# Patient Record
Sex: Male | Born: 1945
Health system: Southern US, Community
[De-identification: ages and names within clinical notes are randomized; demographics above are authoritative.]

## PROBLEM LIST (undated history)

## (undated) DIAGNOSIS — C801 Malignant (primary) neoplasm, unspecified: Secondary | ICD-10-CM

## (undated) DIAGNOSIS — E785 Hyperlipidemia, unspecified: Secondary | ICD-10-CM

## (undated) DIAGNOSIS — R030 Elevated blood-pressure reading, without diagnosis of hypertension: Secondary | ICD-10-CM

## (undated) DIAGNOSIS — E559 Vitamin D deficiency, unspecified: Secondary | ICD-10-CM

## (undated) HISTORY — DX: Hyperlipidemia, unspecified: E78.5

## (undated) HISTORY — DX: Malignant (primary) neoplasm, unspecified: C80.1

## (undated) HISTORY — DX: Vitamin D deficiency, unspecified: E55.9

## (undated) HISTORY — PX: OTHER SURGICAL HISTORY: SHX169

## (undated) HISTORY — DX: Elevated blood-pressure reading, without diagnosis of hypertension: R03.0

---

## 2001-05-09 ENCOUNTER — Emergency Department (HOSPITAL_COMMUNITY): Admission: EM | Admit: 2001-05-09 | Discharge: 2001-05-09 | Payer: Self-pay

## 2004-09-19 ENCOUNTER — Ambulatory Visit: Payer: Self-pay | Admitting: Internal Medicine

## 2004-11-23 ENCOUNTER — Ambulatory Visit: Payer: Self-pay | Admitting: Internal Medicine

## 2008-01-26 ENCOUNTER — Ambulatory Visit: Payer: Self-pay | Admitting: Internal Medicine

## 2008-01-26 DIAGNOSIS — H918X9 Other specified hearing loss, unspecified ear: Secondary | ICD-10-CM

## 2009-10-28 DIAGNOSIS — C61 Malignant neoplasm of prostate: Secondary | ICD-10-CM

## 2009-10-28 HISTORY — DX: Malignant neoplasm of prostate: C61

## 2009-10-28 HISTORY — PX: PROSTATECTOMY: SHX69

## 2010-01-29 ENCOUNTER — Ambulatory Visit: Payer: Self-pay | Admitting: Internal Medicine

## 2010-01-29 DIAGNOSIS — Z87448 Personal history of other diseases of urinary system: Secondary | ICD-10-CM

## 2010-01-29 DIAGNOSIS — H6980 Other specified disorders of Eustachian tube, unspecified ear: Secondary | ICD-10-CM

## 2010-01-29 DIAGNOSIS — R030 Elevated blood-pressure reading, without diagnosis of hypertension: Secondary | ICD-10-CM | POA: Insufficient documentation

## 2010-01-29 DIAGNOSIS — R319 Hematuria, unspecified: Secondary | ICD-10-CM

## 2010-01-29 DIAGNOSIS — H9190 Unspecified hearing loss, unspecified ear: Secondary | ICD-10-CM | POA: Insufficient documentation

## 2010-01-29 LAB — CONVERTED CEMR LAB
Glucose, Urine, Semiquant: NEGATIVE
Nitrite: NEGATIVE
Protein, U semiquant: NEGATIVE
Urobilinogen, UA: 0.2
WBC Urine, dipstick: NEGATIVE

## 2010-01-30 ENCOUNTER — Encounter: Payer: Self-pay | Admitting: Internal Medicine

## 2010-01-31 ENCOUNTER — Encounter: Payer: Self-pay | Admitting: Internal Medicine

## 2010-02-01 LAB — CONVERTED CEMR LAB: PSA: 4.4 ng/mL — ABNORMAL HIGH

## 2010-03-27 ENCOUNTER — Encounter: Payer: Self-pay | Admitting: Internal Medicine

## 2010-03-30 ENCOUNTER — Telehealth (INDEPENDENT_AMBULATORY_CARE_PROVIDER_SITE_OTHER): Payer: Self-pay | Admitting: *Deleted

## 2010-11-27 NOTE — Assessment & Plan Note (Signed)
Summary: TROUBLE URINATING/RH.....   Vital Signs:  Patient profile:   65 year old male Weight:      230 pounds Pulse rate:   72 / minute Resp:     15 per minute BP sitting:   160 / 88  (left arm) Cuff size:   large  Vitals Entered By: Shonna Chock (January 29, 2010 12:20 PM) CC: 1.) For a long time patient with trouble urinating at night off/on  2.) Seen at Urgent Care for cold, had antibiotic-now ear concerns (Left Mainly) Comments REVIEWED MED LIST, PATIENT AGREED DOSE AND INSTRUCTION CORRECT    CC:  1.) For a long time patient with trouble urinating at night off/on  2.) Seen at Urgent Care for cold and had antibiotic-now ear concerns (Left Mainly).  History of Present Illness:    Difficulty with voiding @ night as interrupted flow flow for > 1 year 3-4 X /week. No other prostate symptoms. Promegranate supplement of no benefit. No PMH of GU disease. He rides motorcycles for hours with decreased oral  fluids.                                                                                                                                 Also after URI several weeks ago he has had decreased hearing . Amox X 10 days Rxed @ UC  with resolution of  fever,head purulence but residual hoarseness with decreased hearing.He flys frequently ;this is associated with "bubbling & popping". PMH of ? Meniere's disease as per ENT.  Allergies (verified): No Known Drug Allergies  Review of Systems General:  Denies chills, fever, and sweats. ENT:  Complains of ringing in ears and sinus pressure; denies nasal congestion; No frontal headache , facial pain or purulence. Resp:  Denies cough and sputum productive. GU:  Denies discharge, dysuria, hematuria, incontinence, urinary frequency, and urinary hesitancy; Nocturia 3-3:30 am..  Physical Exam  General:  well-nourished,in no acute distress; alert,appropriate and cooperative throughout examination Ears:  External ear exam shows no significant lesions or  deformities.  Otoscopic examination reveals clear canals, tympanic membranes are intact bilaterally without bulging, retraction, inflammation or discharge. Hearing is grossly normal bilaterally. Nose:  External nasal examination shows no deformity or inflammation. Nasal mucosa are pink and moist without lesions or exudates. Septal dislocation Mouth:  Oral mucosa and oropharynx without lesions or exudates.  Teeth in good repair. Rectal:  external hemorrhoidal tags Prostate:  Upper limits of  gland enlargement and ? soft  nodule R lobe inferiorly Cervical Nodes:  No lymphadenopathy noted. ? 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 ? nodule  Orders: Urology Referral (Urology) Venipuncture [phone]) TLB-PSA (Prostate Specific Antigen) (84153-PSA)  Problem # 5:  HEMATURIA (ICD-599.70)  Microscopically  Orders: Urology Referral (Urology) Venipuncture [phone]) TLB-PSA (Prostate Specific Antigen) (84153-PSA)  Complete Medication List: 1)  Asa 81mg   .... 1 by mouth once daily 2)  Fluticasone Propionate 50 Mcg/act Susp (Fluticasone propionate) .Marland Kitchen.. 1 spray two times a day to l nostril  Other Orders: UA Dipstick w/o Micro (manual) (09811) T-Culture, Urine ([id number])  Patient Instructions: 1)  Check your Blood Pressure regularly. If it is above: 135/85 ON AVERAGE you should make an appointment.Go to Web MD for Eustachian Tube Dysfunction. Prescriptions: FLUTICASONE PROPIONATE 50 MCG/ACT SUSP (FLUTICASONE PROPIONATE) 1 spray two times a day to L nostril  #1 x 5   Entered and Authorized by:   Marga Melnick MD   Signed  by:   Marga Melnick MD on 01/29/2010   Method used:   Print then Give to Patient   RxID:   [id number] TAMSULOSIN HCL 0.4 MG CAPS (TAMSULOSIN HCL) 1 at bedtime  #30 x 0   Entered and Authorized by:   Marga Melnick MD   Signed by:   Marga Melnick MD on 01/29/2010   Method used:   Print then Give to Patient   RxID:   [id number]   Laboratory Results   Urine Tests    Routine Urinalysis   Color: yellow Appearance: Clear Glucose: negative   (Normal Range: Negative) Bilirubin: negative   (Normal Range: Negative) Ketone: negative   (Normal Range: Negative) Spec. Gravity: 1.020   (Normal Range: 1.003-1.035) Blood: large   (Normal Range: Negative) pH: 6.0   (Normal Range: 5.0-8.0) Protein: negative   (Normal Range: Negative) Urobilinogen: 0.2   (Normal Range: 0-1) Nitrite: negative   (Normal Range: Negative) Leukocyte Esterace: negative   (Normal Range: Negative)    Comments: Sent for culture

## 2010-11-27 NOTE — Progress Notes (Signed)
Summary: labwork faxed  Phone Note Call from Patient   Summary of Call: Pt called requesting that his PSA labwork be faxed to him at [phone]. Faxed over, pt aware labs were faxed. Army Fossa CMA  March 30, 2010 10:18 AM

## 2010-11-27 NOTE — Consult Note (Signed)
Summary: [facility]  [facility]   Imported By: Lanelle Bal 02/08/2010 08:36:40  _____________________________________________________________________  External Attachment:    Type:   Image     Comment:   External Document

## 2011-10-01 ENCOUNTER — Other Ambulatory Visit: Payer: Self-pay | Admitting: Internal Medicine

## 2011-10-01 ENCOUNTER — Encounter: Payer: Self-pay | Admitting: Internal Medicine

## 2011-10-01 ENCOUNTER — Ambulatory Visit: Payer: 59

## 2011-10-01 ENCOUNTER — Ambulatory Visit (INDEPENDENT_AMBULATORY_CARE_PROVIDER_SITE_OTHER): Payer: 59 | Admitting: Internal Medicine

## 2011-10-01 VITALS — BP 138/88 | HR 73 | Temp 98.2°F | Wt 223.2 lb

## 2011-10-01 DIAGNOSIS — C61 Malignant neoplasm of prostate: Secondary | ICD-10-CM | POA: Insufficient documentation

## 2011-10-01 DIAGNOSIS — I10 Essential (primary) hypertension: Secondary | ICD-10-CM

## 2011-10-01 DIAGNOSIS — E785 Hyperlipidemia, unspecified: Secondary | ICD-10-CM

## 2011-10-01 DIAGNOSIS — R030 Elevated blood-pressure reading, without diagnosis of hypertension: Secondary | ICD-10-CM

## 2011-10-01 LAB — BASIC METABOLIC PANEL
BUN: 15 mg/dL (ref 6–23)
CO2: 29 mEq/L (ref 19–32)
Chloride: 105 mEq/L (ref 96–112)
Creatinine, Ser: 0.9 mg/dL (ref 0.4–1.5)
Potassium: 4.1 mEq/L (ref 3.5–5.1)

## 2011-10-01 MED ORDER — METOPROLOL TARTRATE 25 MG PO TABS: 25.0000 mg | ORAL_TABLET | Freq: Two times a day (BID) | ORAL | Status: DC

## 2011-10-01 NOTE — Progress Notes (Signed)
Subjective:    Patient ID: Kyle Strickland, male    DOB: 08/06/1946, 65 y.o.   MRN: [id number]  HPI  HYPERTENSION:148/86 on 11/30 @ Pharmacy; he was picking up medications following cauterization for epistaxis. Disease Monitoring  Blood pressure range: not checked until today  Chest pain: no   Dyspnea: no   Claudication: no              Lightheadedness: yes, a few weeks ago with standing or turning body quickly; this resolved after he decreased his intake of candy.   Urinary frequency: no   Edema: no  Medication compliance: no, never on BP meds  Preventitive Healthcare:  Exercise: no   Diet Pattern: decreased calories (except candy as noted)  Salt Restriction: no      Review of Systems   He denies hemoptysis other than during the acute epistaxis. He also denies melena, rectal bleeding, hematuria, abnormal bruising, or difficulty stopping bleeding. He is on a low-dose aspirin daily.     Objective:   Physical Exam Gen.: Healthy and well-nourished in appearance. Alert, appropriate and cooperative throughout exam.  Eyes: No corneal or conjunctival inflammation noted. Pupils equal round reactive to light and accommodation. Fundal exam is benign without hemorrhages, exudate, papilledema. Ears:  Hearing aids bilaterally. Nose: External nasal exam reveals no deformity or inflammation. Nasal mucosa are dry; clot R septum.  Neck: No deformities, masses, or tenderness noted.  Thyroid normal. Lungs: Normal respiratory effort; chest expands symmetrically. Lungs are clear to auscultation without rales, wheezes, or increased work of breathing. Heart: Normal rate and rhythm. Normal S1 and S2. No gallop, click, or rub. S4 w/o  murmur. Abdomen: Bowel sounds normal; abdomen soft and nontender. No masses, organomegaly or hernias noted.No AAA or bruits.                                                                             Musculoskeletal/extremities: No deformity or scoliosis noted of  the  thoracic or lumbar spine but R thoracic muscles > L. No clubbing, cyanosis, edema, or deformity noted. Nail health  good. Vascular: Carotid, radial artery, dorsalis pedis and  posterior tibial pulses are full and equal. No bruits present. Neurologic: Alert and oriented x3. Deep tendon reflexes symmetrical and normal.          Skin: Intact without suspicious lesions or rashes. Lymph: No cervical, axillary  lymphadenopathy present. Psych: Mood and affect are normal. Normally interactive                                                                                         Assessment & Plan:  #1 hypertension, labile  #2 epistaxis, probably related to septal drying rather than #1  #3 dyslipidemia, definitive risk should be established  Plan: See orders and recommendations   EKG is normal with no hypertensive changes or ischemic changes.

## 2011-10-01 NOTE — Patient Instructions (Signed)
Blood Pressure Goal  Ideally is an AVERAGE < 135/85. This AVERAGE should be calculated from @ least 5-7 BP readings taken @ different times of day on different days of week. You should not respond to isolated BP readings , but rather the AVERAGE for that week  Please review Dr Gildardo Griffes book Eat, Drink & Be Healthy for dietary cholesterol information.

## 2011-10-03 LAB — NMR LIPOPROFILE WITH LIPIDS
HDL Particle Number: 27.7 umol/L — ABNORMAL LOW (ref >=30.5)
HDL-C: 52 mg/dL (ref >=40)
LDL (calc): 136 mg/dL — ABNORMAL HIGH (ref <100)
LDL Particle Number: 1598 nmol/L — ABNORMAL HIGH (ref <1000)

## 2011-10-24 ENCOUNTER — Encounter: Payer: Self-pay | Admitting: Internal Medicine

## 2012-09-12 ENCOUNTER — Ambulatory Visit (INDEPENDENT_AMBULATORY_CARE_PROVIDER_SITE_OTHER): Payer: 59 | Admitting: Family Medicine

## 2012-09-12 ENCOUNTER — Encounter: Payer: Self-pay | Admitting: Family Medicine

## 2012-09-12 VITALS — BP 124/80 | HR 73 | Temp 98.2°F | Wt 237.0 lb

## 2012-09-12 DIAGNOSIS — R319 Hematuria, unspecified: Secondary | ICD-10-CM

## 2012-09-12 DIAGNOSIS — N390 Urinary tract infection, site not specified: Secondary | ICD-10-CM | POA: Insufficient documentation

## 2012-09-12 LAB — POCT URINALYSIS DIPSTICK
Bilirubin, UA: NEGATIVE
Glucose, UA: NEGATIVE
Ketones, UA: NEGATIVE
Leukocytes, UA: NEGATIVE

## 2012-09-12 MED ORDER — SULFAMETHOXAZOLE-TRIMETHOPRIM 800-160 MG PO TABS: ORAL_TABLET | ORAL | Status: DC

## 2012-09-12 NOTE — Progress Notes (Signed)
OFFICE NOTE  09/12/2012  CC:  Chief Complaint  Patient presents with  . Dysuria    x 2 weeks  . Hematuria    x 1 day     HPI: Patient is a 66 y.o. Caucasian male who is here for 4-5d of urinary urgency. Notes right pelvic burning a few seconds at the end of each urination.  No obstructive sx's except for this morning briefly and then this was quickly relieved with passing of what he describes as a small blood clot.  No nausea or fever.  No malaise.    Pertinent PMH:  Past Medical History  Diagnosis Date  . Cancer 2011    prostate   Past Surgical History  Procedure Date  . Prostatectomy 2011    Dr Carin Primrose, [place]  Note: Last PSA 0.002 August 2013.  MEDS:  Outpatient Prescriptions Prior to Visit  Medication Sig Dispense Refill  . Cholecalciferol (VITAMIN D3) 1000 UNITS CAPS Take by mouth daily.        . Multiple Vitamin (MULTIVITAMINS PO) Take by mouth daily.        . metoprolol tartrate (LOPRESSOR) 25 MG tablet Take 1 tablet (25 mg total) by mouth 2 (two) times daily.  60 tablet  11   Last reviewed on 09/12/2012 12:49 PM by Jeoffrey Massed, MD  PE: Blood pressure 124/80, pulse 73, temperature 98.2 F (36.8 C), temperature source Oral, weight 237 lb (107.502 kg), SpO2 97.00%. Gen: Alert, well appearing.  Patient is oriented to person, place, time, and situation. CV: RRR, no m/r/g.   LUNGS: CTA bilat, nonlabored resps, good aeration in all lung fields. ABD: soft, NT, ND, BS normal.   LAB: CC UA today showed moderate blood, otherwise normal.  IMPRESSION AND PLAN:  UTI (lower urinary tract infection) Send urine for c/s. Start bactrim DS 1 bid x 5d. Call MD or return if all sx's not resolved after the antibiotics are finished.   An After Visit Summary was printed and given to the patient.  FOLLOW UP: prn

## 2012-09-12 NOTE — Assessment & Plan Note (Signed)
Send urine for c/s. Start bactrim DS 1 bid x 5d. Call MD or return if all sx's not resolved after the antibiotics are finished.

## 2012-09-13 LAB — URINE CULTURE
Colony Count: NO GROWTH
Organism ID, Bacteria: NO GROWTH

## 2013-02-12 ENCOUNTER — Encounter: Payer: Self-pay | Admitting: Internal Medicine

## 2013-02-12 ENCOUNTER — Ambulatory Visit (INDEPENDENT_AMBULATORY_CARE_PROVIDER_SITE_OTHER): Payer: 59 | Admitting: Internal Medicine

## 2013-02-12 VITALS — BP 140/84 | HR 86 | Temp 98.2°F | Ht 70.0 in | Wt 231.0 lb

## 2013-02-12 DIAGNOSIS — Z0000 Encounter for general adult medical examination without abnormal findings: Secondary | ICD-10-CM

## 2013-02-12 DIAGNOSIS — E785 Hyperlipidemia, unspecified: Secondary | ICD-10-CM

## 2013-02-12 DIAGNOSIS — I10 Essential (primary) hypertension: Secondary | ICD-10-CM

## 2013-02-12 LAB — BASIC METABOLIC PANEL
CO2: 27 mEq/L (ref 19–32)
Chloride: 104 mEq/L (ref 96–112)
Potassium: 3.8 mEq/L (ref 3.5–5.1)
Sodium: 137 mEq/L (ref 135–145)

## 2013-02-12 LAB — HEPATIC FUNCTION PANEL
ALT: 33 U/L (ref 0–53)
Albumin: 3.8 g/dL (ref 3.5–5.2)
Alkaline Phosphatase: 87 U/L (ref 39–117)
Total Protein: 7.2 g/dL (ref 6.0–8.3)

## 2013-02-12 LAB — CBC WITH DIFFERENTIAL/PLATELET
Basophils Relative: 0.4 % (ref 0.0–3.0)
Eosinophils Absolute: 0.2 10*3/uL (ref 0.0–0.7)
Eosinophils Relative: 3 % (ref 0.0–5.0)
HCT: 43.5 % (ref 39.0–52.0)
Hemoglobin: 14.6 g/dL (ref 13.0–17.0)
MCHC: 33.5 g/dL (ref 30.0–36.0)
MCV: 93.3 fl (ref 78.0–100.0)
Monocytes Absolute: 1 10*3/uL (ref 0.1–1.0)
Neutro Abs: 3.2 10*3/uL (ref 1.4–7.7)
RBC: 4.66 Mil/uL (ref 4.22–5.81)
WBC: 5.1 10*3/uL (ref 4.5–10.5)

## 2013-02-12 LAB — LIPID PANEL
HDL: 30.2 mg/dL — ABNORMAL LOW (ref >39.00)
Total CHOL/HDL Ratio: 6
Triglycerides: 135 mg/dL (ref 0.0–149.0)

## 2013-02-12 LAB — TSH: TSH: 0.88 u[IU]/mL (ref 0.35–5.50)

## 2013-02-12 NOTE — Progress Notes (Signed)
  Subjective:    Patient ID: Kyle Strickland, male    DOB: 10-Sep-1946, 67 y.o.   MRN: [id number]  HPI  He is here for a physical;acute issues include BPV symptoms.     Review of Systems For approximately a year he's noted brief dizziness while in the right lateral decubitus position in bed. He has no postural symptoms. He denies significant headache, limb weakness, or numbness other than in the toes initially in the morning. That resolves with mobilization.  He wears hearing aids bilaterally; he has chronic tinnitus.  To date he is not had a colonoscopy; he expresses trepidation about the procedure. Standard of care was reviewed. He denies abdominal pain, unexplained weight loss, melena, or rectal bleeding.            Objective:   Physical Exam Gen.:  well-nourished in appearance. Alert, appropriate and cooperative throughout exam.  Head: Normocephalic without obvious abnormalities; patternalopecia  Eyes: No corneal or conjunctival inflammation noted. Pupils equal round reactive to light and accommodation. Fundal exam is benign without hemorrhages, exudate, papilledema. Extraocular motion intact. Vision grossly normal with lenses Ears: External  ear exam reveals no significant lesions or deformities. Canals excessively dry with exfoliation .TMs normal. Hearing is grossly normal bilaterally. Nose: External nasal exam reveals no deformity or inflammation. Nasal mucosa are pink and moist. No lesions or exudates noted. Septum  Slightly dislocated & deviated Mouth: Oral mucosa and oropharynx reveal no lesions or exudates. Teeth in good repair. Neck: No deformities, masses, or tenderness noted. Range of motion & Thyroid normal. Lungs: Normal respiratory effort; chest expands symmetrically. Lungs are clear to auscultation without rales, wheezes, or increased work of breathing. Heart: Normal rate and rhythm. Normal S1 and S2. No gallop, click, or rub. No murmur. Abdomen: Bowel sounds normal;  abdomen soft and nontender. No masses, organomegaly or hernias noted. Genitalia: As per Dr Carin Primrose, [facility]                                 Musculoskeletal/extremities: There is some asymmetry of the posterior thoracic musculature suggesting occult scoliosis. No clubbing, cyanosis, edema, or significant extremity  deformity noted. Range of motion normal .Tone & strength  Normal. Joints normal . Nail health good. Able to lie down & sit up w/o help. Negative SLR bilaterally Vascular: Carotid, radial artery, dorsalis pedis and  posterior tibial pulses are full and equal. No bruits present. Neurologic: Alert and oriented x3. Deep tendon reflexes symmetrical and normal.      Skin: Intact without suspicious lesions or rashes. Lymph: No cervical, axillary lymphadenopathy present. Psych: Mood and affect are normal. Normally interactive                                                                                      Assessment & Plan:  #1 comprehensive physical exam; no acute findings  Plan: see Orders  & Recommendations

## 2013-02-12 NOTE — Progress Notes (Signed)
  Subjective:    Patient ID: Kyle Strickland, male    DOB: Oct 16, 1946, 67 y.o.   MRN: [id number]  HPI    Review of Systems     Objective:   Physical Exam        Assessment & Plan:  BPV Referral to PT if persists or progresses

## 2013-02-12 NOTE — Patient Instructions (Addendum)
Preventive Health Care: Exercise at least 30-45 minutes a day,  3-4 days a week.  Eat a low-fat diet with lots of fruits and vegetables, up to 7-9 servings per day. This would eliminate the need for vitamin supplements. Consume less than 40 grams of sugar (preferably ZERO) per day from foods & drinks with High Fructose Corn Sugar as #1,2,3 or # 4 on label. As per the Standard of Care , screening Colonoscopy recommended @ 50 & every 5-10 years thereafter . More frequent monitor would be dictated by family history or findings @ Colonoscopy. Minimal Blood Pressure Goal= AVERAGE < 140/90;  Ideal is an AVERAGE < 135/85. This AVERAGE should be calculated from @ least 5-7 BP readings taken @ different times of day on different days of week. You should not respond to isolated BP readings , but rather the AVERAGE for that week .Please bring your  blood pressure cuff to office visits to verify that it is reliable.It  can also be checked against the blood pressure device at the pharmacy. Finger or wrist cuffs are not dependable; an arm cuff is. To prevent palpitations or premature beats, avoid stimulants such as decongestants, diet pills, nicotine, or caffeine (coffee, tea, cola, or chocolate) to excess.  If you activate the  My Chart system; lab & Xray results will be released directly  to you as soon as I review & address these through the computer. If you choose not to sign up for My Chart within 36 hours of labs being drawn; results will be reviewed & interpretation added before being copied & mailed, causing a delay in getting the results to you.If you do not receive that report within 7-10 days ,please call. Additionally you can use this system to gain direct  access to your records  if  out of town or @ an office of a  physician who is not in  the My Chart network.  This improves continuity of care & places you in control of your medical record.

## 2013-02-16 ENCOUNTER — Ambulatory Visit: Payer: 59

## 2013-02-16 DIAGNOSIS — R7309 Other abnormal glucose: Secondary | ICD-10-CM

## 2015-05-26 ENCOUNTER — Other Ambulatory Visit (INDEPENDENT_AMBULATORY_CARE_PROVIDER_SITE_OTHER): Payer: 59

## 2015-05-26 ENCOUNTER — Ambulatory Visit (INDEPENDENT_AMBULATORY_CARE_PROVIDER_SITE_OTHER): Payer: 59 | Admitting: Internal Medicine

## 2015-05-26 ENCOUNTER — Encounter: Payer: Self-pay | Admitting: Internal Medicine

## 2015-05-26 VITALS — BP 156/92 | HR 66 | Temp 97.8°F | Resp 16 | Ht 70.5 in | Wt 239.0 lb

## 2015-05-26 DIAGNOSIS — Z0000 Encounter for general adult medical examination without abnormal findings: Secondary | ICD-10-CM

## 2015-05-26 DIAGNOSIS — E785 Hyperlipidemia, unspecified: Secondary | ICD-10-CM

## 2015-05-26 DIAGNOSIS — Z0189 Encounter for other specified special examinations: Secondary | ICD-10-CM

## 2015-05-26 DIAGNOSIS — Z23 Encounter for immunization: Secondary | ICD-10-CM

## 2015-05-26 DIAGNOSIS — R030 Elevated blood-pressure reading, without diagnosis of hypertension: Secondary | ICD-10-CM

## 2015-05-26 DIAGNOSIS — I4510 Unspecified right bundle-branch block: Secondary | ICD-10-CM

## 2015-05-26 LAB — CBC WITH DIFFERENTIAL/PLATELET
BASOS PCT: 0.6 % (ref 0.0–3.0)
Basophils Absolute: 0 10*3/uL (ref 0.0–0.1)
Eosinophils Absolute: 0.3 10*3/uL (ref 0.0–0.7)
Eosinophils Relative: 4.8 % (ref 0.0–5.0)
HCT: 42.2 % (ref 39.0–52.0)
Hemoglobin: 14.5 g/dL (ref 13.0–17.0)
Lymphocytes Relative: 21.4 % (ref 12.0–46.0)
Lymphs Abs: 1.4 10*3/uL (ref 0.7–4.0)
MCHC: 34.4 g/dL (ref 30.0–36.0)
MCV: 92.8 fl (ref 78.0–100.0)
MONO ABS: 1 10*3/uL (ref 0.1–1.0)
MONOS PCT: 15.9 % — AB (ref 3.0–12.0)
NEUTROS ABS: 3.8 10*3/uL (ref 1.4–7.7)
NEUTROS PCT: 57.3 % (ref 43.0–77.0)
Platelets: 206 10*3/uL (ref 150.0–400.0)
RBC: 4.54 Mil/uL (ref 4.22–5.81)
RDW: 13.6 % (ref 11.5–15.5)
WBC: 6.6 10*3/uL (ref 4.0–10.5)

## 2015-05-26 LAB — BASIC METABOLIC PANEL
BUN: 15 mg/dL (ref 6–23)
CHLORIDE: 104 meq/L (ref 96–112)
CO2: 30 mEq/L (ref 19–32)
Calcium: 10.2 mg/dL (ref 8.4–10.5)
Creatinine, Ser: 0.94 mg/dL (ref 0.40–1.50)
GFR: 84.4 mL/min (ref >60.00)
Glucose, Bld: 104 mg/dL — ABNORMAL HIGH (ref 70–99)
Potassium: 4.6 mEq/L (ref 3.5–5.1)
Sodium: 139 mEq/L (ref 135–145)

## 2015-05-26 LAB — HEPATIC FUNCTION PANEL
ALK PHOS: 85 U/L (ref 39–117)
ALT: 24 U/L (ref 0–53)
AST: 17 U/L (ref 0–37)
Albumin: 4 g/dL (ref 3.5–5.2)
Bilirubin, Direct: 0.2 mg/dL (ref 0.0–0.3)
Total Bilirubin: 0.7 mg/dL (ref 0.2–1.2)
Total Protein: 7.2 g/dL (ref 6.0–8.3)

## 2015-05-26 LAB — LIPID PANEL
CHOLESTEROL: 195 mg/dL (ref 0–200)
HDL: 36.6 mg/dL — ABNORMAL LOW (ref >39.00)
LDL CALC: 125 mg/dL — AB (ref 0–99)
NonHDL: 158.56
TRIGLYCERIDES: 168 mg/dL — AB (ref 0.0–149.0)
Total CHOL/HDL Ratio: 5
VLDL: 33.6 mg/dL (ref 0.0–40.0)

## 2015-05-26 LAB — TSH: TSH: 1.43 u[IU]/mL (ref 0.35–4.50)

## 2015-05-26 MED ORDER — METOPROLOL TARTRATE 25 MG PO TABS: 25.0000 mg | ORAL_TABLET | Freq: Two times a day (BID) | ORAL | Status: DC

## 2015-05-26 NOTE — Patient Instructions (Addendum)
Minimal Blood Pressure Goal= AVERAGE < 140/90;  Ideal is an AVERAGE < 135/85. This AVERAGE should be calculated from @ least 5-7 BP readings taken @ different times of day on different days of week. You should not respond to isolated BP readings , but rather the AVERAGE for that week .Please bring your  blood pressure cuff to office visits to verify that it is reliable.It  can also be checked against the blood pressure device at the pharmacy. Finger or wrist cuffs are not dependable; an arm cuff is.  Fill the  prescription for the BP medication if BP NOT @ goal based on  7 to 14 day average.    Cardiovascular exercise, this can be as simple a program as walking, is recommended 30-45 minutes 3-4 times per week. If you're not exercising you should take 6-8 weeks to build up to this level.   Reflux of gastric acid may be asymptomatic as this may occur mainly during sleep.The triggers for reflux  include stress; the "aspirin family" ; alcohol; peppermint; and caffeine (coffee, tea, cola, and chocolate). The aspirin family would include aspirin and the nonsteroidal agents such as ibuprofen &  Naproxen. Tylenol would not cause reflux. If having symptoms ; food & drink should be avoided for @ least 2 hours before going to bed.   As per the Standard of Care , screening Colonoscopy recommended @ 50 & every 5-10 years thereafter . More frequent monitor would be dictated by family history or findings @ Colonoscopy.   Because of risks involved as we discussed;please allow me to refer you to gastroenterology to evaluate the dysphagia and to consider screening colonoscopy.

## 2015-05-26 NOTE — Progress Notes (Signed)
Patient received education resource, including the self-management goal and tool. Patient verbalized understanding. 

## 2015-05-26 NOTE — Progress Notes (Signed)
   Subjective:    Patient ID: Kyle Strickland, male    DOB: 1946/01/20, 69 y.o.   MRN: [id number]  HPI He is here for a physical;acute issues denied.  He never filled the metoprolol. He is not monitoring blood pressure. He does avoid salt but eats fried foods and some red meat. He walks 1 day a week for 35 minutes. With such he does have some calf discomfort occasionally.  He will have 1-2 drinks per month. He's never smoked.  Review of Systems  He occasionally has some calf discomfort with walking.  He also has occasional dysphasia if he eats a sandwich rapidly. This occurs 1-2 times per month.   He also has nocturia once nightly. He has follow up scheduled with his Urologist in the near future.  He has intermittent numbness in toes but no other neurologic symptoms.  Chest pain, palpitations, tachycardia, exertional dyspnea, paroxysmal nocturnal dyspnea, or edema are absent. No unexplained weight loss, abdominal pain, significant dyspepsia,  melena, rectal bleeding, or persistently small caliber stools. Dysuria, pyuria, hematuria, frequency, or polyuria are denied. Change in hair, skin, nails denied. No bowel changes of constipation or . No intolerance to heat or cold.      Objective:   Physical Exam  Pertinent or positive findings include: Pattern alopecia is present. He is wearing hearing aids bilaterally. Ptosis is noted bilaterally. He has a benign nevus over the left forehead. Abdomen is protuberant. He has slight crepitus of the knees.  General appearance :adequately nourished; in no distress. BMI 33.8.  Eyes: No conjunctival inflammation or scleral icterus is present.  Oral exam:  Lips and gums are healthy appearing.There is no oropharyngeal erythema or exudate noted. Dental hygiene is good.  Heart:  Normal rate and regular rhythm. S1 and S2 normal without gallop, murmur, click, rub or other extra sounds    Lungs:Chest clear to auscultation; no wheezes, rhonchi,rales ,or  rubs present.No increased work of breathing.   Abdomen: bowel sounds normal, soft and non-tender without masses, organomegaly or hernias noted.  No guarding or rebound.   Vascular : all pulses equal ; no bruits present.  Skin:Warm & dry.  Intact without suspicious lesions or rashes ; no tenting or jaundice   Lymphatic: No lymphadenopathy is noted about the head, neck, axilla  Neuro: Strength, tone & DTRs normal.        Assessment & Plan:  #1 comprehensive physical exam  #2 hypertension  #3 dysphagia  #4 possible claudication  #5 no colonoscopy to date; [facility] reviewed  Plan: see Orders  & Recommendations

## 2015-05-29 ENCOUNTER — Other Ambulatory Visit (INDEPENDENT_AMBULATORY_CARE_PROVIDER_SITE_OTHER): Payer: 59

## 2015-05-29 DIAGNOSIS — R739 Hyperglycemia, unspecified: Secondary | ICD-10-CM

## 2015-05-29 LAB — HEMOGLOBIN A1C: Hgb A1c MFr Bld: 5.7 % (ref 4.6–6.5)

## 2016-11-18 ENCOUNTER — Ambulatory Visit (INDEPENDENT_AMBULATORY_CARE_PROVIDER_SITE_OTHER): Payer: 59 | Admitting: Nurse Practitioner

## 2016-11-18 ENCOUNTER — Encounter: Payer: Self-pay | Admitting: Nurse Practitioner

## 2016-11-18 VITALS — BP 138/74 | HR 84 | Temp 99.6°F | Ht 71.0 in | Wt 236.0 lb

## 2016-11-18 DIAGNOSIS — J111 Influenza due to unidentified influenza virus with other respiratory manifestations: Secondary | ICD-10-CM

## 2016-11-18 LAB — POCT INFLUENZA A/B
INFLUENZA A, POC: POSITIVE — AB
Influenza B, POC: POSITIVE — AB

## 2016-11-18 MED ORDER — IPRATROPIUM BROMIDE 0.03 % NA SOLN: 2.0000 | Freq: Two times a day (BID) | NASAL | 0 refills | Status: DC

## 2016-11-18 MED ORDER — OSELTAMIVIR PHOSPHATE 75 MG PO CAPS: 75.0000 mg | ORAL_CAPSULE | Freq: Two times a day (BID) | ORAL | 0 refills | Status: DC

## 2016-11-18 MED ORDER — PROMETHAZINE-DM 6.25-15 MG/5ML PO SYRP: 5.0000 mL | ORAL_SOLUTION | Freq: Three times a day (TID) | ORAL | 0 refills | Status: DC | PRN

## 2016-11-18 NOTE — Progress Notes (Signed)
Pre visit review using our clinic review tool, if applicable. No additional management support is needed unless otherwise documented below in the visit note. 

## 2016-11-18 NOTE — Progress Notes (Signed)
Reviewed with patient in office. See office note

## 2016-11-18 NOTE — Progress Notes (Signed)
Subjective:  Patient ID: Kyle Strickland, male    DOB: 1946-01-16  Age: 71 y.o. MRN: [id number]  CC: Cough (cough,bodyaache going on for 2 days. took tylanol PM,oscillococinum and sambucol)   URI   This is a new problem. The current episode started yesterday. The problem has been gradually worsening. The maximum temperature recorded prior to his arrival was 100.4 - 100.9 F. Associated symptoms include congestion, coughing, headaches, joint pain, a plugged ear sensation, rhinorrhea, sinus pain, sneezing, a sore throat and swollen glands. Pertinent negatives include no chest pain, diarrhea, dysuria, ear pain, joint swelling, nausea, neck pain, vomiting or wheezing. He has tried acetaminophen, increased fluids and sleep for the symptoms. The treatment provided no relief.    Outpatient Medications Prior to Visit  Medication Sig Dispense Refill  . aspirin 81 MG tablet Take 81 mg by mouth daily.    . Cholecalciferol (VITAMIN D3) 1000 UNITS CAPS Take by mouth daily.      . fish oil-omega-3 fatty acids 1000 MG capsule Take 1 g by mouth daily.    . Magnesium 400 MG CAPS Take 1 tablet by mouth daily.    . Multiple Vitamin (MULTIVITAMINS PO) Take by mouth daily.      . metoprolol tartrate (LOPRESSOR) 25 MG tablet Take 1 tablet (25 mg total) by mouth 2 (two) times daily. 60 tablet 5   No facility-administered medications prior to visit.     ROS See HPI  Objective:  BP 138/74   Pulse 84   Temp 99.6 F (37.6 C)   Ht 5\' 11"  (1.803 m)   Wt 236 lb (107 kg)   SpO2 96%   BMI 32.92 kg/m   BP Readings from Last 3 Encounters:  11/18/16 138/74  05/26/15 (!) 156/92  02/12/13 140/84    Wt Readings from Last 3 Encounters:  11/18/16 236 lb (107 kg)  05/26/15 239 lb (108.4 kg)  02/12/13 231 lb (104.8 kg)    Physical Exam  Constitutional: He is oriented to person, place, and time. No distress.  HENT:  Right Ear: Tympanic membrane, external ear and ear canal normal.  Left Ear: Tympanic membrane  and ear canal normal.  Nose: Mucosal edema and rhinorrhea present. Right sinus exhibits maxillary sinus tenderness and frontal sinus tenderness. Left sinus exhibits maxillary sinus tenderness and frontal sinus tenderness.  Mouth/Throat: Uvula is midline. Posterior oropharyngeal erythema present. No oropharyngeal exudate.  Eyes: No scleral icterus.  Neck: Normal range of motion. Neck supple.  Cardiovascular: Normal rate and regular rhythm.   Pulmonary/Chest: Effort normal and breath sounds normal.  Lymphadenopathy:    He has no cervical adenopathy.  Neurological: He is alert and oriented to person, place, and time.  Vitals reviewed.   Lab Results  Component Value Date   WBC 6.6 05/26/2015   HGB 14.5 05/26/2015   HCT 42.2 05/26/2015   PLT 206.0 05/26/2015   GLUCOSE 104 (H) 05/26/2015   CHOL 195 05/26/2015   TRIG 168.0 (H) 05/26/2015   HDL 36.60 (L) 05/26/2015   LDLCALC 125 (H) 05/26/2015   ALT 24 05/26/2015   AST 17 05/26/2015   NA 139 05/26/2015   K 4.6 05/26/2015   CL 104 05/26/2015   CREATININE 0.94 05/26/2015   BUN 15 05/26/2015   CO2 30 05/26/2015   TSH 1.43 05/26/2015   PSA 4.40 (H) 01/29/2010   HGBA1C 5.7 05/29/2015    No results found.  Assessment & Plan:   Kyle Strickland was seen today for cough.  Diagnoses and  all orders for this visit:  Influenza -     POCT Influenza A/B -     oseltamivir (TAMIFLU) 75 MG capsule; Take 1 capsule (75 mg total) by mouth 2 (two) times daily. -     promethazine-dextromethorphan (PROMETHAZINE-DM) 6.25-15 MG/5ML syrup; Take 5 mLs by mouth 3 (three) times daily as needed for cough. -     ipratropium (ATROVENT) 0.03 % nasal spray; Place 2 sprays into both nostrils 2 (two) times daily. Do not use for more than 5days.   I am having Kyle Strickland start on oseltamivir, promethazine-dextromethorphan, and ipratropium. I am also having him maintain his Vitamin D3, Multiple Vitamin (MULTIVITAMINS PO), aspirin, fish oil-omega-3 fatty acids, Magnesium,  and metoprolol tartrate.  Meds ordered this encounter  Medications  . oseltamivir (TAMIFLU) 75 MG capsule    Sig: Take 1 capsule (75 mg total) by mouth 2 (two) times daily.    Dispense:  10 capsule    Refill:  0    Order Specific Question:   Supervising Provider    Answer:   Cassandria Anger [[id number]]  . promethazine-dextromethorphan (PROMETHAZINE-DM) 6.25-15 MG/5ML syrup    Sig: Take 5 mLs by mouth 3 (three) times daily as needed for cough.    Dispense:  240 mL    Refill:  0    Order Specific Question:   Supervising Provider    Answer:   Cassandria Anger [[id number]]  . ipratropium (ATROVENT) 0.03 % nasal spray    Sig: Place 2 sprays into both nostrils 2 (two) times daily. Do not use for more than 5days.    Dispense:  30 mL    Refill:  0    Order Specific Question:   Supervising Provider    Answer:   Cassandria Anger [[id number]]    Follow-up: Return if symptoms worsen or fail to improve.  Wilfred Lacy, NP

## 2016-11-18 NOTE — Patient Instructions (Signed)
URI Instructions: Encourage adequate oral hydration.  Use over-the-counter  "cold" medicines  such as "Tylenol cold" , "Advil cold",  "Mucinex" or" Mucinex D"  for cough and congestion.  Avoid decongestants if you have high blood pressure. Use" Delsym" or" Robitussin" cough syrup varietis for cough.  You can use plain "Tylenol" or "Advi"l for fever, chills and achyness.   "Common cold" symptoms are usually triggered by a virus.  The antibiotics are usually not necessary. On average, a" viral cold" illness would take 4-7 days to resolve. Please, make an appointment if you are not better or if you're worse.   Influenza, Adult Influenza, more commonly known as "the flu," is a viral infection that primarily affects the respiratory tract. The respiratory tract includes organs that help you breathe, such as the lungs, nose, and throat. The flu causes many common cold symptoms, as well as a high fever and body aches. The flu spreads easily from person to person (is contagious). Getting a flu shot (influenza vaccination) every year is the best way to prevent influenza. What are the causes? Influenza is caused by a virus. You can catch the virus by:  Breathing in droplets from an infected person's cough or sneeze.  Touching something that was recently contaminated with the virus and then touching your mouth, nose, or eyes. What increases the risk? The following factors may make you more likely to get the flu:  Not cleaning your hands frequently with soap and water or alcohol-based hand sanitizer.  Having close contact with many people during cold and flu season.  Touching your mouth, eyes, or nose without washing or sanitizing your hands first.  Not drinking enough fluids or not eating a healthy diet.  Not getting enough sleep or exercise.  Being under a high amount of stress.  Not getting a yearly (annual) flu shot. You may be at a higher risk of complications from the flu, such as a  severe lung infection (pneumonia), if you:  Are over the age of 68.  Are pregnant.  Have a weakened disease-fighting system (immune system). You may have a weakened immune system if you:  Have HIV or AIDS.  Are undergoing chemotherapy.  Aretaking medicines that reduce the activity of (suppress) the immune system.  Have a long-term (chronic) illness, such as heart disease, kidney disease, diabetes, or lung disease.  Have a liver disorder.  Are obese.  Have anemia. What are the signs or symptoms? Symptoms of this condition typically last 4-10 days and may include:  Fever.  Chills.  Headache, body aches, or muscle aches.  Sore throat.  Cough.  Runny or congested nose.  Chest discomfort and cough.  Poor appetite.  Weakness or tiredness (fatigue).  Dizziness.  Nausea or vomiting. How is this diagnosed? This condition may be diagnosed based on your medical history and a physical exam. Your health care provider may do a nose or throat swab test to confirm the diagnosis. How is this treated? If influenza is detected early, you can be treated with antiviral medicine that can reduce the length of your illness and the severity of your symptoms. This medicine may be given by mouth (orally) or through an IV tube that is inserted in one of your veins. The goal of treatment is to relieve symptoms by taking care of yourself at home. This may include taking over-the-counter medicines, drinking plenty of fluids, and adding humidity to the air in your home. In some cases, influenza goes away on its own. Severe  influenza or complications from influenza may be treated in a hospital. Follow these instructions at home:  Take over-the-counter and prescription medicines only as told by your health care provider.  Use a cool mist humidifier to add humidity to the air in your home. This can make breathing easier.  Rest as needed.  Drink enough fluid to keep your urine clear or pale  yellow.  Cover your mouth and nose when you cough or sneeze.  Wash your hands with soap and water often, especially after you cough or sneeze. If soap and water are not available, use hand sanitizer.  Stay home from work or school as told by your health care provider. Unless you are visiting your health care provider, try to avoid leaving home until your fever has been gone for 24 hours without the use of medicine.  Keep all follow-up visits as told by your health care provider. This is important. How is this prevented?  Getting an annual flu shot is the best way to avoid getting the flu. You may get the flu shot in late summer, fall, or winter. Ask your health care provider when you should get your flu shot.  Wash your hands often or use hand sanitizer often.  Avoid contact with people who are sick during cold and flu season.  Eat a healthy diet, drink plenty of fluids, get enough sleep, and exercise regularly. Contact a health care provider if:  You develop new symptoms.  You have:  Chest pain.  Diarrhea.  A fever.  Your cough gets worse.  You produce more mucus.  You feel nauseous or you vomit. Get help right away if:  You develop shortness of breath or difficulty breathing.  Your skin or nails turn a bluish color.  You have severe pain or stiffness in your neck.  You develop a sudden headache or sudden pain in your face or ear.  You cannot stop vomiting. This information is not intended to replace advice given to you by your health care provider. Make sure you discuss any questions you have with your health care provider. Document Released: 10/11/2000 Document Revised: 03/21/2016 Document Reviewed: 08/08/2015 Elsevier Interactive Patient Education  2017 [employer].

## 2016-12-06 ENCOUNTER — Ambulatory Visit (INDEPENDENT_AMBULATORY_CARE_PROVIDER_SITE_OTHER): Payer: 59 | Admitting: Family

## 2016-12-06 ENCOUNTER — Other Ambulatory Visit (INDEPENDENT_AMBULATORY_CARE_PROVIDER_SITE_OTHER): Payer: 59

## 2016-12-06 ENCOUNTER — Encounter: Payer: Self-pay | Admitting: Family

## 2016-12-06 VITALS — BP 128/82 | HR 66 | Temp 98.2°F | Resp 16 | Ht 71.0 in | Wt 241.0 lb

## 2016-12-06 DIAGNOSIS — Z6830 Body mass index (BMI) 30.0-30.9, adult: Secondary | ICD-10-CM

## 2016-12-06 DIAGNOSIS — E6609 Other obesity due to excess calories: Secondary | ICD-10-CM

## 2016-12-06 DIAGNOSIS — E669 Obesity, unspecified: Secondary | ICD-10-CM | POA: Insufficient documentation

## 2016-12-06 DIAGNOSIS — Z0000 Encounter for general adult medical examination without abnormal findings: Secondary | ICD-10-CM

## 2016-12-06 DIAGNOSIS — Z23 Encounter for immunization: Secondary | ICD-10-CM | POA: Diagnosis not present

## 2016-12-06 LAB — CBC
HEMATOCRIT: 42.8 % (ref 39.0–52.0)
HEMOGLOBIN: 14.6 g/dL (ref 13.0–17.0)
MCHC: 34.1 g/dL (ref 30.0–36.0)
MCV: 92.9 fl (ref 78.0–100.0)
PLATELETS: 222 10*3/uL (ref 150.0–400.0)
RBC: 4.6 Mil/uL (ref 4.22–5.81)
RDW: 12.9 % (ref 11.5–15.5)
WBC: 5.9 10*3/uL (ref 4.0–10.5)

## 2016-12-06 LAB — LIPID PANEL
CHOLESTEROL: 200 mg/dL (ref 0–200)
HDL: 40.7 mg/dL (ref >39.00)
LDL CALC: 120 mg/dL — AB (ref 0–99)
NonHDL: 159.58
TRIGLYCERIDES: 200 mg/dL — AB (ref 0.0–149.0)
Total CHOL/HDL Ratio: 5
VLDL: 40 mg/dL (ref 0.0–40.0)

## 2016-12-06 LAB — COMPREHENSIVE METABOLIC PANEL
ALBUMIN: 4.1 g/dL (ref 3.5–5.2)
ALT: 27 U/L (ref 0–53)
AST: 17 U/L (ref 0–37)
Alkaline Phosphatase: 81 U/L (ref 39–117)
BUN: 16 mg/dL (ref 6–23)
CALCIUM: 9.8 mg/dL (ref 8.4–10.5)
CHLORIDE: 105 meq/L (ref 96–112)
CO2: 29 mEq/L (ref 19–32)
CREATININE: 0.96 mg/dL (ref 0.40–1.50)
GFR: 82.01 mL/min (ref >60.00)
Glucose, Bld: 113 mg/dL — ABNORMAL HIGH (ref 70–99)
POTASSIUM: 4.7 meq/L (ref 3.5–5.1)
Sodium: 139 mEq/L (ref 135–145)
Total Bilirubin: 0.7 mg/dL (ref 0.2–1.2)
Total Protein: 6.8 g/dL (ref 6.0–8.3)

## 2016-12-06 LAB — HEPATITIS C ANTIBODY: HCV Ab: NEGATIVE

## 2016-12-06 LAB — PSA: PSA: 0.01 ng/mL — ABNORMAL LOW (ref 0.10–4.00)

## 2016-12-06 NOTE — Assessment & Plan Note (Signed)
1) Anticipatory Guidance: Discussed importance of wearing a seatbelt while driving and not texting while driving; changing batteries in smoke detector at least once annually; wearing suntan lotion when outside; eating a balanced and moderate diet; getting physical activity at least 30 minutes per day.  2) Immunizations / Screenings / Labs:  Pneumovax updated today. Discussed plan for Zostavax. Declines tetanus. All other immunizations are up-to-date per recommendations. Obtain PSA for prostate cancer screening. Obtain hepatitis C antibody for hepatitis C screening. Due for a dental exam encouraged to be completed independently. All other screenings are up-to-date per recommendations.Otain CBC, CMET, and lipid profile.    Overall well exam with risk factors for cardiovascular disease including hyperlipidemia and obesity. Recommend weight loss of 5-10% of current body weight through nutrition and physical activity. He does walk somewhat work with encouragement for structured exercise program outside of work. He continues to work in [employer]. Continue other healthy lifestyle behaviors and choices. Follow-up prevention exam in 1 year. Follow-up office visit pending blood work for chronic conditions as needed.

## 2016-12-06 NOTE — Assessment & Plan Note (Signed)
BMI of 33. Recommend weight loss of 5-10% of current body weight. Recommend increasing physical activity to 30 minutes of moderate level activity daily. Encourage nutritional intake that focuses on nutrient dense foods and is moderate, varied, and balanced and is low in saturated fats and processed/sugary foods. Continue to monitor.   

## 2016-12-06 NOTE — Patient Instructions (Addendum)
Thank you for choosing [facility].  SUMMARY AND INSTRUCTIONS:  Medication:  Continue to take your medications as prescribed.   Labs:  Please stop by the lab on the lower level of the building for your blood work. Your results will be released to [facility] (or called to you) after review, usually within 72 hours after test completion. If any changes need to be made, you will be notified at that same time.  1.) The lab is open from 7:30am to 5:30 pm Monday-Friday 2.) No appointment is necessary 3.) Fasting (if needed) is 6-8 hours after food and drink; black coffee and water are okay   Follow up:  If your symptoms worsen or fail to improve, please contact our office for further instruction, or in case of emergency go directly to the emergency room at the closest medical facility.    Health Maintenance, Male A healthy lifestyle and preventative care can promote health and wellness.  Maintain regular health, dental, and eye exams.  Eat a healthy diet. Foods like vegetables, fruits, whole grains, low-fat dairy products, and lean protein foods contain the nutrients you need and are low in calories. Decrease your intake of foods high in solid fats, added sugars, and salt. Get information about a proper diet from your health care provider, if necessary.  Regular physical exercise is one of the most important things you can do for your health. Most adults should get at least 150 minutes of moderate-intensity exercise (any activity that increases your heart rate and causes you to sweat) each week. In addition, most adults need muscle-strengthening exercises on 2 or more days a week.   Maintain a healthy weight. The body mass index (BMI) is a screening tool to identify possible weight problems. It provides an estimate of body fat based on height and weight. Your health care provider can find your BMI and can help you achieve or maintain a healthy weight. For males 20 years and older:  A  BMI below 18.5 is considered underweight.  A BMI of 18.5 to 24.9 is normal.  A BMI of 25 to 29.9 is considered overweight.  A BMI of 30 and above is considered obese.  Maintain normal blood lipids and cholesterol by exercising and minimizing your intake of saturated fat. Eat a balanced diet with plenty of fruits and vegetables. Blood tests for lipids and cholesterol should begin at age 18 and be repeated every 5 years. If your lipid or cholesterol levels are high, you are over age 36, or you are at high risk for heart disease, you may need your cholesterol levels checked more frequently.Ongoing high lipid and cholesterol levels should be treated with medicines if diet and exercise are not working.  If you smoke, find out from your health care provider how to quit. If you do not use tobacco, do not start.  Lung cancer screening is recommended for adults aged 23-80 years who are at high risk for developing lung cancer because of a history of smoking. A yearly low-dose CT scan of the lungs is recommended for people who have at least a 30-pack-year history of smoking and are current smokers or have quit within the past 15 years. A pack year of smoking is smoking an average of 1 pack of cigarettes a day for 1 year (for example, a 30-pack-year history of smoking could mean smoking 1 pack a day for 30 years or 2 packs a day for 15 years). Yearly screening should continue until the smoker has stopped  smoking for at least 15 years. Yearly screening should be stopped for people who develop a health problem that would prevent them from having lung cancer treatment.  If you choose to drink alcohol, do not have more than 2 drinks per day. One drink is considered to be 12 oz (360 mL) of beer, 5 oz (150 mL) of wine, or 1.5 oz (45 mL) of liquor.  Avoid the use of street drugs. Do not share needles with anyone. Ask for help if you need support or instructions about stopping the use of drugs.  High blood pressure  causes heart disease and increases the risk of stroke. High blood pressure is more likely to develop in:  People who have blood pressure in the end of the normal range (100-139/85-89 mm Hg).  People who are overweight or obese.  People who are African American.  If you are 70-25 years of age, have your blood pressure checked every 3-5 years. If you are 42 years of age or older, have your blood pressure checked every year. You should have your blood pressure measured twice-once when you are at a hospital or clinic, and once when you are not at a hospital or clinic. Record the average of the two measurements. To check your blood pressure when you are not at a hospital or clinic, you can use:  An automated blood pressure machine at a pharmacy.  A home blood pressure monitor.  If you are 6-22 years old, ask your health care provider if you should take aspirin to prevent heart disease.  Diabetes screening involves taking a blood sample to check your fasting blood sugar level. This should be done once every 3 years after age 65 if you are at a normal weight and without risk factors for diabetes. Testing should be considered at a younger age or be carried out more frequently if you are overweight and have at least 1 risk factor for diabetes.  Colorectal cancer can be detected and often prevented. Most routine colorectal cancer screening begins at the age of 44 and continues through age 75. However, your health care provider may recommend screening at an earlier age if you have risk factors for colon cancer. On a yearly basis, your health care provider may provide home test kits to check for hidden blood in the stool. A small camera at the end of a tube may be used to directly examine the colon (sigmoidoscopy or colonoscopy) to detect the earliest forms of colorectal cancer. Talk to your health care provider about this at age 25 when routine screening begins. A direct exam of the colon should be repeated  every 5-10 years through age 53, unless early forms of precancerous polyps or small growths are found.  People who are at an increased risk for hepatitis B should be screened for this virus. You are considered at high risk for hepatitis B if:  You were born in a country where hepatitis B occurs often. Talk with your health care provider about which countries are considered high risk.  Your parents were born in a high-risk country and you have not received a shot to protect against hepatitis B (hepatitis B vaccine).  You have HIV or AIDS.  You use needles to inject street drugs.  You live with, or have sex with, someone who has hepatitis B.  You are a man who has sex with other men (MSM).  You get hemodialysis treatment.  You take certain medicines for conditions like cancer, organ transplantation,  and autoimmune conditions.  Hepatitis C blood testing is recommended for all people born from [birth year] through 1966 and any individual with known risk factors for hepatitis C.  Healthy men should no longer receive prostate-specific antigen (PSA) blood tests as part of routine cancer screening. Talk to your health care provider about prostate cancer screening.  Testicular cancer screening is not recommended for adolescents or adult males who have no symptoms. Screening includes self-exam, a health care provider exam, and other screening tests. Consult with your health care provider about any symptoms you have or any concerns you have about testicular cancer.  Practice safe sex. Use condoms and avoid high-risk sexual practices to reduce the spread of sexually transmitted infections (STIs).  You should be screened for STIs, including gonorrhea and chlamydia if:  You are sexually active and are younger than 24 years.  You are older than 24 years, and your health care provider tells you that you are at risk for this type of infection.  Your sexual activity has changed since you were last screened,  and you are at an increased risk for chlamydia or gonorrhea. Ask your health care provider if you are at risk.  If you are at risk of being infected with HIV, it is recommended that you take a prescription medicine daily to prevent HIV infection. This is called pre-exposure prophylaxis (PrEP). You are considered at risk if:  You are a man who has sex with other men (MSM).  You are a heterosexual man who is sexually active with multiple partners.  You take drugs by injection.  You are sexually active with a partner who has HIV.  Talk with your health care provider about whether you are at high risk of being infected with HIV. If you choose to begin PrEP, you should first be tested for HIV. You should then be tested every 3 months for as long as you are taking PrEP.  Use sunscreen. Apply sunscreen liberally and repeatedly throughout the day. You should seek shade when your shadow is shorter than you. Protect yourself by wearing long sleeves, pants, a wide-brimmed hat, and sunglasses year round whenever you are outdoors.  Tell your health care provider of new moles or changes in moles, especially if there is a change in shape or color. Also, tell your health care provider if a mole is larger than the size of a pencil eraser.  A one-time screening for abdominal aortic aneurysm (AAA) and surgical repair of large AAAs by ultrasound is recommended for men aged 86-75 years who are current or former smokers.  Stay current with your vaccines (immunizations). This information is not intended to replace advice given to you by your health care provider. Make sure you discuss any questions you have with your health care provider. Document Released: 04/11/2008 Document Revised: 11/04/2014 Document Reviewed: 07/18/2015 Elsevier Interactive Patient Education  2017 [employer].

## 2016-12-06 NOTE — Progress Notes (Signed)
Subjective:    Patient ID: Kyle Strickland, male    DOB: [birth year], 71 y.o.   MRN: [id number]  Chief Complaint  Patient presents with  . Establish Care    wants CPE, fasting    HPI:  Kyle Strickland is a 71 y.o. male who presents today for an annual wellness visit.   1) Health Maintenance -   Diet - Averages about 2 meals per day consisting of a regular diet; Caffeine intake of about 1-2 cups per day.   Exercise - Walks at work with no structured exercise outside work.   2) Preventative Exams / Immunizations:  Dental -- Due for exam  Vision -- Up to date   Health Maintenance  Topic Date Due  . Hepatitis C Screening  01/22/1946  . TETANUS/TDAP  09/08/1964  . COLONOSCOPY  09/09/1995  . ZOSTAVAX  09/08/2005  . PNA vac Low Risk Adult (2 of 2 - PPSV23) 05/25/2016  . INFLUENZA VACCINE  01/25/2017 (Originally 05/28/2016)    Immunization History  Administered Date(s) Administered  . Pneumococcal Conjugate-13 05/26/2015  . Pneumococcal Polysaccharide-23 12/06/2016     Allergies  Allergen Reactions  . Ciprofloxacin     Hives post op  . Ditropan [Oxybutynin Chloride]     Hives 2011, post op  . Codeine     Nausea and headaches      Outpatient Medications Prior to Visit  Medication Sig Dispense Refill  . aspirin 81 MG tablet Take 81 mg by mouth daily.    . Cholecalciferol (VITAMIN D3) 1000 UNITS CAPS Take by mouth daily.      . fish oil-omega-3 fatty acids 1000 MG capsule Take 1 g by mouth daily.    . Magnesium 400 MG CAPS Take 1 tablet by mouth daily.    . Multiple Vitamin (MULTIVITAMINS PO) Take by mouth daily.      Marland Kitchen ipratropium (ATROVENT) 0.03 % nasal spray Place 2 sprays into both nostrils 2 (two) times daily. Do not use for more than 5days. 30 mL 0  . metoprolol tartrate (LOPRESSOR) 25 MG tablet Take 1 tablet (25 mg total) by mouth 2 (two) times daily. 60 tablet 5  . oseltamivir (TAMIFLU) 75 MG capsule Take 1 capsule (75 mg total) by mouth 2 (two) times daily.  10 capsule 0  . promethazine-dextromethorphan (PROMETHAZINE-DM) 6.25-15 MG/5ML syrup Take 5 mLs by mouth 3 (three) times daily as needed for cough. 240 mL 0   No facility-administered medications prior to visit.      Past Medical History:  Diagnosis Date  . Cancer [facility]) 2011   prostate  . Elevated blood-pressure reading without diagnosis of hypertension   . Other and unspecified hyperlipidemia      Past Surgical History:  Procedure Laterality Date  . no colonoscopy     SOC reviewed  . PROSTATECTOMY  2011   Dr Darcus Austin, [facility]     Family History  Problem Relation Age of Onset  . Cancer Maternal Grandfather     bladder  . Diabetes Paternal Grandfather     TIAs; CVA  . Stroke Paternal Grandfather     early 61s  . Heart attack Mother 55  . Transient ischemic attack Paternal Uncle   . Dementia Father     CVAs  . Diabetes Father     borderline     Social History   Social History  . Marital status: Married    Spouse name: N/A  . Number of children: 2  . Years of education:  14   Occupational History  . Not on file.   Social History Main Topics  . Smoking status: Never Smoker  . Smokeless tobacco: Never Used  . Alcohol use Yes     Comment: Rarely  . Drug use: No  . Sexual activity: Not on file   Other Topics Concern  . Not on file   Social History Narrative   Fun: Motorcycles, golf     Review of Systems  Constitutional: Denies fever, chills, fatigue, or significant weight gain/loss. HENT: Head: Denies headache or neck pain Ears: Denies changes in hearing, ringing in ears, earache, drainage Nose: Denies discharge, stuffiness, itching, nosebleed, sinus pain Throat: Denies sore throat, hoarseness, dry mouth, sores, thrush Eyes: Denies loss/changes in vision, pain, redness, blurry/double vision, flashing lights Cardiovascular: Denies chest pain/discomfort, tightness, palpitations, shortness of breath with activity, difficulty lying down, swelling, sudden  awakening with shortness of breath Respiratory: Denies shortness of breath, cough, sputum production, wheezing Gastrointestinal: Denies dysphasia, heartburn, change in appetite, nausea, change in bowel habits, rectal bleeding, constipation, diarrhea, yellow skin or eyes Genitourinary: Denies frequency, urgency, burning/pain, blood in urine, incontinence, change in urinary strength. Musculoskeletal: Denies muscle/joint pain, stiffness, back pain, redness or swelling of joints, trauma Skin: Denies rashes, lumps, itching, dryness, color changes, or hair/nail changes Neurological: Denies dizziness, fainting, seizures, weakness, numbness, tingling, tremor Psychiatric - Denies nervousness, stress, depression or memory loss Endocrine: Denies heat or cold intolerance, sweating, frequent urination, excessive thirst, changes in appetite Hematologic: Denies ease of bruising or bleeding     Objective:     BP 128/82 (BP Location: Left Arm, Patient Position: Sitting, Cuff Size: Large)   Pulse 66   Temp 98.2 F (36.8 C) (Oral)   Resp 16   Ht 5\' 11"  (1.803 m)   Wt 241 lb (109.3 kg)   SpO2 98%   BMI 33.61 kg/m  Nursing note and vital signs reviewed.  Physical Exam  Constitutional: He is oriented to person, place, and time. He appears well-developed and well-nourished.  HENT:  Head: Normocephalic.  Right Ear: Hearing, tympanic membrane, external ear and ear canal normal.  Left Ear: Hearing, tympanic membrane, external ear and ear canal normal.  Nose: Nose normal.  Mouth/Throat: Uvula is midline, oropharynx is clear and moist and mucous membranes are normal.  Eyes: Conjunctivae and EOM are normal. Pupils are equal, round, and reactive to light.  Neck: Neck supple. No JVD present. No tracheal deviation present. No thyromegaly present.  Cardiovascular: Normal rate, regular rhythm, normal heart sounds and intact distal pulses.   Pulmonary/Chest: Effort normal and breath sounds normal.  Abdominal:  Soft. Bowel sounds are normal. He exhibits no distension and no mass. There is no tenderness. There is no rebound and no guarding.  Musculoskeletal: Normal range of motion. He exhibits no edema or tenderness.  Lymphadenopathy:    He has no cervical adenopathy.  Neurological: He is alert and oriented to person, place, and time. He has normal reflexes. No cranial nerve deficit. He exhibits normal muscle tone. Coordination normal.  Skin: Skin is warm and dry.  Psychiatric: He has a normal mood and affect. His behavior is normal. Judgment and thought content normal.       Assessment & Plan:   Problem List Items Addressed This Visit      Other   Routine adult health maintenance - Primary    1) Anticipatory Guidance: Discussed importance of wearing a seatbelt while driving and not texting while driving; changing batteries in smoke detector at  least once annually; wearing suntan lotion when outside; eating a balanced and moderate diet; getting physical activity at least 30 minutes per day.  2) Immunizations / Screenings / Labs:  Pneumovax updated today. Discussed plan for Zostavax. Declines tetanus. All other immunizations are up-to-date per recommendations. Obtain PSA for prostate cancer screening. Obtain hepatitis C antibody for hepatitis C screening. Due for a dental exam encouraged to be completed independently. All other screenings are up-to-date per recommendations.Otain CBC, CMET, and lipid profile.    Overall well exam with risk factors for cardiovascular disease including hyperlipidemia and obesity. Recommend weight loss of 5-10% of current body weight through nutrition and physical activity. He does walk somewhat work with encouragement for structured exercise program outside of work. He continues to work in [employer]. Continue other healthy lifestyle behaviors and choices. Follow-up prevention exam in 1 year. Follow-up office visit pending blood work for chronic conditions as  needed.       Relevant Orders   PSA (Completed)   CBC (Completed)   Comprehensive metabolic panel (Completed)   Lipid panel (Completed)   Hepatitis C antibody (Completed)   Obesity    BMI of 33. Recommend weight loss of 5-10% of current body weight. Recommend increasing physical activity to 30 minutes of moderate level activity daily. Encourage nutritional intake that focuses on nutrient dense foods and is moderate, varied, and balanced and is low in saturated fats and processed/sugary foods. Continue to monitor.         Other Visit Diagnoses    Need for 23-polyvalent pneumococcal polysaccharide vaccine       Relevant Orders   Pneumococcal polysaccharide vaccine 23-valent greater than or equal to 2yo subcutaneous/IM (Completed)       I have discontinued Mr. Scianna metoprolol tartrate, oseltamivir, promethazine-dextromethorphan, and ipratropium. I am also having him maintain his Vitamin D3, Multiple Vitamin (MULTIVITAMINS PO), aspirin, fish oil-omega-3 fatty acids, Magnesium, and cyanocobalamin.   Meds ordered this encounter  Medications  . cyanocobalamin 100 MCG tablet    Sig: Take 100 mcg by mouth daily.     Follow-up: Return in about 1 year (around 12/06/2017), or if symptoms worsen or fail to improve.   Mauricio Po, FNP

## 2016-12-10 ENCOUNTER — Encounter: Payer: Self-pay | Admitting: Family

## 2016-12-18 MED ORDER — ROSUVASTATIN CALCIUM 10 MG PO TABS: 10.0000 mg | ORAL_TABLET | Freq: Every day | ORAL | 2 refills | Status: DC

## 2017-10-10 ENCOUNTER — Encounter: Payer: Self-pay | Admitting: Family Medicine

## 2017-10-10 ENCOUNTER — Ambulatory Visit: Payer: 59 | Admitting: Family Medicine

## 2017-10-10 VITALS — BP 126/80 | HR 67 | Temp 98.4°F | Ht 71.0 in | Wt 238.2 lb

## 2017-10-10 DIAGNOSIS — Z1211 Encounter for screening for malignant neoplasm of colon: Secondary | ICD-10-CM | POA: Insufficient documentation

## 2017-10-10 DIAGNOSIS — H60332 Swimmer's ear, left ear: Secondary | ICD-10-CM | POA: Diagnosis not present

## 2017-10-10 DIAGNOSIS — Z23 Encounter for immunization: Secondary | ICD-10-CM

## 2017-10-10 MED ORDER — NEOMYCIN-POLYMYXIN-HC 3.5-10000-1 OT SUSP: 4.0000 [drp] | Freq: Three times a day (TID) | OTIC | 0 refills | Status: AC

## 2017-10-10 NOTE — Patient Instructions (Addendum)
Colonoscopy, Adult A colonoscopy is an exam to look at the entire large intestine. During the exam, a lubricated, bendable tube is inserted into the anus and then passed into the rectum, colon, and other parts of the large intestine. A colonoscopy is often done as a part of normal colorectal screening or in response to certain symptoms, such as anemia, persistent diarrhea, abdominal pain, and blood in the stool. The exam can help screen for and diagnose medical problems, including:  Tumors.  Polyps.  Inflammation.  Areas of bleeding.  Tell a health care provider about:  Any allergies you have.  All medicines you are taking, including vitamins, herbs, eye drops, creams, and over-the-counter medicines.  Any problems you or family members have had with anesthetic medicines.  Any blood disorders you have.  Any surgeries you have had.  Any medical conditions you have.  Any problems you have had passing stool. What are the risks? Generally, this is a safe procedure. However, problems may occur, including:  Bleeding.  A tear in the intestine.  A reaction to medicines given during the exam.  Infection (rare).  What happens before the procedure? Eating and drinking restrictions Follow instructions from your health care provider about eating and drinking, which may include:  A few days before the procedure - follow a low-fiber diet. Avoid nuts, seeds, dried fruit, raw fruits, and vegetables.  1-3 days before the procedure - follow a clear liquid diet. Drink only clear liquids, such as clear broth or bouillon, black coffee or tea, clear juice, clear soft drinks or sports drinks, gelatin dessert, and popsicles. Avoid any liquids that contain red or purple dye.  On the day of the procedure - do not eat or drink anything during the 2 hours before the procedure, or within the time period that your health care provider recommends.  Bowel prep If you were prescribed an oral bowel prep  to clean out your colon:  Take it as told by your health care provider. Starting the day before your procedure, you will need to drink a large amount of medicated liquid. The liquid will cause you to have multiple loose stools until your stool is almost clear or light green.  If your skin or anus gets irritated from diarrhea, you may use these to relieve the irritation: ? Medicated wipes, such as adult wet wipes with aloe and vitamin E. ? A skin soothing-product like petroleum jelly.  If you vomit while drinking the bowel prep, take a break for up to 60 minutes and then begin the bowel prep again. If vomiting continues and you cannot take the bowel prep without vomiting, call your health care provider.  General instructions  Ask your health care provider about changing or stopping your regular medicines. This is especially important if you are taking diabetes medicines or blood thinners.  Plan to have someone take you home from the hospital or clinic. What happens during the procedure?  An IV tube may be inserted into one of your veins.  You will be given medicine to help you relax (sedative).  To reduce your risk of infection: ? Your health care team will wash or sanitize their hands. ? Your anal area will be washed with soap.  You will be asked to lie on your side with your knees bent.  Your health care provider will lubricate a long, thin, flexible tube. The tube will have a camera and a light on the end.  The tube will be inserted into your   anus.  The tube will be gently eased through your rectum and colon.  Air will be delivered into your colon to keep it open. You may feel some pressure or cramping.  The camera will be used to take images during the procedure.  A small tissue sample may be removed from your body to be examined under a microscope (biopsy). If any potential problems are found, the tissue will be sent to a lab for testing.  If small polyps are found, your  health care provider may remove them and have them checked for cancer cells.  The tube that was inserted into your anus will be slowly removed. The procedure may vary among health care providers and hospitals. What happens after the procedure?  Your blood pressure, heart rate, breathing rate, and blood oxygen level will be monitored until the medicines you were given have worn off.  Do not drive for 24 hours after the exam.  You may have a small amount of blood in your stool.  You may pass gas and have mild abdominal cramping or bloating due to the air that was used to inflate your colon during the exam.  It is up to you to get the results of your procedure. Ask your health care provider, or the department performing the procedure, when your results will be ready. This information is not intended to replace advice given to you by your health care provider. Make sure you discuss any questions you have with your health care provider. Document Released: 10/11/2000 Document Revised: 08/14/2016 Document Reviewed: 12/26/2015 Elsevier Interactive Patient Education  2018 [employer] Drops, Adult You have been diagnosed with a condition that requires you to put drops of medicine into your ears. Ear drops are a medicine that is placed in the ear. This sheet gives you information about how to use ear drops. Your health care provider may also give you more specific instructions. Supplies needed:  Cotton ball.  Medicine. How to put ear drops into your ear 1. Wash your hands thoroughly with soap and water. 2. Make sure your ears are clean and dry. If there is any ear wax or drainage at the outermost portion of the ear canal, wipe it out gently with a cotton-tipped applicator. 3. Warm up the medicine by holding it in the palm of your hand for a few minutes. 4. Shake the medicine if it is a suspension. 5. Use the dropper to draw up the medicine. 6. Hold the dropper above your ear canal and  put the drops in the affected ear as instructed. Do not put the dropper into your ear at any time. It may help to pull the outer flap of the ear up and back while you put the drops in. Doing this will straighten out the ear canal so the medicine can get into the canal easier. 7. To make sure your ear soaks up the medicine, do either of these things: ? Lie down with the affected ear facing up for 10 minutes. This will cause the drops to stay in the ear canal and run down and fill the canal. ? Gently put a cotton ball in your ear canal. Leave enough of the cotton ball out so it can be easily removed. Do not push the cotton ball down into your ear with a cotton-tipped swab or other instrument. You can remove the cotton ball once the medicine has been absorbed. 8. If both ears need the drops, repeat the procedure for the other  ear. Your health care provider will let you know if you need to put drops in both ears. Follow these instructions at home:  Use the ear drops for as long as directed by your health care provider, even if you begin to feel better.  Always wash your hands before and after handling the ear drops.  Keep the ear drops at room temperature.  Keep all follow-up visits as told by your health care provider. This is important. Contact a health care provider if:  Your condition gets worse.  Your pain gets worse.  You notice any unusual drainage from your ear, especially if the drainage has a bad smell.  You have trouble hearing.  You have used the ear drops for the amount of time recommended by your health care provider, but your symptoms have not improved. Get help right away if:  You experience a form of dizziness in which you feel as if the room is spinning and you feel nauseated (vertigo).  The outside of your ear becomes red or swollen.  You develop a severe headache with or without neck stiffness. Summary  Ear drops are a medicine that is placed in the ear.  Put drops  in the affected ear as instructed.  Use the ear drops for as long as directed by your health care provider, even if your symptoms begin to get better.  Keep all follow-up visits as told by your health care provider. This is important. This information is not intended to replace advice given to you by your health care provider. Make sure you discuss any questions you have with your health care provider. Document Released: 10/08/2001 Document Revised: 10/17/2016 Document Reviewed: 10/17/2016 Elsevier Interactive Patient Education  2017 [employer].

## 2017-10-10 NOTE — Progress Notes (Signed)
Subjective:  Patient ID: Kyle Strickland, male    DOB: [birth year]  Age: 71 y.o. MRN: [id number]  CC: left ear pain   HPI Kyle Strickland presents for establishment of care and left ear pain.  He uses hearing application.  His hearing has been somewhat affected.  He did admit using an ear candle 2 weeks ago.  He has had no URI symptoms.  He tried some antibiotic ointment with some relief.  The ear feels feels swollen.  He has never had a colonoscopy because he is terrified of the procedure.  We had a discussion about the procedure and I recommended reassurance.  He agrees to go for consultation.  He will have his flu shot here today.  His LDL cholesterol was found to be in mildly elevated in the past.  A statin was prescribed but he never took it.    History Kyle Strickland has a past medical history of Cancer ([place]) (2011), Elevated blood-pressure reading without diagnosis of hypertension, and Other and unspecified hyperlipidemia.   He has a past surgical history that includes Prostatectomy (2011) and no colonoscopy.   His family history includes Cancer in his maternal grandfather; Dementia in his father; Diabetes in his father and paternal grandfather; Heart attack (age of onset: 81) in his mother; Stroke in his paternal grandfather; Transient ischemic attack in his paternal uncle.He reports that  has never smoked. he has never used smokeless tobacco. He reports that he drinks alcohol. He reports that he does not use drugs.  Outpatient Medications Prior to Visit  Medication Sig Dispense Refill  . aspirin 81 MG tablet Take 81 mg by mouth daily.    . Cholecalciferol (VITAMIN D3) 1000 UNITS CAPS Take by mouth daily.      . cyanocobalamin 100 MCG tablet Take 100 mcg by mouth daily.    . fish oil-omega-3 fatty acids 1000 MG capsule Take 1 g by mouth daily.    . Magnesium 400 MG CAPS Take 1 tablet by mouth daily.    . Multiple Vitamin (MULTIVITAMINS PO) Take by mouth daily.      . rosuvastatin (CRESTOR) 10 MG  tablet Take 1 tablet (10 mg total) by mouth daily. 30 tablet 2   No facility-administered medications prior to visit.     ROS Review of Systems  Constitutional: Negative.  Negative for chills, fatigue and fever.  HENT: Positive for ear discharge and ear pain. Negative for congestion, hearing loss, nosebleeds, postnasal drip, rhinorrhea, sinus pressure and sinus pain.   Eyes: Negative.   Respiratory: Negative.   Cardiovascular: Negative.   Gastrointestinal: Negative.  Negative for abdominal pain, anal bleeding, blood in stool, nausea and vomiting.  Endocrine: Negative for polyphagia and polyuria.  Skin: Negative for rash and wound.  Neurological: Negative for weakness, numbness and headaches.  Hematological: Does not bruise/bleed easily.  Psychiatric/Behavioral: Negative.     Objective:  BP 126/80 (BP Location: Right Arm, Patient Position: Sitting, Cuff Size: Normal)   Pulse 67   Temp 98.4 F (36.9 C) (Oral)   Ht 5\' 11"  (1.803 m)   Wt 238 lb 4 oz (108.1 kg)   SpO2 98%   BMI 33.23 kg/m   Physical Exam  Constitutional: He is oriented to person, place, and time. He appears well-developed and well-nourished. No distress.  HENT:  Head: Normocephalic and atraumatic.  Right Ear: Tympanic membrane, external ear and ear canal normal.  Left Ear: There is swelling. No mastoid tenderness.  Ears:  Mouth/Throat: Oropharynx is clear and moist.  No oropharyngeal exudate.  Eyes: Conjunctivae are normal. Pupils are equal, round, and reactive to light. Right eye exhibits no discharge. Left eye exhibits no discharge. No scleral icterus.  Neck: Neck supple. No JVD present. No tracheal deviation present. No thyromegaly present.  Cardiovascular: Normal rate, regular rhythm and normal heart sounds.  Pulmonary/Chest: Effort normal and breath sounds normal. No stridor.  Lymphadenopathy:    He has no cervical adenopathy.  Neurological: He is alert and oriented to person, place, and time.  Skin:  Skin is warm and dry. He is not diaphoretic.  Psychiatric: He has a normal mood and affect. His behavior is normal.      Assessment & Plan:   Lori was seen today for left ear pain.  Diagnoses and all orders for this visit:  Screen for colon cancer -     Ambulatory referral to Gastroenterology  Acute swimmer's ear of left side -     neomycin-polymyxin-hydrocortisone (CORTISPORIN) 3.5-10000-1 OTIC suspension; Place 4 drops into the left ear 3 (three) times daily for 5 days.  Need for influenza vaccination   I have discontinued Kyle Strickland's rosuvastatin. I am also having him start on neomycin-polymyxin-hydrocortisone. Additionally, I am having him maintain his Vitamin D3, Multiple Vitamin (MULTIVITAMINS PO), aspirin, fish oil-omega-3 fatty acids, Magnesium, and cyanocobalamin.  Meds ordered this encounter  Medications  . neomycin-polymyxin-hydrocortisone (CORTISPORIN) 3.5-10000-1 OTIC suspension    Sig: Place 4 drops into the left ear 3 (three) times daily for 5 days.    Dispense:  10 mL    Refill:  0   Patient did agree to go for colonoscopy consult.  He will follow-up with me soon for a CPE and we will recheck his cholesterol.  Follow-up: Return in about 4 days (around 10/14/2017), or if symptoms worsen or fail to improve.  Libby Maw, MD

## 2017-11-11 ENCOUNTER — Encounter: Payer: Self-pay | Admitting: Family Medicine

## 2017-12-03 ENCOUNTER — Ambulatory Visit: Payer: 59 | Admitting: Family Medicine

## 2017-12-03 ENCOUNTER — Encounter: Payer: Self-pay | Admitting: Family Medicine

## 2017-12-03 VITALS — BP 136/80 | HR 67 | Temp 98.4°F | Ht 71.0 in | Wt 236.1 lb

## 2017-12-03 DIAGNOSIS — J40 Bronchitis, not specified as acute or chronic: Secondary | ICD-10-CM | POA: Diagnosis not present

## 2017-12-03 DIAGNOSIS — J209 Acute bronchitis, unspecified: Secondary | ICD-10-CM | POA: Insufficient documentation

## 2017-12-03 DIAGNOSIS — J440 Chronic obstructive pulmonary disease with acute lower respiratory infection: Secondary | ICD-10-CM

## 2017-12-03 MED ORDER — AMOXICILLIN 500 MG PO CAPS: 500.0000 mg | ORAL_CAPSULE | Freq: Three times a day (TID) | ORAL | 0 refills | Status: DC

## 2017-12-03 MED ORDER — DM-GUAIFENESIN ER 30-600 MG PO TB12: 1.0000 | ORAL_TABLET | Freq: Two times a day (BID) | ORAL | 1 refills | Status: DC | PRN

## 2017-12-03 NOTE — Progress Notes (Signed)
Subjective:  Patient ID: Kyle Strickland, male    DOB: 28-Jul-1946  Age: 73 y.o. MRN: [id number]  CC: Cough and chest cold (started to become bad Wednesday morning, patient had been traveling all last week, sick, sneezing.)   HPI Kyle Strickland presents for evaluation of a 7-day history of malaise.  This started with nausea and diarrhea that is since resolved after a day or 2.  He now has ongoing frontal head pressure with clear rhinorrhea and postnasal drip.  He is currently experiencing a cough that is productive of some yellow phlegm.  He feels a little wheezy but has no history of asthma and has never smoked.  He has tried some over-the-counter cough and cold remedies and sleep.  He has had no fever.  He has had both of his pneumonia vaccines.  Outpatient Medications Prior to Visit  Medication Sig Dispense Refill  . aspirin 81 MG tablet Take 81 mg by mouth daily.    . Cholecalciferol (VITAMIN D3) 1000 UNITS CAPS Take by mouth daily.      . cyanocobalamin 100 MCG tablet Take 100 mcg by mouth daily.    . fish oil-omega-3 fatty acids 1000 MG capsule Take 1 g by mouth daily.    . Magnesium 400 MG CAPS Take 1 tablet by mouth daily.    . Multiple Vitamin (MULTIVITAMINS PO) Take by mouth daily.       No facility-administered medications prior to visit.     ROS Review of Systems  Constitutional: Positive for fatigue. Negative for chills and fever.  HENT: Positive for congestion, postnasal drip, rhinorrhea, sneezing and sore throat. Negative for sinus pressure, trouble swallowing and voice change.   Eyes: Negative for photophobia and visual disturbance.  Respiratory: Positive for cough and wheezing. Negative for chest tightness and shortness of breath.   Cardiovascular: Negative.   Gastrointestinal: Negative for abdominal pain, diarrhea, nausea and vomiting.  Genitourinary: Negative.   Musculoskeletal: Negative for arthralgias and myalgias.  Skin: Negative for color change and rash.    Allergic/Immunologic: Negative for immunocompromised state.  Neurological: Positive for weakness and headaches. Negative for light-headedness.  Hematological: Does not bruise/bleed easily.  Psychiatric/Behavioral: Negative.     Objective:  BP 136/80 (BP Location: Left Arm, Patient Position: Sitting, Cuff Size: Normal)   Pulse 67   Temp 98.4 F (36.9 C) (Oral)   Ht 5\' 11"  (1.803 m)   Wt 236 lb 2 oz (107.1 kg)   SpO2 96%   BMI 32.93 kg/m   BP Readings from Last 3 Encounters:  12/03/17 136/80  10/10/17 126/80  12/06/16 128/82    Wt Readings from Last 3 Encounters:  12/03/17 236 lb 2 oz (107.1 kg)  10/10/17 238 lb 4 oz (108.1 kg)  12/06/16 241 lb (109.3 kg)    Physical Exam  Constitutional: He is oriented to person, place, and time. He appears well-developed and well-nourished. No distress.  HENT:  Head: Normocephalic and atraumatic.  Right Ear: External ear normal.  Left Ear: External ear normal.  Mouth/Throat: Oropharynx is clear and moist. No oropharyngeal exudate.  Eyes: Conjunctivae are normal. Pupils are equal, round, and reactive to light. Right eye exhibits no discharge. Left eye exhibits no discharge. No scleral icterus.  Neck: Neck supple. No JVD present. No tracheal deviation present. No thyromegaly present.  Cardiovascular: Normal rate, regular rhythm and normal heart sounds.  Pulmonary/Chest: Effort normal and breath sounds normal. No stridor. No respiratory distress. He has no wheezes. He has no rales.  Abdominal:  Bowel sounds are normal.  Lymphadenopathy:    He has no cervical adenopathy.  Neurological: He is alert and oriented to person, place, and time.  Skin: Skin is warm and dry. He is not diaphoretic.  Psychiatric: He has a normal mood and affect. His behavior is normal.    Lab Results  Component Value Date   WBC 5.9 12/06/2016   HGB 14.6 12/06/2016   HCT 42.8 12/06/2016   PLT 222.0 12/06/2016   GLUCOSE 113 (H) 12/06/2016   CHOL 200 12/06/2016    TRIG 200.0 (H) 12/06/2016   HDL 40.70 12/06/2016   LDLCALC 120 (H) 12/06/2016   ALT 27 12/06/2016   AST 17 12/06/2016   NA 139 12/06/2016   K 4.7 12/06/2016   CL 105 12/06/2016   CREATININE 0.96 12/06/2016   BUN 16 12/06/2016   CO2 29 12/06/2016   TSH 1.43 05/26/2015   PSA 0.01 (L) 12/06/2016   HGBA1C 5.7 05/29/2015    No results found.  Assessment & Plan:   Kyle Strickland was seen today for cough and chest cold.  Diagnoses and all orders for this visit:  Bronchitis -     amoxicillin (AMOXIL) 500 MG capsule; Take 1 capsule (500 mg total) by mouth 3 (three) times daily. -     dextromethorphan-guaiFENesin (MUCINEX DM) 30-600 MG 12hr tablet; Take 1 tablet by mouth 2 (two) times daily as needed for cough.   I am having Kyle Strickland start on amoxicillin and dextromethorphan-guaiFENesin. I am also having him maintain his Vitamin D3, Multiple Vitamin (MULTIVITAMINS PO), aspirin, fish oil-omega-3 fatty acids, Magnesium, and cyanocobalamin.  Meds ordered this encounter  Medications  . amoxicillin (AMOXIL) 500 MG capsule    Sig: Take 1 capsule (500 mg total) by mouth 3 (three) times daily.    Dispense:  30 capsule    Refill:  0  . dextromethorphan-guaiFENesin (MUCINEX DM) 30-600 MG 12hr tablet    Sig: Take 1 tablet by mouth 2 (two) times daily as needed for cough.    Dispense:  20 tablet    Refill:  1     Follow-up: No Follow-up on file.  Libby Maw, MD

## 2018-07-14 ENCOUNTER — Encounter: Payer: Self-pay | Admitting: Family Medicine

## 2018-07-14 ENCOUNTER — Ambulatory Visit (INDEPENDENT_AMBULATORY_CARE_PROVIDER_SITE_OTHER): Payer: 59 | Admitting: Family Medicine

## 2018-07-14 VITALS — BP 128/80 | HR 71 | Ht 71.0 in | Wt 236.0 lb

## 2018-07-14 DIAGNOSIS — Z0001 Encounter for general adult medical examination with abnormal findings: Secondary | ICD-10-CM

## 2018-07-14 DIAGNOSIS — G629 Polyneuropathy, unspecified: Secondary | ICD-10-CM

## 2018-07-14 DIAGNOSIS — C44311 Basal cell carcinoma of skin of nose: Secondary | ICD-10-CM | POA: Diagnosis not present

## 2018-07-14 DIAGNOSIS — C61 Malignant neoplasm of prostate: Secondary | ICD-10-CM

## 2018-07-14 DIAGNOSIS — Z6830 Body mass index (BMI) 30.0-30.9, adult: Secondary | ICD-10-CM

## 2018-07-14 DIAGNOSIS — E6609 Other obesity due to excess calories: Secondary | ICD-10-CM

## 2018-07-14 DIAGNOSIS — Z1211 Encounter for screening for malignant neoplasm of colon: Secondary | ICD-10-CM

## 2018-07-14 DIAGNOSIS — E559 Vitamin D deficiency, unspecified: Secondary | ICD-10-CM | POA: Diagnosis not present

## 2018-07-14 LAB — URINALYSIS, ROUTINE W REFLEX MICROSCOPIC
BILIRUBIN URINE: NEGATIVE
Hgb urine dipstick: NEGATIVE
KETONES UR: NEGATIVE
LEUKOCYTES UA: NEGATIVE
NITRITE: NEGATIVE
PH: 5.5 (ref 5.0–8.0)
RBC / HPF: NONE SEEN (ref 0–?)
Specific Gravity, Urine: >=1.03 — AB (ref 1.000–1.030)
Total Protein, Urine: NEGATIVE
URINE GLUCOSE: NEGATIVE
Urobilinogen, UA: 0.2 (ref 0.0–1.0)

## 2018-07-14 LAB — COMPREHENSIVE METABOLIC PANEL
ALBUMIN: 4 g/dL (ref 3.5–5.2)
ALT: 22 U/L (ref 0–53)
AST: 17 U/L (ref 0–37)
Alkaline Phosphatase: 79 U/L (ref 39–117)
BILIRUBIN TOTAL: 0.6 mg/dL (ref 0.2–1.2)
BUN: 20 mg/dL (ref 6–23)
CALCIUM: 9.9 mg/dL (ref 8.4–10.5)
CHLORIDE: 107 meq/L (ref 96–112)
CO2: 23 mEq/L (ref 19–32)
CREATININE: 0.92 mg/dL (ref 0.40–1.50)
GFR: 85.75 mL/min (ref >60.00)
Glucose, Bld: 124 mg/dL — ABNORMAL HIGH (ref 70–99)
Potassium: 4.4 mEq/L (ref 3.5–5.1)
SODIUM: 140 meq/L (ref 135–145)
TOTAL PROTEIN: 6.8 g/dL (ref 6.0–8.3)

## 2018-07-14 LAB — LIPID PANEL
CHOLESTEROL: 187 mg/dL (ref 0–200)
HDL: 38 mg/dL — ABNORMAL LOW (ref >39.00)
LDL CALC: 127 mg/dL — AB (ref 0–99)
NonHDL: 149.34
TRIGLYCERIDES: 110 mg/dL (ref 0.0–149.0)
Total CHOL/HDL Ratio: 5
VLDL: 22 mg/dL (ref 0.0–40.0)

## 2018-07-14 LAB — CBC
HCT: 42.3 % (ref 39.0–52.0)
HEMOGLOBIN: 14.7 g/dL (ref 13.0–17.0)
MCHC: 34.7 g/dL (ref 30.0–36.0)
MCV: 91.9 fl (ref 78.0–100.0)
PLATELETS: 209 10*3/uL (ref 150.0–400.0)
RBC: 4.61 Mil/uL (ref 4.22–5.81)
RDW: 13.1 % (ref 11.5–15.5)
WBC: 5.7 10*3/uL (ref 4.0–10.5)

## 2018-07-14 LAB — VITAMIN D 25 HYDROXY (VIT D DEFICIENCY, FRACTURES): VITD: 41.25 ng/mL (ref 30.00–100.00)

## 2018-07-14 LAB — PSA: PSA: 0.02 ng/mL — ABNORMAL LOW (ref 0.10–4.00)

## 2018-07-14 LAB — VITAMIN B12: Vitamin B-12: 549 pg/mL (ref 211–911)

## 2018-07-14 LAB — TSH: TSH: 1.73 u[IU]/mL (ref 0.35–4.50)

## 2018-07-14 NOTE — Patient Instructions (Signed)
Cancer Screening for Men A cancer screening is a test or exam that checks for cancer. Your health care provider will recommend specific cancer screenings based on your age, personal history, and family history of cancer. Work with your health care provider to create a cancer screening schedule that protects your health. Why is cancer screening done? Cancer screening is done to look for cancer in the very early stages, before it spreads and becomes harder to treat and before you would start to notice symptoms. Finding cancer early improves the chances of successful treatment. It may save your life. Who should be screened for cancer? All men should be screened for colorectal cancer and skin cancer. Your health care provider may recommend screenings for other types of cancer if:  You had cancer before.  You have a family member with cancer.  You have abnormal genes that could increase the risk of cancer.  You have risk factors for certain cancers, such as smoking.  When you should be screened for cancer depends on:  Your age.  Your medical history and your family's medical history.  Certain lifestyle factors, such as smoking.  Environmental exposure, such as to asbestos.  What are some common cancer screenings? Lung cancer Lung cancer screening is done with a CT scan that looks for abnormal cells in the lungs. Discuss lung cancer screening with your health care provider if you are 30-18 years old and if any of the following apply to you:  You currently smoke.  You used to smoke heavily.  You have had at least a 30-pack-year smoking history.  You have quit smoking within the past 15 years.  If you smoke heavily or if you used to smoke, you may need to be screened every year. Prostate cancer Prostate cancer screening is done with blood tests and an exam in which a health care provider uses a gloved finger to check prostate size (digital rectal exam). You may need to be screened for  prostate cancer if:  You have risk factors of prostate cancer, such as being African American or having a close family member with prostate cancer.  You have inherited gene changes or a genetic condition, including BRCA1 or BRCA2 gene mutations or Lynch syndrome.  You have symptoms of prostate cancer, such as problems urinating or erectile dysfunction.  Prostate cancer screening for men with average risk may start at age 107. Men with risk factors may need to be screened earlier at age 56-45. Once you have been screened for prostate cancer, future screening may be recommended based on the results of your blood tests. Colorectal cancer Screening for colorectal cancer is recommended starting at age 23 for most men. If you have a family history of colon or rectal cancer or other risk factors, you may need to start having screenings earlier. Talk with your health care provider about which screening test is right for you and how often you should be screened. Colorectal cancer screening looks for cancer or for growths called polyps that often form before cancer starts. Tests to look for cancer or polyps include:  Colonoscopy or flexible sigmoidoscopy. For these procedures, a flexible tube with a small camera is inserted into the rectum.  CT colonography. This test uses X-rays and a contrast dye to check the colon for polyps. If a polyp is found, you may need to have a colonoscopy so the polyp can be located and removed.  Tests to look for cancer in the stool (feces) include:  Guaiac-based fecal  occult blood test (FOBT). This test detects blood in stool. It can be done at home with a kit.  Fecal immunochemical test (FIT). This test detects blood in stool. For this test, you will need to collect stool samples at home.  Stool DNA test. This test looks for blood in stool and any changes in DNA that can lead to colon cancer. For this test, you will need to collect a stool sample at home and send it to a  lab.  Skin cancer Skin cancer screening is done by checking the skin for unusual moles or spots and any changes in existing moles. Your health care provider should check your skin for signs of skin cancer at every physical exam. You should check your skin every month and tell your health care provider right away if anything looks unusual. Men with a higher-than-normal risk for skin cancer may want to see a skin specialist (dermatologist) for an annual body check. Where to find more information:  [place]: SkinPromotion.no  Centers for Disease Control and Prevention: http://knight-sullivan.biz/  American Cancer Society: https://www.cancer.org/latest-news/4-cancer-screening-tests-for-men.html Contact a health care provider if:  You have concerns about any signs or symptoms of cancer, such as: ? Moles that have an unusual shape or color. ? Changes in existing moles. ? A sore on your skin that does not heal. ? Blood in your urine or stool. ? Fatigue that does not go away. ? Frequent pain or cramping in your abdomen. ? Coughing or trouble breathing that does not go away. ? Coughing up blood. ? Losing weight without trying. ? Changes in urination habits. ? Painful urination or ejaculation. Summary  Be aware of and watch for signs and symptoms of cancer, especially symptoms of lung cancer, prostate cancer, colorectal cancer, and skin cancer.  Early detection of cancer with cancer screening may save your life.  Talk with your health care provider about your specific cancer risks.  Work together with your health care provider to create a cancer screening plan that is right for you. This information is not intended to replace advice given to you by your health care provider. Make sure you discuss any questions you have with your health care provider. Document Released: 07/11/2016 Document Revised: 07/11/2016 Document  Reviewed: 07/11/2016 Elsevier Interactive Patient Education  [employer].  Colonoscopy, Adult A colonoscopy is an exam to look at the entire large intestine. During the exam, a lubricated, bendable tube is inserted into the anus and then passed into the rectum, colon, and other parts of the large intestine. A colonoscopy is often done as a part of normal colorectal screening or in response to certain symptoms, such as anemia, persistent diarrhea, abdominal pain, and blood in the stool. The exam can help screen for and diagnose medical problems, including:  Tumors.  Polyps.  Inflammation.  Areas of bleeding.  Tell a health care provider about:  Any allergies you have.  All medicines you are taking, including vitamins, herbs, eye drops, creams, and over-the-counter medicines.  Any problems you or family members have had with anesthetic medicines.  Any blood disorders you have.  Any surgeries you have had.  Any medical conditions you have.  Any problems you have had passing stool. What are the risks? Generally, this is a safe procedure. However, problems may occur, including:  Bleeding.  A tear in the intestine.  A reaction to medicines given during the exam.  Infection (rare).  What happens before the procedure? Eating and drinking restrictions Follow instructions  from your health care provider about eating and drinking, which may include:  A few days before the procedure - follow a low-fiber diet. Avoid nuts, seeds, dried fruit, raw fruits, and vegetables.  1-3 days before the procedure - follow a clear liquid diet. Drink only clear liquids, such as clear broth or bouillon, black coffee or tea, clear juice, clear soft drinks or sports drinks, gelatin dessert, and popsicles. Avoid any liquids that contain red or purple dye.  On the day of the procedure - do not eat or drink anything during the 2 hours before the procedure, or within the time period that your  health care provider recommends.  Bowel prep If you were prescribed an oral bowel prep to clean out your colon:  Take it as told by your health care provider. Starting the day before your procedure, you will need to drink a large amount of medicated liquid. The liquid will cause you to have multiple loose stools until your stool is almost clear or light green.  If your skin or anus gets irritated from diarrhea, you may use these to relieve the irritation: ? Medicated wipes, such as adult wet wipes with aloe and vitamin E. ? A skin soothing-product like petroleum jelly.  If you vomit while drinking the bowel prep, take a break for up to 60 minutes and then begin the bowel prep again. If vomiting continues and you cannot take the bowel prep without vomiting, call your health care provider.  General instructions  Ask your health care provider about changing or stopping your regular medicines. This is especially important if you are taking diabetes medicines or blood thinners.  Plan to have someone take you home from the hospital or clinic. What happens during the procedure?  An IV tube may be inserted into one of your veins.  You will be given medicine to help you relax (sedative).  To reduce your risk of infection: ? Your health care team will wash or sanitize their hands. ? Your anal area will be washed with soap.  You will be asked to lie on your side with your knees bent.  Your health care provider will lubricate a long, thin, flexible tube. The tube will have a camera and a light on the end.  The tube will be inserted into your anus.  The tube will be gently eased through your rectum and colon.  Air will be delivered into your colon to keep it open. You may feel some pressure or cramping.  The camera will be used to take images during the procedure.  A small tissue sample may be removed from your body to be examined under a microscope (biopsy). If any potential problems are  found, the tissue will be sent to a lab for testing.  If small polyps are found, your health care provider may remove them and have them checked for cancer cells.  The tube that was inserted into your anus will be slowly removed. The procedure may vary among health care providers and hospitals. What happens after the procedure?  Your blood pressure, heart rate, breathing rate, and blood oxygen level will be monitored until the medicines you were given have worn off.  Do not drive for 24 hours after the exam.  You may have a small amount of blood in your stool.  You may pass gas and have mild abdominal cramping or bloating due to the air that was used to inflate your colon during the exam.  It is up  to you to get the results of your procedure. Ask your health care provider, or the department performing the procedure, when your results will be ready. This information is not intended to replace advice given to you by your health care provider. Make sure you discuss any questions you have with your health care provider. Document Released: 10/11/2000 Document Revised: 08/14/2016 Document Reviewed: 12/26/2015 Elsevier Interactive Patient Education  2018 [employer].  Exercising to [employer] Exercising can help you to lose weight. In order to lose weight through exercise, you need to do vigorous-intensity exercise. You can tell that you are exercising with vigorous intensity if you are breathing very hard and fast and cannot hold a conversation while exercising. Moderate-intensity exercise helps to maintain your current weight. You can tell that you are exercising at a moderate level if you have a higher heart rate and faster breathing, but you are still able to hold a conversation. How often should I exercise? Choose an activity that you enjoy and set realistic goals. Your health care provider can help you to make an activity plan that works for you. Exercise regularly as directed by your  health care provider. This may include:  Doing resistance training twice each week, such as: ? Push-ups. ? Sit-ups. ? Lifting weights. ? Using resistance bands.  Doing a given intensity of exercise for a given amount of time. Choose from these options: ? 150 minutes of moderate-intensity exercise every week. ? 75 minutes of vigorous-intensity exercise every week. ? A mix of moderate-intensity and vigorous-intensity exercise every week.  Children, pregnant women, people who are out of shape, people who are overweight, and older adults may need to consult a health care provider for individual recommendations. If you have any sort of medical condition, be sure to consult your health care provider before starting a new exercise program. What are some activities that can help me to lose weight?  Walking at a rate of at least 4.5 miles an hour.  Jogging or running at a rate of 5 miles per hour.  Biking at a rate of at least 10 miles per hour.  Lap swimming.  Roller-skating or in-line skating.  Cross-country skiing.  Vigorous competitive sports, such as football, basketball, and soccer.  Jumping rope.  Aerobic dancing. How can I be more active in my day-to-day activities?  Use the stairs instead of the elevator.  Take a walk during your lunch break.  If you drive, park your car farther away from work or school.  If you take public transportation, get off one stop early and walk the rest of the way.  Make all of your phone calls while standing up and walking around.  Get up, stretch, and walk around every 30 minutes throughout the day. What guidelines should I follow while exercising?  Do not exercise so much that you hurt yourself, feel dizzy, or get very short of breath.  Consult your health care provider prior to starting a new exercise program.  Wear comfortable clothes and shoes with good support.  Drink plenty of water while you exercise to prevent dehydration or  heat stroke. Body water is lost during exercise and must be replaced.  Work out until you breathe faster and your heart beats faster. This information is not intended to replace advice given to you by your health care provider. Make sure you discuss any questions you have with your health care provider. Document Released: 11/16/2010 Document Revised: 03/21/2016 Document Reviewed: 03/17/2014 Elsevier Interactive Patient Education  2018 [place] Maintenance, Male A healthy lifestyle and preventive care is important for your health and wellness. Ask your health care provider about what schedule of regular examinations is right for you. What should I know about weight and diet? Eat a Healthy Diet  Eat plenty of vegetables, fruits, whole grains, low-fat dairy products, and lean protein.  Do not eat a lot of foods high in solid fats, added sugars, or salt.  Maintain a Healthy Weight Regular exercise can help you achieve or maintain a healthy weight. You should:  Do at least 150 minutes of exercise each week. The exercise should increase your heart rate and make you sweat (moderate-intensity exercise).  Do strength-training exercises at least twice a week.  Watch Your Levels of Cholesterol and Blood Lipids  Have your blood tested for lipids and cholesterol every 5 years starting at 73 years of age. If you are at high risk for heart disease, you should start having your blood tested when you are 73 years old. You may need to have your cholesterol levels checked more often if: ? Your lipid or cholesterol levels are high. ? You are older than 73 years of age. ? You are at high risk for heart disease.  What should I know about cancer screening? Many types of cancers can be detected early and may often be prevented. Lung Cancer  You should be screened every year for lung cancer if: ? You are a current smoker who has smoked for at least 30 years. ? You are a former smoker who has  quit within the past 15 years.  Talk to your health care provider about your screening options, when you should start screening, and how often you should be screened.  Colorectal Cancer  Routine colorectal cancer screening usually begins at 73 years of age and should be repeated every 5-10 years until you are 73 years old. You may need to be screened more often if early forms of precancerous polyps or small growths are found. Your health care provider may recommend screening at an earlier age if you have risk factors for colon cancer.  Your health care provider may recommend using home test kits to check for hidden blood in the stool.  A small camera at the end of a tube can be used to examine your colon (sigmoidoscopy or colonoscopy). This checks for the earliest forms of colorectal cancer.  Prostate and Testicular Cancer  Depending on your age and overall health, your health care provider may do certain tests to screen for prostate and testicular cancer.  Talk to your health care provider about any symptoms or concerns you have about testicular or prostate cancer.  Skin Cancer  Check your skin from head to toe regularly.  Tell your health care provider about any new moles or changes in moles, especially if: ? There is a change in a mole's size, shape, or color. ? You have a mole that is larger than a pencil eraser.  Always use sunscreen. Apply sunscreen liberally and repeat throughout the day.  Protect yourself by wearing long sleeves, pants, a wide-brimmed hat, and sunglasses when outside.  What should I know about heart disease, diabetes, and high blood pressure?  If you are 49-49 years of age, have your blood pressure checked every 3-5 years. If you are 65 years of age or older, have your blood pressure checked every year. You should have your blood pressure measured twice-once when you are at a hospital or  clinic, and once when you are not at a hospital or clinic. Record the  average of the two measurements. To check your blood pressure when you are not at a hospital or clinic, you can use: ? An automated blood pressure machine at a pharmacy. ? A home blood pressure monitor.  Talk to your health care provider about your target blood pressure.  If you are between 110-26 years old, ask your health care provider if you should take aspirin to prevent heart disease.  Have regular diabetes screenings by checking your fasting blood sugar level. ? If you are at a normal weight and have a low risk for diabetes, have this test once every three years after the age of 69. ? If you are overweight and have a high risk for diabetes, consider being tested at a younger age or more often.  A one-time screening for abdominal aortic aneurysm (AAA) by ultrasound is recommended for men aged 17-75 years who are current or former smokers. What should I know about preventing infection? Hepatitis B If you have a higher risk for hepatitis B, you should be screened for this virus. Talk with your health care provider to find out if you are at risk for hepatitis B infection. Hepatitis C Blood testing is recommended for:  Everyone born from [birth year] through 1966.  Anyone with known risk factors for hepatitis C.  Sexually Transmitted Diseases (STDs)  You should be screened each year for STDs including gonorrhea and chlamydia if: ? You are sexually active and are younger than 73 years of age. ? You are older than 73 years of age and your health care provider tells you that you are at risk for this type of infection. ? Your sexual activity has changed since you were last screened and you are at an increased risk for chlamydia or gonorrhea. Ask your health care provider if you are at risk.  Talk with your health care provider about whether you are at high risk of being infected with HIV. Your health care provider may recommend a prescription medicine to help prevent HIV infection.  What else can  I do?  Schedule regular health, dental, and eye exams.  Stay current with your vaccines (immunizations).  Do not use any tobacco products, such as cigarettes, chewing tobacco, and e-cigarettes. If you need help quitting, ask your health care provider.  Limit alcohol intake to no more than 2 drinks per day. One drink equals 12 ounces of beer, 5 ounces of wine, or 1 ounces of hard liquor.  Do not use street drugs.  Do not share needles.  Ask your health care provider for help if you need support or information about quitting drugs.  Tell your health care provider if you often feel depressed.  Tell your health care provider if you have ever been abused or do not feel safe at home. This information is not intended to replace advice given to you by your health care provider. Make sure you discuss any questions you have with your health care provider. Document Released: 04/11/2008 Document Revised: 06/12/2016 Document Reviewed: 07/18/2015 Elsevier Interactive Patient Education  [employer].

## 2018-07-14 NOTE — Progress Notes (Signed)
Subjective:  Patient ID: Kyle Strickland, male    DOB: 1945-11-12  Age: 72 y.o. MRN: [id number]  CC: Annual Exam   HPI Kyle Strickland presents for fasting for complete physical exam.  He lives with his wife and continues to work every day at his job.  He rarely drinks alcohol and does not smoke or use illicit drugs.  He does not engage in regular exercise.  He rides motorcycles and plays golf for relaxation.  He is status post radical prostatectomy 6 years ago for prostate cancer.  He has done well since this procedure with minimal notes and ED issues.  He is currently experiencing some intermittent numbness in the toes of both of his feet.  Denies specific back or leg pain.  No history of anemia or diabetes.  He is supplementing with vitamin B12.  He is taking supplemental vitamin D for vitamin D deficiency.  He has never had a colonoscopy and is worried about having this procedure done.  He has tried to have the Cologuard done in the past but his insurance would not pay for it he tells me.  He did agree to revisit this option.  Outpatient Medications Prior to Visit  Medication Sig Dispense Refill  . aspirin 81 MG tablet Take 81 mg by mouth daily.    . Cholecalciferol (VITAMIN D3) 1000 UNITS CAPS Take by mouth daily.      . cyanocobalamin 100 MCG tablet Take 100 mcg by mouth daily.    . fish oil-omega-3 fatty acids 1000 MG capsule Take 1 g by mouth daily.    . Magnesium 400 MG CAPS Take 1 tablet by mouth daily.    . Multiple Vitamin (MULTIVITAMINS PO) Take by mouth daily.      Marland Kitchen amoxicillin (AMOXIL) 500 MG capsule Take 1 capsule (500 mg total) by mouth 3 (three) times daily. 30 capsule 0  . dextromethorphan-guaiFENesin (MUCINEX DM) 30-600 MG 12hr tablet Take 1 tablet by mouth 2 (two) times daily as needed for cough. 20 tablet 1   No facility-administered medications prior to visit.     ROS Review of Systems  Constitutional: Negative for chills, fatigue, fever and unexpected weight change.    Eyes: Negative for photophobia and visual disturbance.  Respiratory: Negative.   Cardiovascular: Negative.   Gastrointestinal: Negative.   Endocrine: Negative for polyphagia and polyuria.  Genitourinary: Negative for difficulty urinating and frequency.  Musculoskeletal: Negative for back pain, gait problem and joint swelling.  Skin: Negative for color change, pallor and rash.  Allergic/Immunologic: Negative for immunocompromised state.  Neurological: Positive for numbness. Negative for weakness and headaches.  Hematological: Does not bruise/bleed easily.  Psychiatric/Behavioral: Negative.     Objective:  BP 128/80   Pulse 71   Ht 5\' 11"  (1.803 m)   Wt 236 lb (107 kg)   SpO2 95%   BMI 32.92 kg/m   BP Readings from Last 3 Encounters:  07/14/18 128/80  12/03/17 136/80  10/10/17 126/80    Wt Readings from Last 3 Encounters:  07/14/18 236 lb (107 kg)  12/03/17 236 lb 2 oz (107.1 kg)  10/10/17 238 lb 4 oz (108.1 kg)    Physical Exam  Constitutional: He is oriented to person, place, and time. He appears well-developed and well-nourished. No distress.  HENT:  Head: Normocephalic and atraumatic.  Right Ear: External ear normal.  Left Ear: External ear normal.  Nose: Nose normal.  Mouth/Throat: Oropharynx is clear and moist.  Eyes: Conjunctivae and EOM are normal. Right  eye exhibits no discharge. Left eye exhibits no discharge. No scleral icterus.  Neck: Neck supple. No JVD present. No tracheal deviation present. No thyromegaly present.  Cardiovascular: Normal rate, regular rhythm and normal heart sounds.  Pulmonary/Chest: Effort normal and breath sounds normal.  Abdominal: Soft. Bowel sounds are normal. He exhibits distension. There is no tenderness. There is no guarding.  Genitourinary: Rectal exam shows no external hemorrhoid, no internal hemorrhoid, no fissure, no mass, no tenderness, anal tone normal and guaiac negative stool. Prostate is not enlarged (absent).   Musculoskeletal: He exhibits no edema.  Lymphadenopathy:    He has no cervical adenopathy.  Neurological: He is alert and oriented to person, place, and time.  Skin: Skin is warm and dry. He is not diaphoretic.     Psychiatric: He has a normal mood and affect. His behavior is normal.    Lab Results  Component Value Date   WBC 5.9 12/06/2016   HGB 14.6 12/06/2016   HCT 42.8 12/06/2016   PLT 222.0 12/06/2016   GLUCOSE 113 (H) 12/06/2016   CHOL 200 12/06/2016   TRIG 200.0 (H) 12/06/2016   HDL 40.70 12/06/2016   LDLCALC 120 (H) 12/06/2016   ALT 27 12/06/2016   AST 17 12/06/2016   NA 139 12/06/2016   K 4.7 12/06/2016   CL 105 12/06/2016   CREATININE 0.96 12/06/2016   BUN 16 12/06/2016   CO2 29 12/06/2016   TSH 1.43 05/26/2015   PSA 0.01 (L) 12/06/2016   HGBA1C 5.7 05/29/2015    No results found.  Assessment & Plan:   Kyle Strickland was seen today for annual exam.  Diagnoses and all orders for this visit:  Encounter for health maintenance examination with abnormal findings -     Comprehensive metabolic panel -     Lipid panel -     CBC -     Urinalysis, Routine w reflex microscopic  Neuropathy -     Vitamin B12 -     TSH  Prostate cancer (HCC) -     Urinalysis, Routine w reflex microscopic -     PSA  Class 1 obesity due to excess calories with serious comorbidity and body mass index (BMI) of 30.0 to 30.9 in adult  Screen for colon cancer -     Cologuard  Vitamin D deficiency -     VITAMIN D 25 Hydroxy (Vit-D Deficiency, Fractures)  Basal cell carcinoma (BCC) of skin of nose -     Ambulatory referral to Dermatology   I have discontinued Kyle Strickland's amoxicillin and dextromethorphan-guaiFENesin. I am also having him maintain his Vitamin D3, Multiple Vitamin (MULTIVITAMINS PO), aspirin, fish oil-omega-3 fatty acids, Magnesium, and cyanocobalamin.  No orders of the defined types were placed in this encounter.  I had a full 10-minute discussion with this  patient encouraging him to go ahead with his first colonoscopy.  He is aware that behind skin and then prostate cancer colon cancer would be his #1 cancer.  He is having no change in his bowel habits or blood in his stool but explained that these things are not necessarily present.  Explained the prep and procedure to him in some detail.  Encouraged him to exercise and lose weight.  He was given anticipatory guidance on exercising to lose weight, cancer screening, colon cancer screening as well as health maintenance and disease prevention.  Asked him to follow-up in 3 months.  Follow-up: Return in about 3 months (around 10/13/2018).  Libby Maw, MD

## 2019-02-25 ENCOUNTER — Telehealth: Payer: Self-pay | Admitting: Family Medicine

## 2019-02-25 NOTE — Telephone Encounter (Signed)
Called pt on behalf of Dr Ethelene Hal because based on last appt Dr Ethelene Hal wanted to see pt again for 3 month follow up in December but it was scheduled, I wanted to see if he would be fine doing a virtual follow up appt

## 2019-03-05 ENCOUNTER — Ambulatory Visit (INDEPENDENT_AMBULATORY_CARE_PROVIDER_SITE_OTHER): Payer: 59 | Admitting: Family Medicine

## 2019-03-05 ENCOUNTER — Encounter: Payer: Self-pay | Admitting: Family Medicine

## 2019-03-05 VITALS — Ht 71.0 in

## 2019-03-05 DIAGNOSIS — H00021 Hordeolum internum right upper eyelid: Secondary | ICD-10-CM | POA: Diagnosis not present

## 2019-03-05 DIAGNOSIS — H01001 Unspecified blepharitis right upper eyelid: Secondary | ICD-10-CM | POA: Diagnosis not present

## 2019-03-05 DIAGNOSIS — H109 Unspecified conjunctivitis: Secondary | ICD-10-CM | POA: Insufficient documentation

## 2019-03-05 MED ORDER — TOBRAMYCIN 0.3 % OP SOLN: 2.0000 [drp] | Freq: Four times a day (QID) | OPHTHALMIC | 0 refills | Status: DC

## 2019-03-05 NOTE — Progress Notes (Signed)
Virtual Visit via Video Note  I connected with Kyle Strickland on 03/05/19 at  9:30 AM EDT by a video enabled telemedicine application and verified that I am speaking with the correct person using two identifiers.  Location: Patient: home Provider:    Established Patient Office Visit  Subjective:  Patient ID: Kyle Strickland, male    DOB: [birth year]  Age: 73 y.o. MRN: [id number]  CC:  Chief Complaint  Patient presents with  . right eye    eyelid swollen, feels like something is in it    HPI Kyle Strickland presents for 1 day history of irritation of his right upper eyelid.  There is some swelling in the eyelid.  Eyelids were slightly matted this morning.  However there is no visible discharge.  Vision is normal.  Eye is irritated.  There was no foreign body exposure.  No injury to the eye.  Cologuard was not approved by [employer].  Patient chose not to follow-up in this past December as directed to do.  Past Medical History:  Diagnosis Date  . Cancer [facility]) 2011   prostate  . Elevated blood-pressure reading without diagnosis of hypertension   . Other and unspecified hyperlipidemia     Past Surgical History:  Procedure Laterality Date  . no colonoscopy     SOC reviewed  . PROSTATECTOMY  2011   Dr Darcus Austin, [facility]    Family History  Problem Relation Age of Onset  . Cancer Maternal Grandfather        bladder  . Diabetes Paternal Grandfather        TIAs; CVA  . Stroke Paternal Grandfather        early 30s  . Heart attack Mother 23  . Transient ischemic attack Paternal Uncle   . Dementia Father        CVAs  . Diabetes Father        borderline    Social History   Socioeconomic History  . Marital status: Married    Spouse name: Not on file  . Number of children: 2  . Years of education: 76  . Highest education level: Not on file  Occupational History  . Not on file  Social Needs  . Financial resource strain: Not on file  . Food insecurity:    Worry: Not on file    Inability: Not on file  . Transportation needs:    Medical: Not on file    Non-medical: Not on file  Tobacco Use  . Smoking status: Never Smoker  . Smokeless tobacco: Never Used  Substance and Sexual Activity  . Alcohol use: Yes    Comment: Rarely  . Drug use: No  . Sexual activity: Not on file  Lifestyle  . Physical activity:    Days per week: Not on file    Minutes per session: Not on file  . Stress: Not on file  Relationships  . Social connections:    Talks on phone: Not on file    Gets together: Not on file    Attends religious service: Not on file    Active member of club or organization: Not on file    Attends meetings of clubs or organizations: Not on file    Relationship status: Not on file  . Intimate partner violence:    Fear of current or ex partner: Not on file    Emotionally abused: Not on file    Physically abused: Not on file    Forced sexual activity: Not  on file  Other Topics Concern  . Not on file  Social History Narrative   Fun: Motorcycles, golf     Outpatient Medications Prior to Visit  Medication Sig Dispense Refill  . aspirin 81 MG tablet Take 81 mg by mouth daily.    . Cholecalciferol (VITAMIN D3) 1000 UNITS CAPS Take by mouth daily.      . cyanocobalamin 100 MCG tablet Take 100 mcg by mouth daily.    . fish oil-omega-3 fatty acids 1000 MG capsule Take 1 g by mouth daily.    . Magnesium 400 MG CAPS Take 1 tablet by mouth daily.    . Multiple Vitamin (MULTIVITAMINS PO) Take by mouth daily.       No facility-administered medications prior to visit.     Allergies  Allergen Reactions  . Ciprofloxacin     Hives post op  . Ditropan [Oxybutynin Chloride]     Hives 2011, post op  . Codeine     Nausea and headaches     ROS Review of Systems  Constitutional: Negative.   HENT: Negative.   Eyes: Positive for discharge and itching. Negative for photophobia, pain, redness and visual disturbance.  Respiratory: Negative.   Cardiovascular:  Negative.   Gastrointestinal: Negative.       Objective:    Physical Exam  Constitutional: He is oriented to person, place, and time. He appears well-developed and well-nourished. No distress.  HENT:  Head: Normocephalic and atraumatic.  Right Ear: External ear normal.  Left Ear: External ear normal.  Eyes: EOM are normal.    Pulmonary/Chest: Effort normal.  Neurological: He is alert and oriented to person, place, and time.  Skin: He is not diaphoretic.  Psychiatric: He has a normal mood and affect. His behavior is normal.    Ht 5\' 11"  (1.803 m)   BMI 32.92 kg/m  Wt Readings from Last 3 Encounters:  07/14/18 236 lb (107 kg)  12/03/17 236 lb 2 oz (107.1 kg)  10/10/17 238 lb 4 oz (108.1 kg)     Health Maintenance Due  Topic Date Due  . TETANUS/TDAP  09/08/1964  . COLONOSCOPY  09/09/1995    There are no preventive care reminders to display for this patient.  Lab Results  Component Value Date   TSH 1.73 07/14/2018   Lab Results  Component Value Date   WBC 5.7 07/14/2018   HGB 14.7 07/14/2018   HCT 42.3 07/14/2018   MCV 91.9 07/14/2018   PLT 209.0 07/14/2018   Lab Results  Component Value Date   NA 140 07/14/2018   K 4.4 07/14/2018   CO2 23 07/14/2018   GLUCOSE 124 (H) 07/14/2018   BUN 20 07/14/2018   CREATININE 0.92 07/14/2018   BILITOT 0.6 07/14/2018   ALKPHOS 79 07/14/2018   AST 17 07/14/2018   ALT 22 07/14/2018   PROT 6.8 07/14/2018   ALBUMIN 4.0 07/14/2018   CALCIUM 9.9 07/14/2018   GFR 85.75 07/14/2018   Lab Results  Component Value Date   CHOL 187 07/14/2018   Lab Results  Component Value Date   HDL 38.00 (L) 07/14/2018   Lab Results  Component Value Date   LDLCALC 127 (H) 07/14/2018   Lab Results  Component Value Date   TRIG 110.0 07/14/2018   Lab Results  Component Value Date   CHOLHDL 5 07/14/2018   Lab Results  Component Value Date   HGBA1C 5.7 05/29/2015      Assessment & Plan:   Problem List Items Addressed  This  Visit      Other   Blepharitis of right upper eyelid - Primary   Relevant Medications   tobramycin (TOBREX) 0.3 % ophthalmic solution      Meds ordered this encounter  Medications  . tobramycin (TOBREX) 0.3 % ophthalmic solution    Sig: Place 2 drops into the right eye every 6 (six) hours.    Dispense:  5 mL    Refill:  0    Follow-up: No follow-ups on file.    Libby Maw, MD   I discussed the limitations of evaluation and management by telemedicine and the availability of in person appointments. The patient expressed understanding and agreed to proceed.  History of Present Illness:    Observations/Objective:   Assessment and Plan:   Follow Up Instructions:    I discussed the assessment and treatment plan with the patient. The patient was provided an opportunity to ask questions and all were answered. The patient agreed with the plan and demonstrated an understanding of the instructions.   The patient was advised to call back or seek an in-person evaluation if the symptoms worsen or if the condition fails to improve as anticipated.  I provided 15 minutes of non-face-to-face time during this encounter.   Patient will use the eyedrops over the weekend.  He will let me know how he is doing on Monday.  Ophthalmology referral if not improved.  He will consider allowing me to refer him for colonoscopy and let me know.  Reminded him that he is due for follow-up of an elevated blood sugar.  Advised him to follow-up for this as well.

## 2019-07-15 ENCOUNTER — Telehealth: Payer: Self-pay

## 2019-07-15 NOTE — Telephone Encounter (Signed)

## 2019-07-16 ENCOUNTER — Encounter: Payer: Self-pay | Admitting: Family Medicine

## 2019-07-16 ENCOUNTER — Ambulatory Visit (INDEPENDENT_AMBULATORY_CARE_PROVIDER_SITE_OTHER): Payer: 59 | Admitting: Family Medicine

## 2019-07-16 ENCOUNTER — Other Ambulatory Visit: Payer: Self-pay

## 2019-07-16 VITALS — BP 120/76 | HR 70 | Ht 71.0 in | Wt 231.0 lb

## 2019-07-16 DIAGNOSIS — E6609 Other obesity due to excess calories: Secondary | ICD-10-CM | POA: Diagnosis not present

## 2019-07-16 DIAGNOSIS — Z0001 Encounter for general adult medical examination with abnormal findings: Secondary | ICD-10-CM | POA: Diagnosis not present

## 2019-07-16 DIAGNOSIS — C61 Malignant neoplasm of prostate: Secondary | ICD-10-CM | POA: Diagnosis not present

## 2019-07-16 DIAGNOSIS — Z6830 Body mass index (BMI) 30.0-30.9, adult: Secondary | ICD-10-CM

## 2019-07-16 DIAGNOSIS — Z1211 Encounter for screening for malignant neoplasm of colon: Secondary | ICD-10-CM | POA: Diagnosis not present

## 2019-07-16 DIAGNOSIS — E559 Vitamin D deficiency, unspecified: Secondary | ICD-10-CM | POA: Diagnosis not present

## 2019-07-16 LAB — COMPREHENSIVE METABOLIC PANEL
ALT: 18 U/L (ref 0–53)
AST: 14 U/L (ref 0–37)
Albumin: 3.9 g/dL (ref 3.5–5.2)
Alkaline Phosphatase: 79 U/L (ref 39–117)
BUN: 17 mg/dL (ref 6–23)
CO2: 29 mEq/L (ref 19–32)
Calcium: 9.8 mg/dL (ref 8.4–10.5)
Chloride: 105 mEq/L (ref 96–112)
Creatinine, Ser: 0.93 mg/dL (ref 0.40–1.50)
GFR: 79.46 mL/min (ref >60.00)
Glucose, Bld: 112 mg/dL — ABNORMAL HIGH (ref 70–99)
Potassium: 4.6 mEq/L (ref 3.5–5.1)
Sodium: 139 mEq/L (ref 135–145)
Total Bilirubin: 0.7 mg/dL (ref 0.2–1.2)
Total Protein: 6.4 g/dL (ref 6.0–8.3)

## 2019-07-16 LAB — URINALYSIS, ROUTINE W REFLEX MICROSCOPIC
Bilirubin Urine: NEGATIVE
Hgb urine dipstick: NEGATIVE
Ketones, ur: NEGATIVE
Leukocytes,Ua: NEGATIVE
Nitrite: NEGATIVE
RBC / HPF: NONE SEEN (ref 0–?)
Specific Gravity, Urine: 1.02 (ref 1.000–1.030)
Total Protein, Urine: NEGATIVE
Urine Glucose: NEGATIVE
Urobilinogen, UA: 0.2 (ref 0.0–1.0)
pH: 7 (ref 5.0–8.0)

## 2019-07-16 LAB — CBC
HCT: 42.6 % (ref 39.0–52.0)
Hemoglobin: 14.5 g/dL (ref 13.0–17.0)
MCHC: 34 g/dL (ref 30.0–36.0)
MCV: 93.8 fl (ref 78.0–100.0)
Platelets: 201 10*3/uL (ref 150.0–400.0)
RBC: 4.55 Mil/uL (ref 4.22–5.81)
RDW: 13.4 % (ref 11.5–15.5)
WBC: 6 10*3/uL (ref 4.0–10.5)

## 2019-07-16 LAB — LIPID PANEL
Cholesterol: 184 mg/dL (ref 0–200)
HDL: 39.1 mg/dL (ref >39.00)
LDL Cholesterol: 118 mg/dL — ABNORMAL HIGH (ref 0–99)
NonHDL: 144.64
Total CHOL/HDL Ratio: 5
Triglycerides: 132 mg/dL (ref 0.0–149.0)
VLDL: 26.4 mg/dL (ref 0.0–40.0)

## 2019-07-16 LAB — VITAMIN D 25 HYDROXY (VIT D DEFICIENCY, FRACTURES): VITD: 43.91 ng/mL (ref 30.00–100.00)

## 2019-07-16 LAB — PSA: PSA: 0.02 ng/mL — ABNORMAL LOW (ref 0.10–4.00)

## 2019-07-16 NOTE — Patient Instructions (Addendum)
Health Maintenance After Age 74 After age 39, you are at a higher risk for certain long-term diseases and infections as well as injuries from falls. Falls are a major cause of broken bones and head injuries in people who are older than age 66. Getting regular preventive care can help to keep you healthy and well. Preventive care includes getting regular testing and making lifestyle changes as recommended by your health care provider. Talk with your health care provider about:  Which screenings and tests you should have. A screening is a test that checks for a disease when you have no symptoms.  A diet and exercise plan that is right for you. What should I know about screenings and tests to prevent falls? Screening and testing are the best ways to find a health problem early. Early diagnosis and treatment give you the best chance of managing medical conditions that are common after age 8. Certain conditions and lifestyle choices may make you more likely to have a fall. Your health care provider may recommend:  Regular vision checks. Poor vision and conditions such as cataracts can make you more likely to have a fall. If you wear glasses, make sure to get your prescription updated if your vision changes.  Medicine review. Work with your health care provider to regularly review all of the medicines you are taking, including over-the-counter medicines. Ask your health care provider about any side effects that may make you more likely to have a fall. Tell your health care provider if any medicines that you take make you feel dizzy or sleepy.  Osteoporosis screening. Osteoporosis is a condition that causes the bones to get weaker. This can make the bones weak and cause them to break more easily.  Blood pressure screening. Blood pressure changes and medicines to control blood pressure can make you feel dizzy.  Strength and balance checks. Your health care provider may recommend certain tests to check your  strength and balance while standing, walking, or changing positions.  Foot health exam. Foot pain and numbness, as well as not wearing proper footwear, can make you more likely to have a fall.  Depression screening. You may be more likely to have a fall if you have a fear of falling, feel emotionally low, or feel unable to do activities that you used to do.  Alcohol use screening. Using too much alcohol can affect your balance and may make you more likely to have a fall. What actions can I take to lower my risk of falls? General instructions  Talk with your health care provider about your risks for falling. Tell your health care provider if: ? You fall. Be sure to tell your health care provider about all falls, even ones that seem minor. ? You feel dizzy, sleepy, or off-balance.  Take over-the-counter and prescription medicines only as told by your health care provider. These include any supplements.  Eat a healthy diet and maintain a healthy weight. A healthy diet includes low-fat dairy products, low-fat (lean) meats, and fiber from whole grains, beans, and lots of fruits and vegetables. Home safety  Remove any tripping hazards, such as rugs, cords, and clutter.  Install safety equipment such as grab bars in bathrooms and safety rails on stairs.  Keep rooms and walkways well-lit. Activity   Follow a regular exercise program to stay fit. This will help you maintain your balance. Ask your health care provider what types of exercise are appropriate for you.  If you need a cane or  walker, use it as recommended by your health care provider.  Wear supportive shoes that have nonskid soles. Lifestyle  Do not drink alcohol if your health care provider tells you not to drink.  If you drink alcohol, limit how much you have: ? 0-1 drink a day for women. ? 0-2 drinks a day for men.  Be aware of how much alcohol is in your drink. In the [place] one drink equals one typical bottle of beer (12  oz), one-half glass of wine (5 oz), or one shot of hard liquor (1 oz).  Do not use any products that contain nicotine or tobacco, such as cigarettes and e-cigarettes. If you need help quitting, ask your health care provider. Summary  Having a healthy lifestyle and getting preventive care can help to protect your health and wellness after age 65.  Screening and testing are the best way to find a health problem early and help you avoid having a fall. Early diagnosis and treatment give you the best chance for managing medical conditions that are more common for people who are older than age 65.  Falls are a major cause of broken bones and head injuries in people who are older than age 65. Take precautions to prevent a fall at home.  Work with your health care provider to learn what changes you can make to improve your health and wellness and to prevent falls. This information is not intended to replace advice given to you by your health care provider. Make sure you discuss any questions you have with your health care provider. Document Released: 08/27/2017 Document Revised: 02/04/2019 Document Reviewed: 08/27/2017 Elsevier Patient Education  2020 Elsevier Inc.  Preventive Care 65 Years and Older, Male Preventive care refers to lifestyle choices and visits with your health care provider that can promote health and wellness. This includes:  A yearly physical exam. This is also called an annual well check.  Regular dental and eye exams.  Immunizations.  Screening for certain conditions.  Healthy lifestyle choices, such as diet and exercise. What can I expect for my preventive care visit? Physical exam Your health care provider will check:  Height and weight. These may be used to calculate body mass index (BMI), which is a measurement that tells if you are at a healthy weight.  Heart rate and blood pressure.  Your skin for abnormal spots. Counseling Your health care provider may ask  you questions about:  Alcohol, tobacco, and drug use.  Emotional well-being.  Home and relationship well-being.  Sexual activity.  Eating habits.  History of falls.  Memory and ability to understand (cognition).  Work and work environment. What immunizations do I need?  Influenza (flu) vaccine  This is recommended every year. Tetanus, diphtheria, and pertussis (Tdap) vaccine  You may need a Td booster every 10 years. Varicella (chickenpox) vaccine  You may need this vaccine if you have not already been vaccinated. Zoster (shingles) vaccine  You may need this after age 60. Pneumococcal conjugate (PCV13) vaccine  One dose is recommended after age 65. Pneumococcal polysaccharide (PPSV23) vaccine  One dose is recommended after age 65. Measles, mumps, and rubella (MMR) vaccine  You may need at least one dose of MMR if you were born in 1957 or later. You may also need a second dose. Meningococcal conjugate (MenACWY) vaccine  You may need this if you have certain conditions. Hepatitis A vaccine  You may need this if you have certain conditions or if you travel or work   in places where you may be exposed to hepatitis A. Hepatitis B vaccine  You may need this if you have certain conditions or if you travel or work in places where you may be exposed to hepatitis B. Haemophilus influenzae type b (Hib) vaccine  You may need this if you have certain conditions. You may receive vaccines as individual doses or as more than one vaccine together in one shot (combination vaccines). Talk with your health care provider about the risks and benefits of combination vaccines. What tests do I need? Blood tests  Lipid and cholesterol levels. These may be checked every 5 years, or more frequently depending on your overall health.  Hepatitis C test.  Hepatitis B test. Screening  Lung cancer screening. You may have this screening every year starting at age 48 if you have a  30-pack-year history of smoking and currently smoke or have quit within the past 15 years.  Colorectal cancer screening. All adults should have this screening starting at age 32 and continuing until age 2. Your health care provider may recommend screening at age 65 if you are at increased risk. You will have tests every 1-10 years, depending on your results and the type of screening test.  Prostate cancer screening. Recommendations will vary depending on your family history and other risks.  Diabetes screening. This is done by checking your blood sugar (glucose) after you have not eaten for a while (fasting). You may have this done every 1-3 years.  Abdominal aortic aneurysm (AAA) screening. You may need this if you are a current or former smoker.  Sexually transmitted disease (STD) testing. Follow these instructions at home: Eating and drinking  Eat a diet that includes fresh fruits and vegetables, whole grains, lean protein, and low-fat dairy products. Limit your intake of foods with high amounts of sugar, saturated fats, and salt.  Take vitamin and mineral supplements as recommended by your health care provider.  Do not drink alcohol if your health care provider tells you not to drink.  If you drink alcohol: ? Limit how much you have to 0-2 drinks a day. ? Be aware of how much alcohol is in your drink. In the [place] one drink equals one 12 oz bottle of beer (355 mL), one 5 oz glass of wine (148 mL), or one 1 oz glass of hard liquor (44 mL). Lifestyle  Take daily care of your teeth and gums.  Stay active. Exercise for at least 30 minutes on 5 or more days each week.  Do not use any products that contain nicotine or tobacco, such as cigarettes, e-cigarettes, and chewing tobacco. If you need help quitting, ask your health care provider.  If you are sexually active, practice safe sex. Use a condom or other form of protection to prevent STIs (sexually transmitted infections).  Talk  with your health care provider about taking a low-dose aspirin or statin. What's next?  Visit your health care provider once a year for a well check visit.  Ask your health care provider how often you should have your eyes and teeth checked.  Stay up to date on all vaccines. This information is not intended to replace advice given to you by your health care provider. Make sure you discuss any questions you have with your health care provider. Document Released: 11/10/2015 Document Revised: 10/08/2018 Document Reviewed: 10/08/2018 Elsevier Patient Education  2020 [employer].  Exercising to Lose Weight Exercise is structured, repetitive physical activity to improve fitness and health. Getting  regular exercise is important for everyone. It is especially important if you are overweight. Being overweight increases your risk of heart disease, stroke, diabetes, high blood pressure, and several types of cancer. Reducing your calorie intake and exercising can help you lose weight. Exercise is usually categorized as moderate or vigorous intensity. To lose weight, most people need to do a certain amount of moderate-intensity or vigorous-intensity exercise each week. Moderate-intensity exercise  Moderate-intensity exercise is any activity that gets you moving enough to burn at least three times more energy (calories) than if you were sitting. Examples of moderate exercise include:  Walking a mile in 15 minutes.  Doing light yard work.  Biking at an easy pace. Most people should get at least 150 minutes (2 hours and 30 minutes) a week of moderate-intensity exercise to maintain their body weight. Vigorous-intensity exercise Vigorous-intensity exercise is any activity that gets you moving enough to burn at least six times more calories than if you were sitting. When you exercise at this intensity, you should be working hard enough that you are not able to carry on a conversation. Examples of vigorous  exercise include:  Running.  Playing a team sport, such as football, basketball, and soccer.  Jumping rope. Most people should get at least 75 minutes (1 hour and 15 minutes) a week of vigorous-intensity exercise to maintain their body weight. How can exercise affect me? When you exercise enough to burn more calories than you eat, you lose weight. Exercise also reduces body fat and builds muscle. The more muscle you have, the more calories you burn. Exercise also:  Improves mood.  Reduces stress and tension.  Improves your overall fitness, flexibility, and endurance.  Increases bone strength. The amount of exercise you need to lose weight depends on:  Your age.  The type of exercise.  Any health conditions you have.  Your overall physical ability. Talk to your health care provider about how much exercise you need and what types of activities are safe for you. What actions can I take to lose weight? Nutrition   Make changes to your diet as told by your health care provider or diet and nutrition specialist (dietitian). This may include: ? Eating fewer calories. ? Eating more protein. ? Eating less unhealthy fats. ? Eating a diet that includes fresh fruits and vegetables, whole grains, low-fat dairy products, and lean protein. ? Avoiding foods with added fat, salt, and sugar.  Drink plenty of water while you exercise to prevent dehydration or heat stroke. Activity  Choose an activity that you enjoy and set realistic goals. Your health care provider can help you make an exercise plan that works for you.  Exercise at a moderate or vigorous intensity most days of the week. ? The intensity of exercise may vary from person to person. You can tell how intense a workout is for you by paying attention to your breathing and heartbeat. Most people will notice their breathing and heartbeat get faster with more intense exercise.  Do resistance training twice each week, such as: ?  Push-ups. ? Sit-ups. ? Lifting weights. ? Using resistance bands.  Getting short amounts of exercise can be just as helpful as long structured periods of exercise. If you have trouble finding time to exercise, try to include exercise in your daily routine. ? Get up, stretch, and walk around every 30 minutes throughout the day. ? Go for a walk during your lunch break. ? Park your car farther away from your destination. ?  If you take public transportation, get off one stop early and walk the rest of the way. ? Make phone calls while standing up and walking around. ? Take the stairs instead of elevators or escalators.  Wear comfortable clothes and shoes with good support.  Do not exercise so much that you hurt yourself, feel dizzy, or get very short of breath. Where to find more information  [place] Department of Health and Human Services: [employer].at  Centers for Disease Control and Prevention (CDC): http://www.wolf.info/ Contact a health care provider:  Before starting a new exercise program.  If you have questions or concerns about your weight.  If you have a medical problem that keeps you from exercising. Get help right away if you have any of the following while exercising:  Injury.  Dizziness.  Difficulty breathing or shortness of breath that does not go away when you stop exercising.  Chest pain.  Rapid heartbeat. Summary  Being overweight increases your risk of heart disease, stroke, diabetes, high blood pressure, and several types of cancer.  Losing weight happens when you burn more calories than you eat.  Reducing the amount of calories you eat in addition to getting regular moderate or vigorous exercise each week helps you lose weight. This information is not intended to replace advice given to you by your health care provider. Make sure you discuss any questions you have with your health care provider. Document Released: 11/16/2010 Document Revised: 10/27/2017 Document  Reviewed: 10/27/2017 Elsevier Patient Education  2020 [employer].

## 2019-07-16 NOTE — Progress Notes (Addendum)
Established Patient Office Visit  Subjective:  Patient ID: Kyle Strickland, male    DOB: 07/13/1946  Age: 73 y.o. MRN: [id number]  CC:  Chief Complaint  Patient presents with  . Annual Exam    HPI Kyle Strickland presents for his yearly physical.  He has been doing well.  He was able to sleep dentist he had his wisdom teeth extracted.  Has not seen a eye doctor yet.  Not exercising as much as he would like.  Continues to work full-time.  Has been able to keep his company going in has not laid anyone off yet.  They are slow but still working.  Continues to lead a relatively healthy lifestyle but not smoking and rarely drinking.  Continues to decline a colonoscopy but has decided that he will go ahead and plan on having a Cologuard test performed.  Past Medical History:  Diagnosis Date  . Cancer [facility]) 2011   prostate  . Elevated blood-pressure reading without diagnosis of hypertension   . Other and unspecified hyperlipidemia     Past Surgical History:  Procedure Laterality Date  . no colonoscopy     SOC reviewed  . PROSTATECTOMY  2011   Dr Darcus Austin, [facility]    Family History  Problem Relation Age of Onset  . Cancer Maternal Grandfather        bladder  . Diabetes Paternal Grandfather        TIAs; CVA  . Stroke Paternal Grandfather        early 21s  . Heart attack Mother 16  . Transient ischemic attack Paternal Uncle   . Dementia Father        CVAs  . Diabetes Father        borderline    Social History   Socioeconomic History  . Marital status: Married    Spouse name: Not on file  . Number of children: 2  . Years of education: 29  . Highest education level: Not on file  Occupational History  . Not on file  Social Needs  . Financial resource strain: Not on file  . Food insecurity    Worry: Not on file    Inability: Not on file  . Transportation needs    Medical: Not on file    Non-medical: Not on file  Tobacco Use  . Smoking status: Never Smoker  . Smokeless  tobacco: Never Used  Substance and Sexual Activity  . Alcohol use: Yes    Comment: Rarely  . Drug use: No  . Sexual activity: Not on file  Lifestyle  . Physical activity    Days per week: Not on file    Minutes per session: Not on file  . Stress: Not on file  Relationships  . Social Herbalist on phone: Not on file    Gets together: Not on file    Attends religious service: Not on file    Active member of club or organization: Not on file    Attends meetings of clubs or organizations: Not on file    Relationship status: Not on file  . Intimate partner violence    Fear of current or ex partner: Not on file    Emotionally abused: Not on file    Physically abused: Not on file    Forced sexual activity: Not on file  Other Topics Concern  . Not on file  Social History Narrative   Fun: Motorcycles, golf     Outpatient Medications  Prior to Visit  Medication Sig Dispense Refill  . aspirin 81 MG tablet Take 81 mg by mouth daily.    . Cholecalciferol (VITAMIN D3) 1000 UNITS CAPS Take by mouth daily.      . cyanocobalamin 100 MCG tablet Take 100 mcg by mouth daily.    . fish oil-omega-3 fatty acids 1000 MG capsule Take 1 g by mouth daily.    . Magnesium 400 MG CAPS Take 1 tablet by mouth daily.    . Multiple Vitamin (MULTIVITAMINS PO) Take by mouth daily.      Marland Kitchen tobramycin (TOBREX) 0.3 % ophthalmic solution Place 2 drops into the right eye every 6 (six) hours. 5 mL 0   No facility-administered medications prior to visit.     Allergies  Allergen Reactions  . Ciprofloxacin     Hives post op  . Ditropan [Oxybutynin Chloride]     Hives 2011, post op  . Codeine     Nausea and headaches     ROS Review of Systems  Constitutional: Negative.   HENT: Negative.   Eyes: Negative for photophobia and visual disturbance.  Respiratory: Negative.   Cardiovascular: Negative.   Gastrointestinal: Negative.   Endocrine: Negative for polyphagia and polyuria.  Genitourinary:  Negative for difficulty urinating, frequency and hematuria.  Musculoskeletal: Negative for gait problem and joint swelling.  Skin: Negative for pallor and rash.  Allergic/Immunologic: Negative for immunocompromised state.  Neurological: Negative for light-headedness and numbness.  Hematological: Does not bruise/bleed easily.  Psychiatric/Behavioral: Negative.       Objective:    Physical Exam  Constitutional: He is oriented to person, place, and time. He appears well-developed and well-nourished. No distress.  HENT:  Head: Normocephalic and atraumatic.  Right Ear: External ear normal.  Left Ear: External ear normal.  Mouth/Throat: Oropharynx is clear and moist. No oropharyngeal exudate.  Eyes: Pupils are equal, round, and reactive to light. Conjunctivae are normal. Right eye exhibits no discharge. Left eye exhibits no discharge. No scleral icterus.  Neck: Neck supple. No JVD present. No tracheal deviation present. No thyromegaly present.  Cardiovascular: Normal rate, regular rhythm and normal heart sounds.  Pulmonary/Chest: Effort normal and breath sounds normal. No stridor.  Abdominal: Soft. Bowel sounds are normal. He exhibits no distension. There is no abdominal tenderness. There is no rebound and no guarding. Hernia confirmed negative in the right inguinal area and confirmed negative in the left inguinal area.  Genitourinary: Right testis shows no mass, no swelling and no tenderness. Right testis is descended. Left testis shows no swelling and no tenderness. Left testis is descended. Circumcised. No hypospadias, penile erythema or penile tenderness. No discharge found.  Musculoskeletal:        General: No edema.  Lymphadenopathy:    He has no cervical adenopathy.       Right: No inguinal adenopathy present.       Left: No inguinal adenopathy present.  Neurological: He is alert and oriented to person, place, and time.  Skin: Skin is warm and dry. He is not diaphoretic.   Psychiatric: He has a normal mood and affect. His behavior is normal.   The 10-year ASCVD risk score Mikey Bussing DC Jr., et al., 2013) is: 21%   Values used to calculate the score:     Age: 61 years     Sex: Male     Is Non-Hispanic African American: No     Diabetic: No     Tobacco smoker: No     Systolic Blood  Pressure: 120 mmHg     Is BP treated: No     HDL Cholesterol: 39.1 mg/dL     Total Cholesterol: 184 mg/dL BP 120/76   Pulse 70   Ht 5\' 11"  (1.803 m)   Wt 231 lb (104.8 kg)   SpO2 95%   BMI 32.22 kg/m  Wt Readings from Last 3 Encounters:  07/16/19 231 lb (104.8 kg)  07/14/18 236 lb (107 kg)  12/03/17 236 lb 2 oz (107.1 kg)   BP Readings from Last 3 Encounters:  07/16/19 120/76  07/14/18 128/80  12/03/17 136/80   Guideline developer:  UpToDate (see UpToDate for funding source) Date Released: June 2014  Health Maintenance Due  Topic Date Due  . Samul Dada  09/08/1964  . COLONOSCOPY  09/09/1995    There are no preventive care reminders to display for this patient.  Lab Results  Component Value Date   TSH 1.73 07/14/2018   Lab Results  Component Value Date   WBC 5.7 07/14/2018   HGB 14.7 07/14/2018   HCT 42.3 07/14/2018   MCV 91.9 07/14/2018   PLT 209.0 07/14/2018   Lab Results  Component Value Date   NA 140 07/14/2018   K 4.4 07/14/2018   CO2 23 07/14/2018   GLUCOSE 124 (H) 07/14/2018   BUN 20 07/14/2018   CREATININE 0.92 07/14/2018   BILITOT 0.6 07/14/2018   ALKPHOS 79 07/14/2018   AST 17 07/14/2018   ALT 22 07/14/2018   PROT 6.8 07/14/2018   ALBUMIN 4.0 07/14/2018   CALCIUM 9.9 07/14/2018   GFR 85.75 07/14/2018   Lab Results  Component Value Date   CHOL 187 07/14/2018   Lab Results  Component Value Date   HDL 38.00 (L) 07/14/2018   Lab Results  Component Value Date   LDLCALC 127 (H) 07/14/2018   Lab Results  Component Value Date   TRIG 110.0 07/14/2018   Lab Results  Component Value Date   CHOLHDL 5 07/14/2018   Lab Results   Component Value Date   HGBA1C 5.7 05/29/2015      Assessment & Plan:   Problem List Items Addressed This Visit      Genitourinary   Prostate cancer ([place])   Relevant Orders   PSA     Other   Obesity   Screen for colon cancer   Vitamin D deficiency   Relevant Orders   VITAMIN D 25 Hydroxy (Vit-D Deficiency, Fractures)    Other Visit Diagnoses    Encounter for health maintenance examination with abnormal findings    -  Primary   Relevant Orders   CBC   Comprehensive metabolic panel   Lipid panel   Urinalysis, Routine w reflex microscopic   POC Hemoccult Bld/Stl (3-Cd Home Screen)      No orders of the defined types were placed in this encounter.   Follow-up: Return in about 6 months (around 01/13/2020).   Patient was given information on health maintenance and disease prevention.  Encouraged him to go ahead with the Cologuard test.  He was given Hemoccults.

## 2020-03-20 ENCOUNTER — Telehealth: Payer: Self-pay | Admitting: Family Medicine

## 2020-03-20 NOTE — Telephone Encounter (Signed)
Patient is calling and wanted to see if Dr. Ethelene Hal can put in an order to get a DPT injection. CB is [phone]

## 2020-04-05 NOTE — Telephone Encounter (Signed)
WK-Plz see req for order for DPT injection/thx dmf

## 2020-04-06 NOTE — Telephone Encounter (Signed)
Not on testosterone ?

## 2020-10-11 ENCOUNTER — Telehealth: Payer: Self-pay | Admitting: Family Medicine

## 2020-10-11 ENCOUNTER — Encounter: Payer: Self-pay | Admitting: Family Medicine

## 2020-10-11 NOTE — Telephone Encounter (Signed)
Okay to schedule a visit with me at his convenience.

## 2020-10-11 NOTE — Telephone Encounter (Signed)
Added Dr. Larose Kells and Hilary Hertz asked that you route back to her so she can contact pt. Thank you.

## 2020-10-11 NOTE — Telephone Encounter (Signed)
Copied from Teams messaging  [09/29/2020 1:18 PM] Inocente Salles Pt Kyle Strickland, MRN [id number], is asking to transfer to a different office specifically to a male MD. He was scheduled for a cpe and has been rescheduled twice. Dr. Ethelene Hal is out for surgery and not able to return when initially expected. This was disappointing to him and I have no other male MD to offer. He is requesting a cpe before the end of the year if at all possible and lives in [place]. Can you all squeeze him in with Dr. Larose Kells possibly for TOC/CPE?  [8:40 AM] Inocente Salles Good morning. Were you able to get Kyle Strickland [phone] worked in for a new pt/cpe with Dr. Larose Kells?

## 2020-10-12 NOTE — Telephone Encounter (Signed)
Spoke with patient.  He is scheduled for [facility] appt with Dr. Larose Kells on 10/23/20.

## 2020-10-23 ENCOUNTER — Ambulatory Visit (INDEPENDENT_AMBULATORY_CARE_PROVIDER_SITE_OTHER): Payer: Medicare Other | Admitting: Internal Medicine

## 2020-10-23 ENCOUNTER — Encounter: Payer: Self-pay | Admitting: Internal Medicine

## 2020-10-23 ENCOUNTER — Other Ambulatory Visit: Payer: Self-pay

## 2020-10-23 VITALS — BP 133/80 | HR 66 | Temp 98.1°F | Ht 71.0 in | Wt 221.8 lb

## 2020-10-23 DIAGNOSIS — Z23 Encounter for immunization: Secondary | ICD-10-CM

## 2020-10-23 DIAGNOSIS — R030 Elevated blood-pressure reading, without diagnosis of hypertension: Secondary | ICD-10-CM

## 2020-10-23 DIAGNOSIS — Z8546 Personal history of malignant neoplasm of prostate: Secondary | ICD-10-CM

## 2020-10-23 DIAGNOSIS — Z0000 Encounter for general adult medical examination without abnormal findings: Secondary | ICD-10-CM

## 2020-10-23 DIAGNOSIS — G629 Polyneuropathy, unspecified: Secondary | ICD-10-CM | POA: Diagnosis not present

## 2020-10-23 LAB — LIPID PANEL
Cholesterol: 200 mg/dL (ref 0–200)
HDL: 41.7 mg/dL (ref >39.00)
LDL Cholesterol: 129 mg/dL — ABNORMAL HIGH (ref 0–99)
NonHDL: 158.23
Total CHOL/HDL Ratio: 5
Triglycerides: 146 mg/dL (ref 0.0–149.0)
VLDL: 29.2 mg/dL (ref 0.0–40.0)

## 2020-10-23 LAB — COMPREHENSIVE METABOLIC PANEL
ALT: 16 U/L (ref 0–53)
AST: 15 U/L (ref 0–37)
Albumin: 4.1 g/dL (ref 3.5–5.2)
Alkaline Phosphatase: 83 U/L (ref 39–117)
BUN: 19 mg/dL (ref 6–23)
CO2: 29 mEq/L (ref 19–32)
Calcium: 9.7 mg/dL (ref 8.4–10.5)
Chloride: 104 mEq/L (ref 96–112)
Creatinine, Ser: 0.97 mg/dL (ref 0.40–1.50)
GFR: 76.57 mL/min (ref >60.00)
Glucose, Bld: 102 mg/dL — ABNORMAL HIGH (ref 70–99)
Potassium: 4.4 mEq/L (ref 3.5–5.1)
Sodium: 139 mEq/L (ref 135–145)
Total Bilirubin: 0.9 mg/dL (ref 0.2–1.2)
Total Protein: 6.7 g/dL (ref 6.0–8.3)

## 2020-10-23 LAB — CBC WITH DIFFERENTIAL/PLATELET
Basophils Absolute: 0 10*3/uL (ref 0.0–0.1)
Basophils Relative: 0.7 % (ref 0.0–3.0)
Eosinophils Absolute: 0.2 10*3/uL (ref 0.0–0.7)
Eosinophils Relative: 3.3 % (ref 0.0–5.0)
HCT: 42.8 % (ref 39.0–52.0)
Hemoglobin: 14.7 g/dL (ref 13.0–17.0)
Lymphocytes Relative: 21 % (ref 12.0–46.0)
Lymphs Abs: 1.2 10*3/uL (ref 0.7–4.0)
MCHC: 34.3 g/dL (ref 30.0–36.0)
MCV: 92.7 fl (ref 78.0–100.0)
Monocytes Absolute: 0.8 10*3/uL (ref 0.1–1.0)
Monocytes Relative: 13.8 % — ABNORMAL HIGH (ref 3.0–12.0)
Neutro Abs: 3.6 10*3/uL (ref 1.4–7.7)
Neutrophils Relative %: 61.2 % (ref 43.0–77.0)
Platelets: 213 10*3/uL (ref 150.0–400.0)
RBC: 4.62 Mil/uL (ref 4.22–5.81)
RDW: 13 % (ref 11.5–15.5)
WBC: 5.9 10*3/uL (ref 4.0–10.5)

## 2020-10-23 LAB — VITAMIN D 25 HYDROXY (VIT D DEFICIENCY, FRACTURES): VITD: 50.71 ng/mL (ref 30.00–100.00)

## 2020-10-23 LAB — VITAMIN B12: Vitamin B-12: 523 pg/mL (ref 211–911)

## 2020-10-23 LAB — FOLATE: Folate: >23.6 ng/mL (ref >5.9)

## 2020-10-23 LAB — TSH: TSH: 1.46 u[IU]/mL (ref 0.35–4.50)

## 2020-10-23 LAB — PSA: PSA: 0.02 ng/mL — ABNORMAL LOW (ref 0.10–4.00)

## 2020-10-23 NOTE — Patient Instructions (Addendum)
Proceed with a Covid booster at your earliest convenience  Return personally in the stool test Okay to stop aspirin GO TO THE LAB : Get the blood work     GO TO THE FRONT DESK, PLEASE SCHEDULE YOUR APPOINTMENTS Come back for a physical exam in 1 year.

## 2020-10-23 NOTE — Progress Notes (Signed)
Subjective:    Patient ID: Kyle Strickland, male    DOB: 1945/11/10, 74 y.o.   MRN: [id number]  DOS:  10/23/2020 Type of visit - description: New patient to me, request CPX  In general feels well. Over the last year has noted that his toes on the distal plantar aspect of the feet are numb.  This is not consistent or daily. Balance is slightly off, no falls. Denies any major problems with back pain. No strokelike symptoms No urinary incontinence or difficulty urinating.  Review of Systems  Other than above, a 14 point review of systems is negative     Past Medical History:  Diagnosis Date  . Elevated blood-pressure reading without diagnosis of hypertension   . Other and unspecified hyperlipidemia   . Prostate cancer (HCC) 2011  . Vitamin D deficiency     Past Surgical History:  Procedure Laterality Date  . PROSTATECTOMY  2011   Dr Carin Primrose, [facility]    Allergies as of 10/23/2020      Reactions   Ciprofloxacin    Hives post op   Ditropan [oxybutynin Chloride]    Hives 2011, post op   Codeine    Nausea and headaches       Medication List       Accurate as of October 23, 2020 11:59 PM. If you have any questions, ask your nurse or doctor.        STOP taking these medications   aspirin 81 MG tablet Stopped by: Willow Ora, MD   tobramycin 0.3 % ophthalmic solution Commonly known as: TOBREX Stopped by: Willow Ora, MD     TAKE these medications   cyanocobalamin 100 MCG tablet Take 100 mcg by mouth daily.   fish oil-omega-3 fatty acids 1000 MG capsule Take 1 g by mouth daily.   Magnesium 400 MG Caps Take 1 tablet by mouth daily.   MULTIVITAMINS PO Take by mouth daily.   Vitamin D3 25 MCG (1000 UT) Caps Take by mouth daily.          Objective:   Physical Exam BP 133/80 (BP Location: Right Arm, Patient Position: Sitting, Cuff Size: Large)   Pulse 66   Temp 98.1 F (36.7 C) (Oral)   Ht 5\' 11"  (1.803 m)   Wt 221 lb 12.8 oz (100.6 kg)   SpO2 97%   BMI  30.93 kg/m  General: Well developed, NAD, BMI noted Neck: No  thyromegaly  HEENT:  Normocephalic . Face symmetric, atraumatic Lungs:  CTA B Normal respiratory effort, no intercostal retractions, no accessory muscle use. Heart: RRR,  no murmur.  Abdomen:  Not distended, soft, non-tender. No rebound or rigidity.   Lower extremities: no pretibial edema bilaterally  Skin: Exposed areas without rash. Not pale. Not jaundice Neurologic:  alert & oriented X3.  Speech normal, gait appropriate for age and unassisted Strength symmetric and appropriate for age. DTR symmetric.  Pinprick examination of the lower extremities with few areas of decreased sensation. Psych: Cognition and judgment appear intact.  Cooperative with normal attention span and concentration.  Behavior appropriate. No anxious or depressed appearing.     Assessment      Assessment Elevated BP without HTN Hyperlipidemia H/o  prostate cancer: surgery at [facility] last urology visit ~ 2015 Obesity  Vitamin D deficiency  Plan: Here for CPX Elevated BP without HTN: Ambulatory BPs are in the 130s/70. Hyperlipidemia: Diet controlled, checking labs Vitamin D deficiency: On supplements. Neuropathy: Reports 1 year history of on and  off numbness at the distal lower extremities and some difficulty with balance.  Pinprick examination of the toes confirms areas of numbness. In addition to routine labs we will check a 123456, 123456, folic acid, vitamin D.  We discussed further work-up/NCS/neuro referral, we agreed to wait and see.  If symptoms increase he will let me know. RTC 1 year  In addition to CPX, we addressed new problem: Neuropathy and his chronic medical issues.   This visit occurred during the SARS-CoV-2 public health emergency.  Safety protocols were in place, including screening questions prior to the visit, additional usage of staff PPE, and extensive cleaning of exam room while observing appropriate contact time as  indicated for disinfecting solutions.

## 2020-10-24 ENCOUNTER — Encounter: Payer: Self-pay | Admitting: Internal Medicine

## 2020-10-24 NOTE — Assessment & Plan Note (Signed)
Tdap 2021 PNM 13: 2016; PNM 23: 2018 Shingrix: Getting a flu shot today and needs a Covid booster, reassess on next CPX COVID vaccine x2, booster recommended Flu shot: Today CCS: Never had a colonoscopy, 3 options discussed, elected I fob H/o prostate cancer: Doing well, currently follow-up by PCP with labs yearly, check a PSA. Labs: CMP, FLP, CBC, A1c, TSH, PSA, vitamin D, B12, folic acid

## 2020-10-26 ENCOUNTER — Other Ambulatory Visit (INDEPENDENT_AMBULATORY_CARE_PROVIDER_SITE_OTHER): Payer: Medicare Other

## 2020-10-26 ENCOUNTER — Encounter: Payer: 59 | Admitting: Family Medicine

## 2020-10-26 DIAGNOSIS — G629 Polyneuropathy, unspecified: Secondary | ICD-10-CM | POA: Diagnosis not present

## 2020-10-26 LAB — HEMOGLOBIN A1C: Hgb A1c MFr Bld: 5.7 % (ref 4.6–6.5)

## 2020-11-03 ENCOUNTER — Ambulatory Visit: Payer: Medicare Other | Attending: Internal Medicine

## 2020-11-03 DIAGNOSIS — Z23 Encounter for immunization: Secondary | ICD-10-CM

## 2020-11-03 NOTE — Progress Notes (Signed)
   Covid-19 Vaccination Clinic  Name:  Kyle Strickland    MRN: [id number] DOB: 29-Sep-1946  11/03/2020  Mr. Grogan was observed post Covid-19 immunization for 15 minutes without incident. He was provided with Vaccine Information Sheet and instruction to access the V-Safe system.   Mr. Buda was instructed to call 911 with any severe reactions post vaccine: Marland Kitchen Difficulty breathing  . Swelling of face and throat  . A fast heartbeat  . A bad rash all over body  . Dizziness and weakness   Immunizations Administered    Name Date Dose VIS Date Route   Pfizer COVID-19 Vaccine 11/03/2020  2:14 PM 0.3 mL 08/16/2020 Intramuscular   Manufacturer: [place]   Lot: [id number]   NDC: [address]-6

## 2020-11-08 ENCOUNTER — Encounter: Payer: Self-pay | Admitting: Internal Medicine

## 2020-11-08 ENCOUNTER — Telehealth: Payer: Self-pay | Admitting: Internal Medicine

## 2020-11-08 ENCOUNTER — Telehealth (INDEPENDENT_AMBULATORY_CARE_PROVIDER_SITE_OTHER): Payer: Medicare Other | Admitting: Internal Medicine

## 2020-11-08 ENCOUNTER — Other Ambulatory Visit: Payer: Self-pay

## 2020-11-08 VITALS — BP 145/75 | HR 73 | Temp 97.0°F | Ht 71.0 in | Wt 221.0 lb

## 2020-11-08 DIAGNOSIS — U071 COVID-19: Secondary | ICD-10-CM

## 2020-11-08 NOTE — Telephone Encounter (Signed)
Caller: Jocelyn Lamer Call back # [phone]  Patient tested positive 11/07/20 / fully vaccinated  Low grade fever Chills Headache Sore throat Appetite loss  Patient would like infusion

## 2020-11-08 NOTE — Progress Notes (Unsigned)
Subjective:    Patient ID: Kyle Strickland, male    DOB: 01/03/1946, 75 y.o.   MRN: [id number]  DOS:  11/08/2020 Type of visit - description: Virtual Visit via Video Note  I connected with the above patient  by a video enabled telemedicine application and verified that I am speaking with the correct person using two identifiers.   THIS ENCOUNTER IS A VIRTUAL VISIT DUE TO COVID-19 - PATIENT WAS NOT SEEN IN THE OFFICE. PATIENT HAS CONSENTED TO VIRTUAL VISIT / TELEMEDICINE VISIT   Location of patient: home  Location of provider: office  Persons participating in the virtual visit: patient, provider   I discussed the limitations of evaluation and management by telemedicine and the availability of in person appointments. The patient expressed understanding and agreed to proceed.  Acute Symptoms started yesterday: Some throat congestion, moderate generalized aches, a steady headache about 3.5/10 in intensity. She is feeling weak and has a low appetite. He had a COVID test today and it came back positive.  Overall, today he feels better, throat discomfort gone, aches are much decreased. No fever, on Tylenol. Denies nausea, vomiting. No chest congestion. + Mild cough. No rash.      Review of Systems See above   Past Medical History:  Diagnosis Date  . Elevated blood-pressure reading without diagnosis of hypertension   . Other and unspecified hyperlipidemia   . Prostate cancer ([place]) 2011  . Vitamin D deficiency     Past Surgical History:  Procedure Laterality Date  . PROSTATECTOMY  2011   Dr Darcus Austin, [place] as of 11/08/2020      Reactions   Ciprofloxacin    Hives post op   Ditropan [oxybutynin Chloride]    Hives 2011, post op   Codeine    Nausea and headaches       Medication List       Accurate as of November 08, 2020  1:40 PM. If you have any questions, ask your nurse or doctor.        cyanocobalamin 100 MCG tablet Take 100 mcg by mouth daily.    fish oil-omega-3 fatty acids 1000 MG capsule Take 1 g by mouth daily.   Magnesium 400 MG Caps Take 1 tablet by mouth daily.   MULTIVITAMINS PO Take by mouth daily.   Vitamin D3 25 MCG (1000 UT) Caps Take by mouth daily.          Objective:   Physical Exam BP (!) 145/75   Pulse 73   Temp (!) 97 F (36.1 C) (Oral)   Ht 5\' 11"  (1.803 m)   Wt 221 lb (100.2 kg)   BMI 30.82 kg/m  This is a virtual video visit, he looks well, alert oriented x3, in no distress.    Assessment     Assessment Elevated BP without HTN Hyperlipidemia H/o  prostate cancer: surgery at [facility] last urology visit ~ 2015 Obesity  Vitamin D deficiency  Plan: COVID-19: Symptoms started yesterday, as described above, had a home test for COVID positive today. Overall he feels better today than before.  Had 3 COVID vaccinations and a flu shot. Most likely he will do well however advised him to pay close attention, if he is getting worse let me know, if severe symptoms: ER. I referred him to the treatment clinic for possible infusion or antivirals.  Otherwise supportive treatment with Robitussin, fluids, Tylenol. Quarantine rec for 7 days. Call if questions.     I  discussed the assessment and treatment plan with the patient. The patient was provided an opportunity to ask questions and all were answered. The patient agreed with the plan and demonstrated an understanding of the instructions.   The patient was advised to call back or seek an in-person evaluation if the symptoms worsen or if the condition fails to improve as anticipated.

## 2020-11-08 NOTE — Progress Notes (Unsigned)
Pre visit review using our clinic review tool, if applicable. No additional management support is needed unless otherwise documented below in the visit note. 

## 2020-11-08 NOTE — Telephone Encounter (Signed)
Needs virtual visit please.  

## 2020-11-09 ENCOUNTER — Telehealth: Payer: Self-pay | Admitting: Infectious Diseases

## 2020-11-09 ENCOUNTER — Telehealth: Payer: Self-pay

## 2020-11-09 ENCOUNTER — Other Ambulatory Visit (HOSPITAL_COMMUNITY): Payer: Self-pay | Admitting: Infectious Diseases

## 2020-11-09 DIAGNOSIS — Z09 Encounter for follow-up examination after completed treatment for conditions other than malignant neoplasm: Secondary | ICD-10-CM | POA: Insufficient documentation

## 2020-11-09 MED ORDER — NIRMATRELVIR/RITONAVIR (PAXLOVID)TABLET: 3.0000 | ORAL_TABLET | Freq: Two times a day (BID) | ORAL | 0 refills | Status: AC

## 2020-11-09 NOTE — Telephone Encounter (Signed)
Outpatient Oral COVID Treatment Note  I connected with Kyle Strickland on 11/09/2020/3:10 PM by telephone and verified that I am speaking with the correct person using two identifiers.  I discussed the limitations, risks, security, and privacy concerns of performing an evaluation and management service by telephone and the availability of in person appointments. I also discussed with the patient that there may be a patient responsible charge related to this service. The patient expressed understanding and agreed to proceed.  Patient location: [facility]  Provider location: Home   Diagnosis: COVID-19 infection  Purpose of visit: Discussion of potential use of Molnupiravir or Paxlovid, a new treatment for mild to moderate COVID-19 viral infection in non-hospitalized patients.   Subjective: Patient is a 75 y.o. male who has been diagnosed with COVID 19 viral infection.  Their symptoms began on 11/07/2020 with ache and light cough, headaches and fevers. He received booster only a few days prior to coming down with symptoms      Past Medical History:  Diagnosis Date  . Elevated blood-pressure reading without diagnosis of hypertension   . Other and unspecified hyperlipidemia   . Prostate cancer ([place]) 2011  . Vitamin D deficiency     Allergies  Allergen Reactions  . Ciprofloxacin     Hives post op  . Ditropan [Oxybutynin Chloride]     Hives 2011, post op  . Codeine     Nausea and headaches      Current Outpatient Medications:  .  Cholecalciferol (VITAMIN D3) 1000 UNITS CAPS, Take by mouth daily., Disp: , Rfl:  .  cyanocobalamin 100 MCG tablet, Take 100 mcg by mouth daily., Disp: , Rfl:  .  fish oil-omega-3 fatty acids 1000 MG capsule, Take 1 g by mouth daily., Disp: , Rfl:  .  Magnesium 400 MG CAPS, Take 1 tablet by mouth daily., Disp: , Rfl:  .  Multiple Vitamin (MULTIVITAMINS PO), Take by mouth daily., Disp: , Rfl:   Objective: Patient appears/sounds well and only mildly  symptomatic.  They are in no apparent distress.  Breathing is non labored.  Mood and behavior are normal.  Laboratory Data:  Recent Results (from the past 2160 hour(s))  Comprehensive metabolic panel     Status: Abnormal   Collection Time: 10/23/20 11:46 AM  Result Value Ref Range   Sodium 139 135 - 145 mEq/L   Potassium 4.4 3.5 - 5.1 mEq/L   Chloride 104 96 - 112 mEq/L   CO2 29 19 - 32 mEq/L   Glucose, Bld 102 (H) 70 - 99 mg/dL   BUN 19 6 - 23 mg/dL   Creatinine, Ser 0.97 0.40 - 1.50 mg/dL   Total Bilirubin 0.9 0.2 - 1.2 mg/dL   Alkaline Phosphatase 83 39 - 117 U/L   AST 15 0 - 37 U/L   ALT 16 0 - 53 U/L   Total Protein 6.7 6.0 - 8.3 g/dL   Albumin 4.1 3.5 - 5.2 g/dL   GFR 76.57 >60.00 mL/min    Comment: Calculated using the CKD-EPI Creatinine Equation (2021)   Calcium 9.7 8.4 - 10.5 mg/dL  Lipid panel     Status: Abnormal   Collection Time: 10/23/20 11:46 AM  Result Value Ref Range   Cholesterol 200 0 - 200 mg/dL    Comment: ATP III Classification       Desirable:  < 200 mg/dL               Borderline High:  200 - 239 mg/dL  High:  > = 240 mg/dL   Triglycerides 146.0 0.0 - 149.0 mg/dL    Comment: Normal:  <150 mg/dLBorderline High:  150 - 199 mg/dL   HDL 41.70 >39.00 mg/dL   VLDL 29.2 0.0 - 40.0 mg/dL   LDL Cholesterol 129 (H) 0 - 99 mg/dL   Total CHOL/HDL Ratio 5     Comment:                Men          Women1/2 Average Risk     3.4          3.3Average Risk          5.0          4.42X Average Risk          9.6          7.13X Average Risk          15.0          11.0                       NonHDL 158.23     Comment: NOTE:  Non-HDL goal should be 30 mg/dL higher than patient's LDL goal (i.e. LDL goal of < 70 mg/dL, would have non-HDL goal of < 100 mg/dL)  CBC w/Diff     Status: Abnormal   Collection Time: 10/23/20 11:46 AM  Result Value Ref Range   WBC 5.9 4.0 - 10.5 K/uL   RBC 4.62 4.22 - 5.81 Mil/uL   Hemoglobin 14.7 13.0 - 17.0 g/dL   HCT 42.8 39.0 - 52.0 %   MCV  92.7 78.0 - 100.0 fl   MCHC 34.3 30.0 - 36.0 g/dL   RDW 13.0 11.5 - 15.5 %   Platelets 213.0 150.0 - 400.0 K/uL   Neutrophils Relative % 61.2 43.0 - 77.0 %   Lymphocytes Relative 21.0 12.0 - 46.0 %   Monocytes Relative 13.8 (H) 3.0 - 12.0 %   Eosinophils Relative 3.3 0.0 - 5.0 %   Basophils Relative 0.7 0.0 - 3.0 %   Neutro Abs 3.6 1.4 - 7.7 K/uL   Lymphs Abs 1.2 0.7 - 4.0 K/uL   Monocytes Absolute 0.8 0.1 - 1.0 K/uL   Eosinophils Absolute 0.2 0.0 - 0.7 K/uL   Basophils Absolute 0.0 0.0 - 0.1 K/uL  TSH     Status: None   Collection Time: 10/23/20 11:46 AM  Result Value Ref Range   TSH 1.46 0.35 - 4.50 uIU/mL  PSA     Status: Abnormal   Collection Time: 10/23/20 11:46 AM  Result Value Ref Range   PSA 0.02 (L) 0.10 - 4.00 ng/mL    Comment: Test performed using Access Hybritech PSA Assay, a parmagnetic partical, chemiluminecent immunoassay.  VITAMIN D 25 Hydroxy (Vit-D Deficiency, Fractures)     Status: None   Collection Time: 10/23/20 11:46 AM  Result Value Ref Range   VITD 50.71 30.00 - 100.00 ng/mL  Vitamin B12     Status: None   Collection Time: 10/23/20 11:46 AM  Result Value Ref Range   Vitamin B-12 523 211 - 911 pg/mL  Folate     Status: None   Collection Time: 10/23/20 11:46 AM  Result Value Ref Range   Folate >23.6 >5.9 ng/mL  Hemoglobin A1c     Status: None   Collection Time: 10/26/20  2:55 PM  Result Value Ref Range   Hgb A1c MFr Bld 5.7 4.6 -  6.5 %    Comment: Glycemic Control Guidelines for People with Diabetes:Non Diabetic:  <6%Goal of Therapy: <7%Additional Action Suggested:  >8%      Assessment: 75 y.o. male with mild/moderate COVID 19 viral infection diagnosed on 11/08/2020 with + home test at high risk for progression to severe COVID 19.  Plan:  This patient is a 75 y.o. male that meets the following criteria for Emergency Use Authorization of: Paxlovid 1. Age >12 yr AND > 40 kg 2. SARS-COV-2 positive test 3. Symptom onset < 5 days 4. Mild-to-moderate  COVID disease with high risk for severe progression to hospitalization or death  I have spoken and communicated the following to the patient or parent/caregiver regarding: 1. Paxlovid is an unapproved drug that is authorized for use under an Emergency Use Authorization.  2. There are no adequate, approved, available products for the treatment of COVID-19 in adults who have mild-to-moderate COVID-19 and are at high risk for progressing to severe COVID-19, including hospitalization or death. 3. Other therapeutics are currently authorized. For additional information on all products authorized for treatment or prevention of COVID-19, please see [email or web address].  4. There are benefits and risks of taking this treatment as outlined in the "Fact Sheet for Patients and Caregivers."  5. "Fact Sheet for Patients and Caregivers" was reviewed with patient. A hard copy will be provided to patient from pharmacy prior to the patient receiving treatment. 6. Patients should continue to self-isolate and use infection control measures (e.g., wear mask, isolate, social distance, avoid sharing personal items, clean and disinfect "high touch" surfaces, and frequent handwashing) according to CDC guidelines.  7. The patient or parent/caregiver has the option to accept or refuse treatment. 8. Patient medication history was reviewed for potential drug interactions:No drug interactions 9. Patient's creatinine clearance was calculated to be 93 mL/min, and they were therefore prescribed Normal dose (CrCl>60) - nirmatrelvir 150mg  tab (2 tablet) by mouth twice daily AND ritonavir 100mg  tab (1 tablet) by mouth twice daily   After reviewing above information with the patient, the patient agrees to receive Paxlovid.  Follow up instructions:    . Take prescription BID x 5 days as directed . Reach out to pharmacist for  counseling on medication if desired . For concerns regarding further COVID symptoms please follow up with your PCP or urgent care . For urgent or life-threatening issues, seek care at your local emergency department  The patient was provided an opportunity to ask questions, and all were answered. The patient agreed with the plan and demonstrated an understanding of the instructions.   Script sent to [facility] and opted to pick up RX.  The patient was advised to call their PCP or seek an in-person evaluation if the symptoms worsen or if the condition fails to improve as anticipated.   I provided 14 minutes of non face-to-face telephone visit time during this encounter, and > 50% was spent counseling as documented under my assessment & plan.  Janene Madeira, NP 11/09/2020 /3:10 PM

## 2020-11-09 NOTE — Assessment & Plan Note (Signed)
COVID-19: Symptoms started yesterday, as described above, had a home test for COVID positive today. Overall he feels better today than before.  Had 3 COVID vaccinations and a flu shot. Most likely he will do well however advised him to pay close attention, if he is getting worse let me know, if severe symptoms: ER. I referred him to the treatment clinic for possible infusion or antivirals.  Otherwise supportive treatment with Robitussin, fluids, Tylenol. Quarantine rec for 7 days. Call if questions.

## 2020-11-09 NOTE — Telephone Encounter (Signed)
Called to discuss with patient about COVID-19 symptoms and the use of one of the available treatments for those with mild to moderate Covid symptoms and at a high risk of hospitalization.  Pt appears to qualify for outpatient treatment due to co-morbid conditions and/or a member of an at-risk group in accordance with the FDA Emergency Use Authorization.    Symptom onset: 11/07/20 Vaccinated: Yes Booster? Yes Immunocompromised? No Qualifiers: Prostate cancer  Unable to reach pt - Reached pt.   Kyle Strickland

## 2020-11-10 ENCOUNTER — Telehealth (HOSPITAL_COMMUNITY): Payer: Self-pay

## 2020-11-10 MED FILL — PAXLOVID 20 X 150 MG & 10 X: 20 X 150 MG | 5 days supply | Qty: 30 | Fill #0

## 2020-11-10 NOTE — Telephone Encounter (Signed)
Patient was prescribed oral covid treatment Paxlovid and treatment note was reviewed. Medication has been received by [place] and reviewed for appropriateness.  Drug Interactions or Dosage Adjustments Noted: none  Delivery Method: pick up  Patient contacted for counseling on 11/10/2020 and verbalized understanding.   Delivery or Pick-Up Date: 11/10/2020   Alinda Dooms 11/10/2020, 9:25 AM [facility] Outpatient Pharmacist Phone# [phone]

## 2020-11-17 DIAGNOSIS — H527 Unspecified disorder of refraction: Secondary | ICD-10-CM | POA: Diagnosis not present

## 2021-05-01 DIAGNOSIS — H903 Sensorineural hearing loss, bilateral: Secondary | ICD-10-CM | POA: Diagnosis not present

## 2021-07-10 ENCOUNTER — Ambulatory Visit (INDEPENDENT_AMBULATORY_CARE_PROVIDER_SITE_OTHER): Payer: Medicare Other | Admitting: Internal Medicine

## 2021-07-10 ENCOUNTER — Encounter: Payer: Self-pay | Admitting: Internal Medicine

## 2021-07-10 ENCOUNTER — Other Ambulatory Visit: Payer: Self-pay

## 2021-07-10 VITALS — BP 124/72 | HR 61 | Temp 97.6°F | Resp 16 | Ht 71.0 in | Wt 212.0 lb

## 2021-07-10 DIAGNOSIS — R030 Elevated blood-pressure reading, without diagnosis of hypertension: Secondary | ICD-10-CM

## 2021-07-10 DIAGNOSIS — E785 Hyperlipidemia, unspecified: Secondary | ICD-10-CM | POA: Diagnosis not present

## 2021-07-10 DIAGNOSIS — G629 Polyneuropathy, unspecified: Secondary | ICD-10-CM | POA: Diagnosis not present

## 2021-07-10 DIAGNOSIS — R7989 Other specified abnormal findings of blood chemistry: Secondary | ICD-10-CM | POA: Diagnosis not present

## 2021-07-10 LAB — LIPID PANEL
Cholesterol: 193 mg/dL (ref 0–200)
HDL: 42.7 mg/dL (ref >39.00)
LDL Cholesterol: 127 mg/dL — ABNORMAL HIGH (ref 0–99)
NonHDL: 149.82
Total CHOL/HDL Ratio: 5
Triglycerides: 115 mg/dL (ref 0.0–149.0)
VLDL: 23 mg/dL (ref 0.0–40.0)

## 2021-07-10 LAB — FOLATE: Folate: >24.4 ng/mL (ref >5.9)

## 2021-07-10 NOTE — Patient Instructions (Signed)
Please consider flu shot COVID-vaccine booster this fall     GO TO THE LAB : Get the blood work     [place], [place] Come back for a physical exam by December 2023   Neuropathy  and [place] care is an important part of your health, especially when you have diabetes. Diabetes may cause you to have problems because of poor blood flow (circulation) to your feet and legs, which can cause your skin to: Become thinner and drier. Break more easily. Heal more slowly. Peel and crack. You may also have nerve damage (neuropathy) in your legs and feet, causing decreased feeling in them. This means that you may not notice minor injuries to your feet that could lead to more serious problems. Noticing and addressing any potential problems early is the best way to prevent future foot problems. How to care for your feet Foot hygiene  Wash your feet daily with warm water and mild soap. Do not use hot water. Then, pat your feet and the areas between your toes until they are completely dry. Do not soak your feet as this can dry your skin. Trim your toenails straight across. Do not dig under them or around the cuticle. File the edges of your nails with an emery board or nail file. Apply a moisturizing lotion or petroleum jelly to the skin on your feet and to dry, brittle toenails. Use lotion that does not contain alcohol and is unscented. Do not apply lotion between your toes. Shoes and socks Wear clean socks or stockings every day. Make sure they are not too tight. Do not wear knee-high stockings since they may decrease blood flow to your legs. Wear shoes that fit properly and have enough cushioning. Always look in your shoes before you put them on to be sure there are no objects inside. To break in new shoes, wear them for just a few hours a day. This prevents injuries on your feet. Wounds, scrapes, corns, and calluses  Check your feet daily for blisters,  cuts, bruises, sores, and redness. If you cannot see the bottom of your feet, use a mirror or ask someone for help. Do not cut corns or calluses or try to remove them with medicine. If you find a minor scrape, cut, or break in the skin on your feet, keep it and the skin around it clean and dry. You may clean these areas with mild soap and water. Do not clean the area with peroxide, alcohol, or iodine. If you have a wound, scrape, corn, or callus on your foot, look at it several times a day to make sure it is healing and not infected. Check for: Redness, swelling, or pain. Fluid or blood. Warmth. Pus or a bad smell. General tips Do not cross your legs. This may decrease blood flow to your feet. Do not use heating pads or hot water bottles on your feet. They may burn your skin. If you have lost feeling in your feet or legs, you may not know this is happening until it is too late. Protect your feet from hot and cold by wearing shoes, such as at the beach or on hot pavement. Schedule a complete foot exam at least once a year (annually) or more often if you have foot problems. Report any cuts, sores, or bruises to your health care provider immediately. Where to find more information American Diabetes Association: www.diabetes.org Association of Diabetes Care & Education Specialists: www.diabeteseducator.org  Contact a health care provider if: You have a medical condition that increases your risk of infection and you have any cuts, sores, or bruises on your feet. You have an injury that is not healing. You have redness on your legs or feet. You feel burning or tingling in your legs or feet. You have pain or cramps in your legs and feet. Your legs or feet are numb. Your feet always feel cold. You have pain around any toenails. Get help right away if: You have a wound, scrape, corn, or callus on your foot and: You have pain, swelling, or redness that gets worse. You have fluid or blood coming from  the wound, scrape, corn, or callus. Your wound, scrape, corn, or callus feels warm to the touch. You have pus or a bad smell coming from the wound, scrape, corn, or callus. You have a fever. You have a red line going up your leg. Summary Check your feet every day for blisters, cuts, bruises, sores, and redness. Apply a moisturizing lotion or petroleum jelly to the skin on your feet and to dry, brittle toenails. Wear shoes that fit properly and have enough cushioning. If you have foot problems, report any cuts, sores, or bruises to your health care provider immediately. Schedule a complete foot exam at least once a year (annually) or more often if you have foot problems. This information is not intended to replace advice given to you by your health care provider. Make sure you discuss any questions you have with your health care provider. Document Revised: 05/04/2020 Document Reviewed: 05/04/2020 Elsevier Patient Education  [place].

## 2021-07-10 NOTE — Assessment & Plan Note (Signed)
Elevated BP: BP today is very good, normal ambulatory BPs Hyperlipidemia: Since the last visit, lost several pounds, doing better with diet and exercise, check FLP.  He is aware that even if the cholesterol has improved he could be still a candidate for statins. Neuropathy: Lower extremity paresthesias as described above, previous B12 TSH normal, plan: Folic acid, homocystine, RPR.  Feet care discussed. If sxs increase consider further eval-w/u.   Preventive care:flu shot and COVID booster discussed RTC 09/2021 CPX

## 2021-07-10 NOTE — Progress Notes (Signed)
Subjective:    Patient ID: Kyle Strickland, male    DOB: [birth year], 75 y.o.   MRN: [id number]  DOS:  07/10/2021 Type of visit - description: ROV Since the last office visit he is doing well.  + Weight loss. More active, eating healthier.  Did c/o the following: When he sits for an hour or 2 with driving, he developed some achiness at the back and proximal lower extremities. He still has some numbness at the tip of the toes, worse early in the morning, better as the day goes by. Does not have any consistent low back pain.  Wt Readings from Last 3 Encounters:  07/10/21 212 lb (96.2 kg)  11/08/20 221 lb (100.2 kg)  10/23/20 221 lb 12.8 oz (100.6 kg)     Review of Systems See above   Past Medical History:  Diagnosis Date   Elevated blood-pressure reading without diagnosis of hypertension    Other and unspecified hyperlipidemia    Prostate cancer ([place]) 2011   Vitamin D deficiency     Past Surgical History:  Procedure Laterality Date   PROSTATECTOMY  2011   Dr Darcus Austin, [place] as of 07/10/2021       Reactions   Ciprofloxacin    Hives post op   Ditropan [oxybutynin Chloride]    Hives 2011, post op   Codeine    Nausea and headaches         Medication List        Accurate as of July 10, 2021  7:54 PM. If you have any questions, ask your nurse or doctor.          STOP taking these medications    Paxlovid 20 x 150 MG & 10 x '100MG'$  Tbpk Generic drug: nirmatrelvir & ritonavir Stopped by: Kathlene November, MD       TAKE these medications    aspirin EC 81 MG tablet Take 81 mg by mouth daily. Swallow whole. 5 nights a week.   cyanocobalamin 100 MCG tablet Take 100 mcg by mouth daily.   fish oil-omega-3 fatty acids 1000 MG capsule Take 1 g by mouth daily.   Magnesium 400 MG Caps Take 1 tablet by mouth daily.   MULTIVITAMINS PO Take by mouth daily.   Vitamin D3 25 MCG (1000 UT) Caps Take by mouth daily.           Objective:   Physical  Exam BP 124/72 (BP Location: Left Arm, Patient Position: Sitting, Cuff Size: Normal)   Pulse 61   Temp 97.6 F (36.4 C)   Resp 16   Ht '5\' 11"'$  (1.803 m)   Wt 212 lb (96.2 kg)   SpO2 97%   BMI 29.57 kg/m  General:   Well developed, NAD, BMI noted. HEENT:  Normocephalic . Face symmetric, atraumatic Lungs:  CTA B Normal respiratory effort, no intercostal retractions, no accessory muscle use. Heart: RRR,  no murmur.  Lower extremities: no pretibial edema bilaterally.  Good pedal pulses. Pinprick examination: Minimally decreased at the distal area of the toes. Skin: Not pale. Not jaundice Neurologic:  alert & oriented X3.  Speech normal, gait appropriate for age and unassisted Psych--  Cognition and judgment appear intact.  Cooperative with normal attention span and concentration.  Behavior appropriate. No anxious or depressed appearing.      Assessment    ASSESSMENT Elevated BP without HTN Hyperlipidemia H/o  prostate cancer: surgery at [facility] last urology visit ~ 2015 Obesity  Vitamin D  deficiency Covid infex 10-2020  PLAN Elevated BP: BP today is very good, normal ambulatory BPs Hyperlipidemia: Since the last visit, lost several pounds, doing better with diet and exercise, check FLP.  He is aware that even if the cholesterol has improved he could be still a candidate for statins. Neuropathy: Lower extremity paresthesias as described above, previous B12 TSH normal, plan: Folic acid, homocystine, RPR.  Feet care discussed. If sxs increase consider further eval-w/u.   Preventive care:flu shot and COVID booster discussed RTC 09/2021 CPX  This visit occurred during the SARS-CoV-2 public health emergency.  Safety protocols were in place, including screening questions prior to the visit, additional usage of staff PPE, and extensive cleaning of exam room while observing appropriate contact time as indicated for disinfecting solutions.

## 2021-07-11 ENCOUNTER — Other Ambulatory Visit (INDEPENDENT_AMBULATORY_CARE_PROVIDER_SITE_OTHER): Payer: Medicare Other

## 2021-07-11 DIAGNOSIS — Z8546 Personal history of malignant neoplasm of prostate: Secondary | ICD-10-CM | POA: Diagnosis not present

## 2021-07-11 LAB — FECAL OCCULT BLOOD, IMMUNOCHEMICAL: Fecal Occult Bld: NEGATIVE

## 2021-07-11 LAB — RPR: RPR Ser Ql: NONREACTIVE

## 2021-07-11 LAB — HOMOCYSTEINE: Homocysteine: 11.6 umol/L — ABNORMAL HIGH (ref <11.4)

## 2021-08-23 ENCOUNTER — Emergency Department (HOSPITAL_BASED_OUTPATIENT_CLINIC_OR_DEPARTMENT_OTHER): Payer: Medicare Other

## 2021-08-23 ENCOUNTER — Encounter (HOSPITAL_COMMUNITY)
Admission: EM | Disposition: A | Discharge status: Home / Self Care | Payer: Self-pay | Source: Home / Self Care | Attending: Thoracic Surgery (Cardiothoracic Vascular Surgery)

## 2021-08-23 ENCOUNTER — Other Ambulatory Visit: Payer: Self-pay

## 2021-08-23 ENCOUNTER — Telehealth: Payer: Self-pay | Admitting: Internal Medicine

## 2021-08-23 ENCOUNTER — Inpatient Hospital Stay (HOSPITAL_BASED_OUTPATIENT_CLINIC_OR_DEPARTMENT_OTHER)
Admission: EM | Admit: 2021-08-23 | Discharge: 2021-09-04 | DRG: 234 | Disposition: A | Discharge status: Home / Self Care | Payer: Medicare Other | Attending: Thoracic Surgery (Cardiothoracic Vascular Surgery) | Admitting: Thoracic Surgery (Cardiothoracic Vascular Surgery)

## 2021-08-23 ENCOUNTER — Encounter (HOSPITAL_BASED_OUTPATIENT_CLINIC_OR_DEPARTMENT_OTHER): Payer: Self-pay | Admitting: Emergency Medicine

## 2021-08-23 DIAGNOSIS — Z951 Presence of aortocoronary bypass graft: Secondary | ICD-10-CM

## 2021-08-23 DIAGNOSIS — Z833 Family history of diabetes mellitus: Secondary | ICD-10-CM | POA: Diagnosis not present

## 2021-08-23 DIAGNOSIS — I2583 Coronary atherosclerosis due to lipid rich plaque: Secondary | ICD-10-CM | POA: Diagnosis not present

## 2021-08-23 DIAGNOSIS — I2510 Atherosclerotic heart disease of native coronary artery without angina pectoris: Secondary | ICD-10-CM

## 2021-08-23 DIAGNOSIS — Z01810 Encounter for preprocedural cardiovascular examination: Secondary | ICD-10-CM | POA: Diagnosis not present

## 2021-08-23 DIAGNOSIS — I4510 Unspecified right bundle-branch block: Secondary | ICD-10-CM | POA: Diagnosis not present

## 2021-08-23 DIAGNOSIS — I214 Non-ST elevation (NSTEMI) myocardial infarction: Secondary | ICD-10-CM | POA: Diagnosis not present

## 2021-08-23 DIAGNOSIS — Z823 Family history of stroke: Secondary | ICD-10-CM

## 2021-08-23 DIAGNOSIS — D62 Acute posthemorrhagic anemia: Secondary | ICD-10-CM | POA: Diagnosis not present

## 2021-08-23 DIAGNOSIS — E7800 Pure hypercholesterolemia, unspecified: Secondary | ICD-10-CM | POA: Diagnosis not present

## 2021-08-23 DIAGNOSIS — I25110 Atherosclerotic heart disease of native coronary artery with unstable angina pectoris: Secondary | ICD-10-CM | POA: Diagnosis present

## 2021-08-23 DIAGNOSIS — Z20822 Contact with and (suspected) exposure to covid-19: Secondary | ICD-10-CM | POA: Diagnosis not present

## 2021-08-23 DIAGNOSIS — E785 Hyperlipidemia, unspecified: Secondary | ICD-10-CM | POA: Diagnosis not present

## 2021-08-23 DIAGNOSIS — I9789 Other postprocedural complications and disorders of the circulatory system, not elsewhere classified: Secondary | ICD-10-CM | POA: Diagnosis not present

## 2021-08-23 DIAGNOSIS — D72829 Elevated white blood cell count, unspecified: Secondary | ICD-10-CM | POA: Diagnosis not present

## 2021-08-23 DIAGNOSIS — D696 Thrombocytopenia, unspecified: Secondary | ICD-10-CM | POA: Diagnosis present

## 2021-08-23 DIAGNOSIS — Z8546 Personal history of malignant neoplasm of prostate: Secondary | ICD-10-CM | POA: Diagnosis not present

## 2021-08-23 DIAGNOSIS — E782 Mixed hyperlipidemia: Secondary | ICD-10-CM | POA: Diagnosis not present

## 2021-08-23 DIAGNOSIS — Z8052 Family history of malignant neoplasm of bladder: Secondary | ICD-10-CM

## 2021-08-23 DIAGNOSIS — Z8249 Family history of ischemic heart disease and other diseases of the circulatory system: Secondary | ICD-10-CM | POA: Diagnosis not present

## 2021-08-23 DIAGNOSIS — R079 Chest pain, unspecified: Secondary | ICD-10-CM | POA: Diagnosis not present

## 2021-08-23 DIAGNOSIS — Z9079 Acquired absence of other genital organ(s): Secondary | ICD-10-CM

## 2021-08-23 DIAGNOSIS — I4891 Unspecified atrial fibrillation: Secondary | ICD-10-CM | POA: Diagnosis not present

## 2021-08-23 DIAGNOSIS — Z7982 Long term (current) use of aspirin: Secondary | ICD-10-CM

## 2021-08-23 DIAGNOSIS — I10 Essential (primary) hypertension: Secondary | ICD-10-CM | POA: Diagnosis not present

## 2021-08-23 DIAGNOSIS — I351 Nonrheumatic aortic (valve) insufficiency: Secondary | ICD-10-CM | POA: Diagnosis not present

## 2021-08-23 DIAGNOSIS — E8770 Fluid overload, unspecified: Secondary | ICD-10-CM | POA: Diagnosis present

## 2021-08-23 DIAGNOSIS — J9811 Atelectasis: Secondary | ICD-10-CM | POA: Diagnosis not present

## 2021-08-23 DIAGNOSIS — Z803 Family history of malignant neoplasm of breast: Secondary | ICD-10-CM

## 2021-08-23 DIAGNOSIS — J90 Pleural effusion, not elsewhere classified: Secondary | ICD-10-CM | POA: Diagnosis not present

## 2021-08-23 HISTORY — PX: LEFT HEART CATH AND CORONARY ANGIOGRAPHY: CATH118249

## 2021-08-23 LAB — BASIC METABOLIC PANEL
Anion gap: 9 (ref 5–15)
BUN: 16 mg/dL (ref 8–23)
CO2: 25 mmol/L (ref 22–32)
Calcium: 10.3 mg/dL (ref 8.9–10.3)
Chloride: 103 mmol/L (ref 98–111)
Creatinine, Ser: 0.95 mg/dL (ref 0.61–1.24)
GFR, Estimated: >60 mL/min (ref >60)
Glucose, Bld: 143 mg/dL — ABNORMAL HIGH (ref 70–99)
Potassium: 4.4 mmol/L (ref 3.5–5.1)
Sodium: 137 mmol/L (ref 135–145)

## 2021-08-23 LAB — CBC
HCT: 42.3 % (ref 39.0–52.0)
Hemoglobin: 14.6 g/dL (ref 13.0–17.0)
MCH: 32.2 pg (ref 26.0–34.0)
MCHC: 34.5 g/dL (ref 30.0–36.0)
MCV: 93.4 fL (ref 80.0–100.0)
Platelets: 202 10*3/uL (ref 150–400)
RBC: 4.53 MIL/uL (ref 4.22–5.81)
RDW: 12.1 % (ref 11.5–15.5)
WBC: 8.7 10*3/uL (ref 4.0–10.5)
nRBC: 0 % (ref 0.0–0.2)

## 2021-08-23 LAB — SARS CORONAVIRUS 2 BY RT PCR (HOSPITAL ORDER, PERFORMED IN ~~LOC~~ HOSPITAL LAB): SARS Coronavirus 2: NEGATIVE

## 2021-08-23 LAB — TROPONIN I (HIGH SENSITIVITY)
Troponin I (High Sensitivity): 381 ng/L (ref <18)
Troponin I (High Sensitivity): 403 ng/L (ref <18)

## 2021-08-23 SURGERY — LEFT HEART CATH AND CORONARY ANGIOGRAPHY
Anesthesia: LOCAL

## 2021-08-23 MED ORDER — HEPARIN SODIUM (PORCINE) 1000 UNIT/ML IJ SOLN
INTRAMUSCULAR | Status: DC | PRN
  Administered 2021-08-23: 5000 [IU] via INTRAVENOUS

## 2021-08-23 MED ORDER — MIDAZOLAM HCL 2 MG/2ML IJ SOLN
INTRAMUSCULAR | Status: AC
  Filled 2021-08-23: qty 2

## 2021-08-23 MED ORDER — FENTANYL CITRATE (PF) 100 MCG/2ML IJ SOLN
INTRAMUSCULAR | Status: DC | PRN
  Administered 2021-08-23: 25 ug via INTRAVENOUS

## 2021-08-23 MED ORDER — ASPIRIN EC 81 MG PO TBEC
81.0000 mg | DELAYED_RELEASE_TABLET | Freq: Every day | ORAL | Status: DC
  Administered 2021-08-24 – 2021-08-28 (×5): 81 mg via ORAL
  Filled 2021-08-23 (×5): qty 1

## 2021-08-23 MED ORDER — SODIUM CHLORIDE 0.9 % WEIGHT BASED INFUSION: 1.0000 mL/kg/h | INTRAVENOUS | Status: DC

## 2021-08-23 MED ORDER — VERAPAMIL HCL 2.5 MG/ML IV SOLN
INTRAVENOUS | Status: AC
  Filled 2021-08-23: qty 2

## 2021-08-23 MED ORDER — ATORVASTATIN CALCIUM 80 MG PO TABS
80.0000 mg | ORAL_TABLET | Freq: Every day | ORAL | Status: DC
  Administered 2021-08-23 – 2021-09-04 (×12): 80 mg via ORAL
  Filled 2021-08-23 (×12): qty 1

## 2021-08-23 MED ORDER — VERAPAMIL HCL 2.5 MG/ML IV SOLN
INTRAVENOUS | Status: DC | PRN
  Administered 2021-08-23: 5 mL via INTRA_ARTERIAL
  Administered 2021-08-23: 10 mL via INTRA_ARTERIAL

## 2021-08-23 MED ORDER — HYDRALAZINE HCL 20 MG/ML IJ SOLN: 10.0000 mg | INTRAMUSCULAR | Status: AC | PRN

## 2021-08-23 MED ORDER — NITROGLYCERIN 0.4 MG SL SUBL: 0.4000 mg | SUBLINGUAL_TABLET | SUBLINGUAL | Status: DC | PRN

## 2021-08-23 MED ORDER — HEPARIN BOLUS VIA INFUSION
4000.0000 [IU] | Freq: Once | INTRAVENOUS | Status: AC
  Administered 2021-08-23: 4000 [IU] via INTRAVENOUS

## 2021-08-23 MED ORDER — ROSUVASTATIN CALCIUM 20 MG PO TABS: 40.0000 mg | ORAL_TABLET | Freq: Every day | ORAL | Status: DC

## 2021-08-23 MED ORDER — ACETAMINOPHEN 325 MG PO TABS: 650.0000 mg | ORAL_TABLET | ORAL | Status: DC | PRN

## 2021-08-23 MED ORDER — ASPIRIN EC 325 MG PO TBEC
325.0000 mg | DELAYED_RELEASE_TABLET | Freq: Once | ORAL | Status: AC
  Administered 2021-08-23: 325 mg via ORAL
  Filled 2021-08-23: qty 1

## 2021-08-23 MED ORDER — SODIUM CHLORIDE 0.9% FLUSH
3.0000 mL | Freq: Two times a day (BID) | INTRAVENOUS | Status: DC
  Administered 2021-08-23 – 2021-08-28 (×7): 3 mL via INTRAVENOUS

## 2021-08-23 MED ORDER — ASPIRIN 300 MG RE SUPP: 300.0000 mg | RECTAL | Status: DC

## 2021-08-23 MED ORDER — HEPARIN (PORCINE) 25000 UT/250ML-% IV SOLN
1600.0000 [IU]/h | INTRAVENOUS | Status: DC
  Administered 2021-08-24: 1500 [IU]/h via INTRAVENOUS
  Administered 2021-08-24: 1200 [IU]/h via INTRAVENOUS
  Administered 2021-08-25 – 2021-08-26 (×3): 1500 [IU]/h via INTRAVENOUS
  Administered 2021-08-27 – 2021-08-28 (×3): 1600 [IU]/h via INTRAVENOUS
  Filled 2021-08-23 (×7): qty 250

## 2021-08-23 MED ORDER — HEPARIN (PORCINE) 25000 UT/250ML-% IV SOLN
1200.0000 [IU]/h | INTRAVENOUS | Status: DC
  Administered 2021-08-23: 1200 [IU]/h via INTRAVENOUS
  Filled 2021-08-23: qty 250

## 2021-08-23 MED ORDER — LIDOCAINE HCL (PF) 1 % IJ SOLN
INTRAMUSCULAR | Status: AC
  Filled 2021-08-23: qty 30

## 2021-08-23 MED ORDER — ASPIRIN EC 81 MG PO TBEC: 81.0000 mg | DELAYED_RELEASE_TABLET | Freq: Every day | ORAL | Status: DC

## 2021-08-23 MED ORDER — SODIUM CHLORIDE 0.9% FLUSH: 3.0000 mL | INTRAVENOUS | Status: DC | PRN

## 2021-08-23 MED ORDER — ONDANSETRON HCL 4 MG/2ML IJ SOLN: 4.0000 mg | Freq: Four times a day (QID) | INTRAMUSCULAR | Status: DC | PRN

## 2021-08-23 MED ORDER — SODIUM CHLORIDE 0.9 % IV SOLN: INTRAVENOUS | Status: AC

## 2021-08-23 MED ORDER — ASPIRIN 81 MG PO CHEW: 324.0000 mg | CHEWABLE_TABLET | ORAL | Status: DC

## 2021-08-23 MED ORDER — FENTANYL CITRATE (PF) 100 MCG/2ML IJ SOLN
INTRAMUSCULAR | Status: AC
  Filled 2021-08-23: qty 2

## 2021-08-23 MED ORDER — METOPROLOL TARTRATE 12.5 MG HALF TABLET
12.5000 mg | ORAL_TABLET | Freq: Two times a day (BID) | ORAL | Status: DC
  Administered 2021-08-23: 12.5 mg via ORAL
  Filled 2021-08-23: qty 1

## 2021-08-23 MED ORDER — ASPIRIN 81 MG PO CHEW: 81.0000 mg | CHEWABLE_TABLET | ORAL | Status: DC

## 2021-08-23 MED ORDER — LABETALOL HCL 5 MG/ML IV SOLN: 10.0000 mg | INTRAVENOUS | Status: AC | PRN

## 2021-08-23 MED ORDER — MIDAZOLAM HCL 2 MG/2ML IJ SOLN
INTRAMUSCULAR | Status: DC | PRN
  Administered 2021-08-23: 2 mg via INTRAVENOUS

## 2021-08-23 MED ORDER — SODIUM CHLORIDE 0.9 % IV SOLN: 250.0000 mL | INTRAVENOUS | Status: DC | PRN

## 2021-08-23 MED ORDER — HEPARIN (PORCINE) IN NACL 1000-0.9 UT/500ML-% IV SOLN
INTRAVENOUS | Status: AC
  Filled 2021-08-23: qty 1000

## 2021-08-23 MED ORDER — SODIUM CHLORIDE 0.9% FLUSH
3.0000 mL | Freq: Two times a day (BID) | INTRAVENOUS | Status: DC
  Administered 2021-08-23 – 2021-08-28 (×6): 3 mL via INTRAVENOUS

## 2021-08-23 MED ORDER — HEPARIN SODIUM (PORCINE) 1000 UNIT/ML IJ SOLN
INTRAMUSCULAR | Status: AC
  Filled 2021-08-23: qty 1

## 2021-08-23 MED ORDER — METOPROLOL TARTRATE 12.5 MG HALF TABLET: 12.5000 mg | ORAL_TABLET | Freq: Two times a day (BID) | ORAL | Status: DC

## 2021-08-23 MED ORDER — IOHEXOL 350 MG/ML SOLN
INTRAVENOUS | Status: DC | PRN
  Administered 2021-08-23: 60 mL

## 2021-08-23 MED ORDER — SODIUM CHLORIDE 0.9% FLUSH
3.0000 mL | INTRAVENOUS | Status: DC | PRN
  Administered 2021-08-24: 3 mL via INTRAVENOUS

## 2021-08-23 MED ORDER — HEPARIN (PORCINE) IN NACL 1000-0.9 UT/500ML-% IV SOLN
INTRAVENOUS | Status: DC | PRN
  Administered 2021-08-23 (×2): 500 mL

## 2021-08-23 MED ORDER — LIDOCAINE HCL (PF) 1 % IJ SOLN
INTRAMUSCULAR | Status: DC | PRN
  Administered 2021-08-23: 2 mL

## 2021-08-23 MED ORDER — SODIUM CHLORIDE 0.9 % WEIGHT BASED INFUSION
3.0000 mL/kg/h | INTRAVENOUS | Status: DC
  Administered 2021-08-23: 3 mL/kg/h via INTRAVENOUS

## 2021-08-23 SURGICAL SUPPLY — 9 items

## 2021-08-23 NOTE — H&P (Addendum)
Cardiology Admission History and Physical:   Patient ID: Kyle Strickland MRN: [id number]; DOB: 1946-07-02   Admission date: 08/23/2021  PCP:  Colon Branch, MD   [place] Providers Cardiologist:  None     Chief Complaint:  Chest pain   Patient Profile:   Kyle Strickland is a 75 y.o. male with no history of CAD or tobacco use who is being seen 08/23/2021 for the evaluation of chest pain to med [facility].  History of Present Illness:   Kyle Strickland is a 75 year old male with no reported past medical history.  States he does not take any prescription medications but follows with PCP on a regular basis.  He has been in his usual state of health up until the past week.  He first developed episodes of left arm pain with radiation into the left side of his chest with exertional activity such as walking his dog.  These episodes typically resolved within a few minutes after resting.  Developed recurrent episode last evening and then again this morning.  This morning's episode seemed to linger longer, with associated shortness of breath.  He also reports feeling weak afterwards.  He called his PCP office but ultimately presented to [place] for further evaluation.  In the ED his labs showed sodium 137, potassium 4.4, creatinine 0.95, high-sensitivity troponin 381>>403, WBC 8.7, hemoglobin 14.6.  EKG showed sinus rhythm, 69 bpm, right bundle branch block.  He was given 325 of aspirin and started on IV heparin.  Case discussed with cardiology and transferred to [facility] for further evaluation with cardiac catheterization.  He is pain-free at the time of arrival.   Past Medical History:  Diagnosis Date   Elevated blood-pressure reading without diagnosis of hypertension    Other and unspecified hyperlipidemia    Prostate cancer ([place]) 2011   Vitamin D deficiency     Past Surgical History:  Procedure Laterality Date   PROSTATECTOMY  2011   Dr Darcus Austin, [facility]     Medications  Prior to Admission: Prior to Admission medications   Medication Sig Start Date End Date Taking? Authorizing Provider  aspirin EC 81 MG tablet Take 81 mg by mouth daily. Swallow whole. 5 nights a week.    [provider]  Cholecalciferol (VITAMIN D3) 1000 UNITS CAPS Take by mouth daily.    [provider]  cyanocobalamin 100 MCG tablet Take 100 mcg by mouth daily.    [provider]  fish oil-omega-3 fatty acids 1000 MG capsule Take 1 g by mouth daily.    [provider]  Magnesium 400 MG CAPS Take 1 tablet by mouth daily.    [provider]  Multiple Vitamin (MULTIVITAMINS PO) Take by mouth daily.    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   Tobacco Use   Smoking status: Never   Smokeless tobacco: Never  Substance and Sexual Activity  Alcohol use: Not Currently    Comment: Rarely   Drug use: No   Sexual activity: Not on file  Other Topics Concern   Not on file  Social History Narrative   Fun: Biomedical scientist, golf    Social Determinants of Health   Financial Resource Strain: Not on file  Food Insecurity: Not on file  Transportation Needs: Not on file  Physical Activity: Not on file  Stress: Not on file  Social Connections: Not on file  Intimate Partner Violence: Not on file    Family History:   The patient's family history includes Cancer in his maternal grandfather; Dementia in his father; Diabetes in his father and paternal grandfather; Heart attack (age of onset: 71) in his mother; Stroke in his  paternal grandfather; Transient ischemic attack in his paternal uncle. There is no history of Colon cancer.    ROS:  Please see the history of present illness.  All other ROS reviewed and negative.     Physical Exam/Data:   Vitals:   08/23/21 1537 08/23/21 1544 08/23/21 1549 08/23/21 1552  BP: (!) 180/100   (!) 164/83  Pulse: 73  74 73  Resp: 14 20 14 12   Temp:      TempSrc:      SpO2: 100%  100% 99%  Weight:      Height:       No intake or output data in the 24 hours ending 08/23/21 1559 Last 3 Weights 08/23/2021 08/23/2021 07/10/2021  Weight (lbs) 212 lb 205 lb 212 lb  Weight (kg) 96.163 kg 92.987 kg 96.163 kg     Body mass index is 29.57 kg/m.  General:  Well nourished, well developed, in no acute distress HEENT: normal Neck: no JVD Vascular: No carotid bruits; Distal pulses 2+ bilaterally   Cardiac:  normal S1, S2; RRR; no murmur  Lungs:  clear to auscultation bilaterally, no wheezing, rhonchi or rales  Abd: soft, nontender, no hepatomegaly  Ext: no edema Musculoskeletal:  No deformities, BUE and BLE strength normal and equal Skin: warm and dry  Neuro:  CNs 2-12 intact, no focal abnormalities noted Psych:  Normal affect    EKG:  The ECG that was done 10/27 was personally reviewed and demonstrates sinus rhythm, 69 bpm, right bundle branch block  Relevant CV Studies:  N/A  Laboratory Data:  High Sensitivity Troponin:   Recent Labs  Lab 08/23/21 0949 08/23/21 1200  TROPONINIHS 381* 403*      Chemistry Recent Labs  Lab 08/23/21 0949  NA 137  K 4.4  CL 103  CO2 25  GLUCOSE 143*  BUN 16  CREATININE 0.95  CALCIUM 10.3  GFRNONAA >60  ANIONGAP 9    No results for input(s): PROT, ALBUMIN, AST, ALT, ALKPHOS, BILITOT in the last 168 hours. Lipids No results for input(s): CHOL, TRIG, HDL, LABVLDL, LDLCALC, CHOLHDL in the last 168 hours. Hematology Recent Labs  Lab 08/23/21 0949  WBC 8.7  RBC 4.53  HGB 14.6  HCT 42.3  MCV 93.4  MCH 32.2  MCHC  34.5  RDW 12.1  PLT 202   Thyroid No results for input(s): TSH, FREET4 in the last 168 hours. BNPNo results for input(s): BNP, PROBNP in the last 168 hours.  DDimer No results for input(s): DDIMER in the last 168 hours.   Radiology/Studies:  DG Chest 2 View  Result Date: 08/23/2021 CLINICAL DATA:  Chest pain. EXAM: CHEST - 2 VIEW COMPARISON:  No pertinent prior exams available for comparison. FINDINGS: Heart  size within normal limits. Aortic atherosclerosis. Minimal atelectasis versus scarring within the lateral left lung base. No appreciable airspace consolidation or pulmonary edema. No evidence of pleural effusion or pneumothorax. No acute bony abnormality identified. IMPRESSION: Minimal atelectasis versus scarring within the lateral left lung base. Otherwise, no evidence of acute cardiopulmonary abnormality. Aortic Atherosclerosis (ICD10-I70.0). Electronically Signed   By: Kellie Simmering D.O.   On: 08/23/2021 10:07    Assessment and Plan:   Kyle Strickland is a 75 y.o. male with no history of CAD or tobacco use who is being seen 08/23/2021 for the evaluation of chest pain to med [facility].  NSTEMI: High-sensitivity troponin 381>> 403. EKG with known right bundle branch block, biphasic T waves in septal leads.  He was started on IV heparin.  Transferred to [facility] for further evaluation with cardiac catheterization.  He is pain-free on arrival. -- Start ASA, statin, metoprolol -- continue IV heparin  -- Echo  Shared Decision Making/Informed Consent The risks [stroke (1 in 1000), death (1 in 1000), kidney failure [usually temporary] (1 in 500), bleeding (1 in 200), allergic reaction [possibly serious] (1 in 200)], benefits (diagnostic support and management of coronary artery disease) and alternatives of a cardiac catheterization were discussed in detail with Mr. Mauriello and he is willing to proceed.  Risk Assessment/Risk Scores:   TIMI Risk Score for Unstable Angina or Non-ST  Elevation MI:   The patient's TIMI risk score is 4, which indicates a 20% risk of all cause mortality, new or recurrent myocardial infarction or need for urgent revascularization in the next 14 days.{  Severity of Illness: The appropriate patient status for this patient is INPATIENT. Inpatient status is judged to be reasonable and necessary in order to provide the required intensity of service to ensure the patient's safety. The patient's presenting symptoms, physical exam findings, and initial radiographic and laboratory data in the context of their chronic comorbidities is felt to place them at high risk for further clinical deterioration. Furthermore, it is not anticipated that the patient will be medically stable for discharge from the hospital within 2 midnights of admission.   * I certify that at the point of admission it is my clinical judgment that the patient will require inpatient hospital care spanning beyond 2 midnights from the point of admission due to high intensity of service, high risk for further deterioration and high frequency of surveillance required.*   For questions or updates, please contact [place] Please consult [email or web address] for contact info under     Signed, Reino Bellis, NP  08/23/2021 3:59 PM   I have examined the patient and reviewed assessment and plan and discussed with patient.  Agree with above as stated.  Sx concerning for ACS. Plan for cath.  All questions answered.  He is not taking any prescription meds at this time but is willing to take what is needed if he gets a stent.  No chest discomfort currently.   Cath Lab Visit (complete for each Cath Lab visit)  Clinical Evaluation Leading to the Procedure:   ACS: Yes.    Non-ACS:    Anginal Classification: CCS IV  Anti-ischemic medical therapy: Minimal Therapy (1 class of medications)  Non-Invasive Test Results: No non-invasive testing performed  Prior CABG: No previous  CABG      Larae Grooms

## 2021-08-23 NOTE — Progress Notes (Signed)
ANTICOAGULATION CONSULT NOTE - Initial Consult  Pharmacy Consult for heparin Indication: chest pain/ACS  Allergies  Allergen Reactions   Ciprofloxacin     Hives post op   Ditropan [Oxybutynin Chloride]     Hives 2011, post op   Codeine     Nausea and headaches     Patient Measurements: Height: 5\' 11"  (180.3 cm) Weight: 96.2 kg (212 lb) IBW/kg (Calculated) : 75.3 Heparin Dosing Weight: 93kg  Vital Signs: Temp: 98.8 F (37.1 C) (10/27 0945) Temp Source: Oral (10/27 0945) BP: 135/80 (10/27 1138) Pulse Rate: 64 (10/27 1138)  Labs: Recent Labs    08/23/21 0949  HGB 14.6  HCT 42.3  PLT 202  CREATININE 0.95  TROPONINIHS 381*    Estimated Creatinine Clearance: 79.5 mL/min (by C-G formula based on SCr of 0.95 mg/dL).   Medical History: Past Medical History:  Diagnosis Date   Elevated blood-pressure reading without diagnosis of hypertension    Other and unspecified hyperlipidemia    Prostate cancer ([place]) 2011   Vitamin D deficiency      Assessment: 5 YOM presenting with CP, elevated troponin, he is not on anticoagulation PTA, CBC wnl  Goal of Therapy:  Heparin level 0.3-0.7 units/ml Monitor platelets by anticoagulation protocol: Yes   Plan:  Heparin 4000 units IV x 1, and gtt at 1200 units/hr F/u 8 hour heparin level F/u cards eval and recs  Bertis Ruddy, PharmD Clinical Pharmacist ED Pharmacist Phone # [phone] 08/23/2021 12:23 PM

## 2021-08-23 NOTE — Telephone Encounter (Signed)
Nurse Assessment Nurse: Fredderick Phenix, RN, Lelan Pons Date/Time Eilene Ghazi Time): 08/23/2021 8:51:55 AM Confirm and document reason for call. If symptomatic, describe symptoms. ---Caller is feeling weak and slightly lightheaded. He has a lot he has pressure in his left arm like he has a BP cuff is around his arm. It's happening intermittently. Some chest tightness on the left. Does the patient have any new or worsening symptoms? ---Yes Will a triage be completed? ---Yes Related visit to physician within the last 2 weeks? ---No Does the PT have any chronic conditions? (i.e. diabetes, asthma, this includes High risk factors for pregnancy, etc.) ---No Is this a behavioral health or substance abuse call? ---No Guidelines Guideline Title Affirmed Question Affirmed Notes Nurse Date/Time Eilene Ghazi Time) Chest Pain [1] Chest pain lasts > 5 minutes AND [2] described as crushing, pressure-like, or heavy Stormy Card 08/23/2021 8:54:45 AM Disp. Time Eilene Ghazi Time) Disposition Final User 08/23/2021 8:47:43 AM Send to Urgent Corliss Skains 08/23/2021 9:01:14 AM 911 Outcome Documentation Fredderick Phenix, RN, Lelan Pons PLEASE NOTE: All timestamps contained within this report are represented as [place] Standard Time. CONFIDENTIALTY NOTICE: This fax transmission is intended only for the addressee. It contains information that is legally privileged, confidential or otherwise protected from use or disclosure. If you are not the intended recipient, you are strictly prohibited from reviewing, disclosing, copying using or disseminating any of this information or taking any action in reliance on or regarding this information. If you have received this fax in error, please notify us immediately by telephone so that we can arrange for its return to [place]. Phone: [phone], Toll-Free: [phone], Fax: [phone] Page: 2 of 2 Call Id: [id number] [place]. Time Eilene Ghazi Time) Disposition Final User Reason: Declines EMS  transport, wife will drive him. 08/23/2021 8:59:06 AM Call EMS 911 Now Yes Fredderick Phenix, RN, [place] Disagree/Comply Comply Caller Understands Yes PreDisposition Call Doctor Care Advice Given Per Guideline CALL EMS 911 NOW: * Immediate medical attention is needed. You need to hang up and call 911 (or an ambulance). CARE ADVICE given per Chest Pain (Adult) guideline. Referrals [facility] - ED  Pt in ED.

## 2021-08-23 NOTE — Telephone Encounter (Signed)
Currently in the hospital having a cardiac catheterization

## 2021-08-23 NOTE — Telephone Encounter (Signed)
Pt called with discomfort in his arm and chest. Transferred pt to Triage for advise to either seek medical attention or scheduled the next opening with his pcp.

## 2021-08-23 NOTE — Progress Notes (Signed)
ANTICOAGULATION CONSULT NOTE   Pharmacy Consult for heparin Indication: chest pain/ACS  Allergies  Allergen Reactions   Ciprofloxacin     Hives post op   Ditropan [Oxybutynin Chloride]     Hives 2011, post op   Codeine     Nausea and headaches     Patient Measurements: Height: 5\' 11"  (180.3 cm) Weight: 96.2 kg (212 lb) IBW/kg (Calculated) : 75.3 Heparin Dosing Weight: 93kg  Vital Signs: Temp: 99.1 F (37.3 C) (10/27 1406) Temp Source: Oral (10/27 1406) BP: 136/80 (10/27 1716) Pulse Rate: 76 (10/27 1716)  Labs: Recent Labs    08/23/21 0949 08/23/21 1200  HGB 14.6  --   HCT 42.3  --   PLT 202  --   CREATININE 0.95  --   TROPONINIHS 381* 403*     Estimated Creatinine Clearance: 79.5 mL/min (by C-G formula based on SCr of 0.95 mg/dL).   Medical History: Past Medical History:  Diagnosis Date   Elevated blood-pressure reading without diagnosis of hypertension    Other and unspecified hyperlipidemia    Prostate cancer ([place]) 2011   Vitamin D deficiency      Assessment: 80 YOM presenting with CP, elevated troponin, he is not on anticoagulation PTA, CBC wnl.  Pharmacy consulted for heparin infusion.  Heparin bolus and infusion started ~ 12:30 today 10/27.  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  TCTS consulted. Started high dose statin,and low dose BB. Pharmacy  to resume heparin 8 h post sheath pull. Removed at 17:20, TR band used, no bleeding, no hematoma.   Goal of Therapy:  Heparin level 0.3-0.7 units/ml Monitor platelets by anticoagulation protocol: Yes   Plan:  At 01:30 on 10/28 resume Heparin 1200 units/hr  Check ~8 hr HL Daily HL, CBC.   Thank you for allowing pharmacy to be part of this patients care team. Nicole Cella, RPh Clinical Pharmacist Please check AMION for all [place] phone numbers After 10:00 PM, call [place]  08/23/2021 6:25 PM

## 2021-08-23 NOTE — ED Notes (Signed)
ED Provider at bedside. 

## 2021-08-23 NOTE — ED Notes (Signed)
Pt dressed in gown, placed on monitor.

## 2021-08-23 NOTE — ED Triage Notes (Signed)
Pt POV reports chest pressure starting last week, 5 intermittent episodes of pressure "feels like a blood pressur cuff squeezing my left arm" and left upper chest, relieved by rest.

## 2021-08-23 NOTE — ED Provider Notes (Signed)
[facility] Provider Note   CSN: [id number] Arrival date & time: 08/23/21  5035     History Chief Complaint  Patient presents with   Chest Pain    ZARIN KNUPP is a 75 y.o. male with PMH HTN, prostate cancer who presents the emergency department for evaluation of left arm pain and numbness.  Patient states that over the last week and a half he has had at least 5 episodes lasting about 45 seconds of left arm pain and numbness that feels "like a blood pressure cuff is squeezing on my arm".  Arm pain and pressure radiates into the chest.  These episodes are usually associated with exertion but not associated with nausea, vomiting, diaphoresis.  He states that he occasionally has shortness of breath with these episodes.  He spoke with his family medicine provider who encouraged him to come to the emergency department for further evaluation.    Chest Pain Associated symptoms: no abdominal pain, no cough, no dizziness, no fatigue, no fever, no headache, no nausea, no numbness, no shortness of breath, no vomiting and no weakness       Past Medical History:  Diagnosis Date   Elevated blood-pressure reading without diagnosis of hypertension    Other and unspecified hyperlipidemia    Prostate cancer ([place]) 2011   Vitamin D deficiency     Patient Active Problem List   Diagnosis Date Noted   PCP NOTES >>>>>>>>>> 11/09/2020   Blepharitis of right upper eyelid 03/05/2019   Vitamin D deficiency 07/14/2018   Annual physical exam 12/06/2016   Obesity 12/06/2016   RBBB (right bundle branch block) 05/26/2015   Prostate cancer ([place]) 10/01/2011   Hyperlipidemia 10/01/2011   HEARING LOSS, BILATERAL 01/29/2010   ELEVATED BLOOD PRESSURE WITHOUT DIAGNOSIS OF HYPERTENSION 01/29/2010    Past Surgical History:  Procedure Laterality Date   PROSTATECTOMY  2011   Dr Darcus Austin, [facility]       Family History  Problem Relation Age of Onset   Cancer Maternal Grandfather         bladder   Diabetes Paternal Grandfather        TIAs; CVA   Stroke Paternal Grandfather        early 98s   Heart attack Mother 70   Transient ischemic attack Paternal Uncle    Dementia Father        CVAs   Diabetes Father        borderline   Colon cancer Neg Hx     Social History   Tobacco Use   Smoking status: Never   Smokeless tobacco: Never  Substance Use Topics   Alcohol use: Not Currently    Comment: Rarely   Drug use: No    Home Medications Prior to Admission medications   Medication Sig Start Date End Date Taking? Authorizing Provider  aspirin EC 81 MG tablet Take 81 mg by mouth daily. Swallow whole. 5 nights a week.    [provider]  Cholecalciferol (VITAMIN D3) 1000 UNITS CAPS Take by mouth daily.    [provider]  cyanocobalamin 100 MCG tablet Take 100 mcg by mouth daily.    [provider]  fish oil-omega-3 fatty acids 1000 MG capsule Take 1 g by mouth daily.    [provider]  Magnesium 400 MG CAPS Take 1 tablet by mouth daily.    [provider]  Multiple Vitamin (MULTIVITAMINS PO) Take by mouth daily.    [provider]  Allergies    Ciprofloxacin, Ditropan [oxybutynin chloride], and Codeine  Review of Systems   Review of Systems  Constitutional:  Negative for chills, fatigue and fever.  HENT:  Negative for congestion, rhinorrhea and sore throat.   Eyes:  Negative for pain, discharge, redness and itching.  Respiratory:  Negative for cough, shortness of breath and wheezing.   Cardiovascular:  Positive for chest pain.  Gastrointestinal:  Negative for abdominal pain, diarrhea, nausea and vomiting.  Genitourinary:  Negative for dysuria and flank pain.  Musculoskeletal:  Negative for arthralgias and myalgias.       Left arm pain.  Skin:  Negative for rash.  Neurological:  Negative for dizziness, syncope, weakness, numbness and headaches.   Physical Exam Updated Vital Signs BP 135/80 (BP  Location: Right Arm)   Pulse 64   Temp 98.8 F (37.1 C) (Oral)   Resp 16   Ht 5\' 11"  (1.803 m)   Wt 93 kg   SpO2 98%   BMI 28.59 kg/m   Physical Exam  ED Results / Procedures / Treatments   Labs (all labs ordered are listed, but only abnormal results are displayed) Labs Reviewed  BASIC METABOLIC PANEL - Abnormal; Notable for the following components:      Result Value   Glucose, Bld 143 (*)    All other components within normal limits  TROPONIN I (HIGH SENSITIVITY) - Abnormal; Notable for the following components:   Troponin I (High Sensitivity) 381 (*)    All other components within normal limits  CBC  TROPONIN I (HIGH SENSITIVITY)    EKG EKG Interpretation  Date/Time:  Thursday August 23 2021 09:45:31 EDT Ventricular Rate:  69 PR Interval:  146 QRS Duration: 128 QT Interval:  398 QTC Calculation: 426 R Axis:   -31 Text Interpretation: Normal sinus rhythm Left axis deviation Right bundle branch block Abnormal ECG Confirmed by Arab ([id number]) on 08/23/2021 11:15:00 AM  Radiology DG Chest 2 View  Result Date: 08/23/2021 CLINICAL DATA:  Chest pain. EXAM: CHEST - 2 VIEW COMPARISON:  No pertinent prior exams available for comparison. FINDINGS: Heart size within normal limits. Aortic atherosclerosis. Minimal atelectasis versus scarring within the lateral left lung base. No appreciable airspace consolidation or pulmonary edema. No evidence of pleural effusion or pneumothorax. No acute bony abnormality identified. IMPRESSION: Minimal atelectasis versus scarring within the lateral left lung base. Otherwise, no evidence of acute cardiopulmonary abnormality. Aortic Atherosclerosis (ICD10-I70.0). Electronically Signed   By: Kellie Simmering D.O.   On: 08/23/2021 10:07    Procedures Procedures   Medications Ordered in ED Medications  aspirin EC tablet 325 mg (325 mg Oral Given 08/23/21 1128)    ED Course  I have reviewed the triage vital signs and the nursing  notes.  Pertinent labs & imaging results that were available during my care of the patient were reviewed by me and considered in my medical decision making (see chart for details).    MDM Rules/Calculators/A&P                           Patient presents emergency department for evaluation of left arm pain and chest pain.  Physical exam is unremarkable.  Laboratory evaluation with an initial elevated troponin to 381, delta troponin 403.  Laboratory evaluation otherwise unremarkable.  Chest x-ray with mild left atelectasis but no evidence of pneumonia.  ECG nonischemic with a right bundle branch block.  Cardiology consulted who recommended expedited catheterization and the  patient was given aspirin and started on a heparin drip.  Patient then transferred to [facility] for urgent catheterization. Final Clinical Impression(s) / ED Diagnoses Final diagnoses:  None    Rx / DC Orders ED Discharge Orders     None        Janeese Mcgloin, Debe Coder, MD 08/23/21 [phone]

## 2021-08-24 ENCOUNTER — Encounter (HOSPITAL_COMMUNITY): Payer: Self-pay | Admitting: Interventional Cardiology

## 2021-08-24 ENCOUNTER — Inpatient Hospital Stay (HOSPITAL_COMMUNITY): Payer: Medicare Other

## 2021-08-24 DIAGNOSIS — I25110 Atherosclerotic heart disease of native coronary artery with unstable angina pectoris: Secondary | ICD-10-CM

## 2021-08-24 DIAGNOSIS — R079 Chest pain, unspecified: Secondary | ICD-10-CM | POA: Diagnosis not present

## 2021-08-24 DIAGNOSIS — I214 Non-ST elevation (NSTEMI) myocardial infarction: Secondary | ICD-10-CM | POA: Diagnosis not present

## 2021-08-24 DIAGNOSIS — I10 Essential (primary) hypertension: Secondary | ICD-10-CM

## 2021-08-24 LAB — ECHOCARDIOGRAM COMPLETE
AV Mean grad: 4 mmHg
AV Peak grad: 6 mmHg
Ao pk vel: 1.22 m/s
Area-P 1/2: 2.78 cm2
Height: 71 in
S' Lateral: 3.3 cm
Weight: 3392 oz

## 2021-08-24 LAB — CBC
HCT: 39.3 % (ref 39.0–52.0)
Hemoglobin: 13.1 g/dL (ref 13.0–17.0)
MCH: 31.6 pg (ref 26.0–34.0)
MCHC: 33.3 g/dL (ref 30.0–36.0)
MCV: 94.7 fL (ref 80.0–100.0)
Platelets: 210 10*3/uL (ref 150–400)
RBC: 4.15 MIL/uL — ABNORMAL LOW (ref 4.22–5.81)
RDW: 12.2 % (ref 11.5–15.5)
WBC: 9.5 10*3/uL (ref 4.0–10.5)
nRBC: 0 % (ref 0.0–0.2)

## 2021-08-24 LAB — HEPARIN LEVEL (UNFRACTIONATED)
Heparin Unfractionated: <0.1 IU/mL — ABNORMAL LOW (ref 0.30–0.70)
Heparin Unfractionated: 0.12 IU/mL — ABNORMAL LOW (ref 0.30–0.70)
Heparin Unfractionated: 0.42 IU/mL (ref 0.30–0.70)

## 2021-08-24 MED ORDER — INFLUENZA VAC A&B SA ADJ QUAD 0.5 ML IM PRSY
0.5000 mL | PREFILLED_SYRINGE | INTRAMUSCULAR | Status: DC
  Filled 2021-08-24: qty 0.5

## 2021-08-24 MED ORDER — LORATADINE 10 MG PO TABS
10.0000 mg | ORAL_TABLET | Freq: Every day | ORAL | Status: DC
  Administered 2021-08-24 – 2021-08-28 (×5): 10 mg via ORAL
  Filled 2021-08-24 (×5): qty 1

## 2021-08-24 MED ORDER — GUAIFENESIN 100 MG/5ML PO LIQD
5.0000 mL | ORAL | Status: DC | PRN
  Administered 2021-08-24 – 2021-08-27 (×8): 5 mL via ORAL
  Filled 2021-08-24 (×8): qty 5

## 2021-08-24 MED ORDER — PERFLUTREN LIPID MICROSPHERE
1.0000 mL | INTRAVENOUS | Status: AC | PRN
Start: 2021-08-24 — End: 2021-08-24
  Administered 2021-08-24: 2 mL via INTRAVENOUS
  Filled 2021-08-24: qty 10

## 2021-08-24 MED ORDER — METOPROLOL TARTRATE 25 MG PO TABS
25.0000 mg | ORAL_TABLET | Freq: Two times a day (BID) | ORAL | Status: DC
  Administered 2021-08-24 – 2021-08-28 (×10): 25 mg via ORAL
  Filled 2021-08-24 (×10): qty 1

## 2021-08-24 NOTE — Progress Notes (Signed)
ANTICOAGULATION CONSULT NOTE   Pharmacy Consult for heparin Indication: chest pain/ACS  Allergies  Allergen Reactions   Ciprofloxacin     Hives post op   Ditropan [Oxybutynin Chloride]     Hives 2011, post op   Codeine     Nausea and headaches     Patient Measurements: Height: 5\' 11"  (180.3 cm) Weight: 96.2 kg (212 lb) IBW/kg (Calculated) : 75.3 Heparin Dosing Weight: 93kg  Vital Signs: Temp: 98.3 F (36.8 C) (10/28 0514) Temp Source: Oral (10/28 0514) BP: 138/66 (10/28 0514) Pulse Rate: 61 (10/28 0514)  Labs: Recent Labs    08/23/21 0949 08/23/21 1200 08/24/21 0335  HGB 14.6  --  13.1  HCT 42.3  --  39.3  PLT 202  --  210  HEPARINUNFRC  --   --  <0.10*  CREATININE 0.95  --   --   TROPONINIHS 381* 403*  --      Estimated Creatinine Clearance: 79.5 mL/min (by C-G formula based on SCr of 0.95 mg/dL).   Medical History: Past Medical History:  Diagnosis Date   Elevated blood-pressure reading without diagnosis of hypertension    Other and unspecified hyperlipidemia    Prostate cancer ([place]) 2011   Vitamin D deficiency      Assessment: 71 YOM presenting with CP, elevated troponin, he is not on anticoagulation PTA, CBC wnl.  Pharmacy consulted for heparin infusion.  Heparin bolus and infusion started ~ 12:30 today 10/27.  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  TCTS consulted. Heparin level below goal this morning on 1200 units/hr. Hgb down slightly from 14>13 but no bleeding issues noted.   Goal of Therapy:  Heparin level 0.3-0.7 units/ml Monitor platelets by anticoagulation protocol: Yes   Plan:  Increase heparin 1500 units/hr Recheck heparin level tonight  Thank you for allowing pharmacy to be part of this patients care team.  Erin Hearing PharmD., BCPS Clinical Pharmacist 08/24/2021 7:43 AM

## 2021-08-24 NOTE — Progress Notes (Signed)
ANTICOAGULATION CONSULT NOTE   Pharmacy Consult for heparin Indication: chest pain/ACS  Allergies  Allergen Reactions   Ciprofloxacin     Hives post op   Ditropan [Oxybutynin Chloride]     Hives 2011, post op   Codeine     Nausea and headaches     Patient Measurements: Height: 5\' 11"  (180.3 cm) Weight: 96.2 kg (212 lb) IBW/kg (Calculated) : 75.3 Heparin Dosing Weight: 93kg  Vital Signs:    Labs: Recent Labs    08/23/21 0949 08/23/21 1200 08/24/21 0335 08/24/21 0953 08/24/21 1938  HGB 14.6  --  13.1  --   --   HCT 42.3  --  39.3  --   --   PLT 202  --  210  --   --   HEPARINUNFRC  --   --  <0.10* 0.12* 0.42  CREATININE 0.95  --   --   --   --   TROPONINIHS 381* 403*  --   --   --      Estimated Creatinine Clearance: 79.5 mL/min (by C-G formula based on SCr of 0.95 mg/dL).   Medical History: Past Medical History:  Diagnosis Date   Elevated blood-pressure reading without diagnosis of hypertension    Other and unspecified hyperlipidemia    Prostate cancer ([place]) 2011   Vitamin D deficiency      Assessment: 2 YOM presenting with CP, elevated troponin, he is not on anticoagulation PTA, CBC wnl.  Pharmacy consulted for heparin infusion.  Heparin bolus and infusion started ~ 12:30 today 10/27.  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  TCTS consulted. Heparin level this evening came back therapeutic at 0.42, on 1500 units/hr. No s/sx of bleeding or infusion issues per RN.   Goal of Therapy:  Heparin level 0.3-0.7 units/ml Monitor platelets by anticoagulation protocol: Yes   Plan:  Continue heparin at 1500 units/hr Monitor daily HL, CBC, and for s/sx of bleeding   Thank you for allowing pharmacy to be part of this patients care team.  Antonietta Jewel, PharmD, [place] Clinical Pharmacist  Phone: [phone] 08/24/2021 8:28 PM  Please check AMION for all [place] phone numbers After 10:00 PM, call [place] [phone]

## 2021-08-24 NOTE — Progress Notes (Addendum)
Progress Note  Patient Name: Kyle Strickland Date of Encounter: 08/24/2021  [facility] Cardiologist: Larae Grooms, MD   Subjective   No complaints this morning  Inpatient Medications    Scheduled Meds:  aspirin EC  81 mg Oral Daily   atorvastatin  80 mg Oral Daily   metoprolol tartrate  12.5 mg Oral BID   sodium chloride flush  3 mL Intravenous Q12H   sodium chloride flush  3 mL Intravenous Q12H   Continuous Infusions:  sodium chloride     heparin 1,200 Units/hr (08/24/21 0139)   PRN Meds: sodium chloride, acetaminophen, nitroGLYCERIN, ondansetron (ZOFRAN) IV, sodium chloride flush   Vital Signs    Vitals:   08/23/21 1716 08/23/21 1837 08/23/21 2011 08/24/21 0514  BP: 136/80  (!) 150/77 138/66  Pulse: 76 62 64 61  Resp: 12  19 15   Temp:   98.4 F (36.9 C) 98.3 F (36.8 C)  TempSrc:   Oral Oral  SpO2: 100% 100% 98% 97%  Weight:      Height:        Intake/Output Summary (Last 24 hours) at 08/24/2021 0911 Last data filed at 08/24/2021 0515 Gross per 24 hour  Intake --  Output 1200 ml  Net -1200 ml   Last 3 Weights 08/23/2021 08/23/2021 07/10/2021  Weight (lbs) 212 lb 205 lb 212 lb  Weight (kg) 96.163 kg 92.987 kg 96.163 kg      Telemetry    SR - Personally Reviewed  ECG    SR, RBBB TWI in septal leads - Personally Reviewed  Physical Exam   GEN: No acute distress.   Neck: No JVD Cardiac: RRR, no murmurs, rubs, or gallops.  Respiratory: Clear to auscultation bilaterally. GI: Soft, nontender, non-distended  MS: No edema; No deformity. Right radial site stable.  Neuro:  Nonfocal  Psych: Normal affect   Labs    High Sensitivity Troponin:   Recent Labs  Lab 08/23/21 0949 08/23/21 1200  TROPONINIHS 381* 403*     Chemistry Recent Labs  Lab 08/23/21 0949  NA 137  K 4.4  CL 103  CO2 25  GLUCOSE 143*  BUN 16  CREATININE 0.95  CALCIUM 10.3  GFRNONAA >60  ANIONGAP 9    Lipids No results for input(s): CHOL, TRIG, HDL,  LABVLDL, LDLCALC, CHOLHDL in the last 168 hours.  Hematology Recent Labs  Lab 08/23/21 0949 08/24/21 0335  WBC 8.7 9.5  RBC 4.53 4.15*  HGB 14.6 13.1  HCT 42.3 39.3  MCV 93.4 94.7  MCH 32.2 31.6  MCHC 34.5 33.3  RDW 12.1 12.2  PLT 202 210   Thyroid No results for input(s): TSH, FREET4 in the last 168 hours.  BNPNo results for input(s): BNP, PROBNP in the last 168 hours.  DDimer No results for input(s): DDIMER in the last 168 hours.   Radiology    DG Chest 2 View  Result Date: 08/23/2021 CLINICAL DATA:  Chest pain. EXAM: CHEST - 2 VIEW COMPARISON:  No pertinent prior exams available for comparison. FINDINGS: Heart size within normal limits. Aortic atherosclerosis. Minimal atelectasis versus scarring within the lateral left lung base. No appreciable airspace consolidation or pulmonary edema. No evidence of pleural effusion or pneumothorax. No acute bony abnormality identified. IMPRESSION: Minimal atelectasis versus scarring within the lateral left lung base. Otherwise, no evidence of acute cardiopulmonary abnormality. Aortic Atherosclerosis (ICD10-I70.0). Electronically Signed   By: Kellie Simmering D.O.   On: 08/23/2021 10:07   CARDIAC CATHETERIZATION  Result Date: 08/23/2021  Mid RCA lesion is 100% stenosed.   Mid LAD lesion is 95% stenosed.   Prox LAD to Mid LAD lesion is 75% stenosed.   1st Diag lesion is 70% stenosed.   Prox Cx lesion is 90% stenosed.   1st Mrg lesion is 75% stenosed.   The left ventricular systolic function is normal.   LV end diastolic pressure is normal.   The left ventricular ejection fraction is 55-65% by visual estimate.   There is no aortic valve stenosis. Severe, complex, multivessel CAD. Plan for cardiac surgery consult.  Start high potency statin.  Add low dose beta blocker. Restart IV heparin 8 hours post sheath pull.     Cardiac Studies   Cath: 08/23/21  Mid RCA lesion is 100% stenosed.   Mid LAD lesion is 95% stenosed.   Prox LAD to Mid LAD lesion  is 75% stenosed.   1st Diag lesion is 70% stenosed.   Prox Cx lesion is 90% stenosed.   1st Mrg lesion is 75% stenosed.   The left ventricular systolic function is normal.   LV end diastolic pressure is normal.   The left ventricular ejection fraction is 55-65% by visual estimate.   There is no aortic valve stenosis.   Severe, complex, multivessel CAD.    Plan for cardiac surgery consult.     Start high potency statin.  Add low dose beta blocker. Restart IV heparin 8 hours post sheath pull.    Diagnostic Dominance: Co-dominant   Patient Profile     75 y.o. male no history of CAD or tobacco use who is being seen 08/23/2021 for the evaluation of chest pain to med [facility].   Assessment & Plan    NSTEMI: hsTn peaked 403, underwent cardiac cath noted above with 3v CAD. TCTS consulted. No chest  pain overnight.  -- remains on IV heparin, ASA, statin, metoprolol 12.5mg  BID  -- echo pending   HLD: LDL 127 (07/10/21) -- started on atorvastatin 80mg  daily  HTN: started on metoprolol 12.5mg  BID on admission, further titrated to 25mg  BID  For questions or updates, please contact [place] Please consult [email or web address] for contact info under   Signed, Reino Bellis, NP  08/24/2021, 9:11 AM    Patient seen, examined. Available data reviewed. Agree with findings, assessment, and plan as outlined by Reino Bellis, NP.  The patient is doing well this morning with no chest pain or shortness of breath.  On my exam, he is alert, oriented, no distress.  Lungs are clear, heart is regular rate and rhythm no murmur gallop, right radial site is clear, abdomen is soft and nontender with no organomegaly, extremities have no edema, skin is warm and dry with no rash.  I have personally reviewed his coronary angiogram which shows complex multivessel CAD with total occlusion of the nondominant RCA, severe diffuse proximal mid LAD stenosis, and severe complex circumflex/OM bifurcation  stenosis.  The patient is awaiting cardiac surgical consultation for multivessel CABG.  By coronary angiography, he appears to have good targets for CABG.  His 2D echocardiogram has been completed with the interpretation currently pending.  We will continue his current medical program which includes aspirin, high intensity statin drug, beta-blocker, and IV heparin.  Sherren Mocha, M.D. 08/24/2021 10:59 AM

## 2021-08-24 NOTE — Progress Notes (Signed)
Heparin gtt started on pt at 0139. Talked with Santiago Glad in pharmacy about heparin level drawn on pt this am. Per pharmacy will be redrawn at a later time today no changes indicated at this time. Talked with Santiago Glad in pharmacy about pt.

## 2021-08-24 NOTE — Plan of Care (Signed)
  Problem: Activity: Goal: Ability to return to baseline activity level will improve Outcome: Progressing   Problem: Cardiovascular: Goal: Ability to achieve and maintain adequate cardiovascular perfusion will improve Outcome: Progressing Goal: Vascular access site(s) Level 0-1 will be maintained Outcome: Progressing   Problem: Health Behavior/Discharge Planning: Goal: Ability to safely manage health-related needs after discharge will improve Outcome: Progressing   

## 2021-08-24 NOTE — Consult Note (Addendum)
[address]       [place] [address]             [phone]        Kyle Strickland [place] Medical Record #[id number] Date of Birth: 07-Jun-1946  Referring: Scarlette Calico Primary Care: Colon Branch, MD Primary Cardiologist:Jayadeep Irish Lack, MD  Chief Complaint:    Chief Complaint  Patient presents with   Chest Pain   History of Present Illness:     [name] is a 75 yo male with history of HTN and prostate cancer S/P Radical Prostatectomy.  He presented to [facility] on 08/23/2021 with complaints of left arm pain and numbness.  The patient had been experiencing these episodes over the past week and he had experienced at least 5 separate episodes.  They would last about 45 seconds and the patient describes the episodes as a "blood pressure cuff squeezing on his arm."  The pain radiated into his chest and occur with exertion.  He denied associated N/V and diaphoresis.  He did experience some shortness of breath with these episodes.  Workup in the ED showed EKG with a right bundle branch block.  Troponin levels were also elevated.  Cardiology was consulted and they felt catheterization would be indicated.  The patient was placed on a Heparin drip and transferred to [facility] for further care.  He was chest pain free on arrival.  He was taken to the catheterization lab on 10/27 and was found to have a preserved EF and multivessel CAD.  Cardiology felt the patient would benefit from coronary bypass grafting procedure.  [facility] was requested.  The patient has remained chest pain free.  He is active without too much issue until recently.  He does admit to have decreased energy and his wife states he falls asleep quite frequently during the day at just 15 min intervals.   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  He has already experienced one on  the Heparin.  He is nervous/afraid about surgery.  His wife is having some health issues his daughter recently completed breast cancer treatment and they are trying to figure out how to juggle everything.  Current Activity/ Functional Status: Patient is independent with mobility/ambulation, transfers, ADL's, IADL's.   Zubrod Score: At the time of surgery this patient's most appropriate activity status/level should be described as: []     0    Normal activity, no symptoms [x]     1    Restricted in physical strenuous activity but ambulatory, able to do out light work []     2    Ambulatory and capable of self care, unable to do work activities, up and about                 more than 50%  Of the time                            []     3    Only limited self care, in bed greater than 50% of waking hours []     4    Completely disabled, no self care, confined to bed or chair []     5    Moribund  Past Medical History:  Diagnosis Date   Elevated blood-pressure reading without diagnosis of  hypertension    Other and unspecified hyperlipidemia    Prostate cancer ([place]) 2011   Vitamin D deficiency     Past Surgical History:  Procedure Laterality Date   LEFT HEART CATH AND CORONARY ANGIOGRAPHY N/A 08/23/2021   Procedure: LEFT HEART CATH AND CORONARY ANGIOGRAPHY;  Surgeon: Jettie Booze, MD;  Location: [facility];  Service: Cardiovascular;  Laterality: N/A;   PROSTATECTOMY  2011   Dr Darcus Austin, [facility]    Social History   Tobacco Use  Smoking Status Never  Smokeless Tobacco Never    Social History   Substance and Sexual Activity  Alcohol Use Not Currently   Comment: Rarely     Allergies  Allergen Reactions   Ciprofloxacin     Hives post op   Ditropan [Oxybutynin Chloride]     Hives 2011, post op   Codeine     Nausea and headaches     Current Facility-Administered Medications  Medication Dose Route Frequency Provider Last Rate Last Admin   0.9 %  sodium chloride infusion   250 mL Intravenous PRN Jettie Booze, MD       acetaminophen (TYLENOL) tablet 650 mg  650 mg Oral Q4H PRN Jettie Booze, MD       aspirin EC tablet 81 mg  81 mg Oral Daily Jettie Booze, MD   81 mg at 08/24/21 0931   atorvastatin (LIPITOR) tablet 80 mg  80 mg Oral Daily Jettie Booze, MD   80 mg at 08/24/21 0931   heparin ADULT infusion 100 units/mL (25000 units/245mL)  1,200 Units/hr Intravenous Continuous Wendee Beavers, RPH 12 mL/hr at 08/24/21 0139 1,200 Units/hr at 08/24/21 0139   [START ON 08/25/2021] influenza vaccine adjuvanted (FLUAD) injection 0.5 mL  0.5 mL Intramuscular Tomorrow-1000 Jettie Booze, MD       metoprolol tartrate (LOPRESSOR) tablet 25 mg  25 mg Oral BID Reino Bellis B, NP   25 mg at 08/24/21 0931   nitroGLYCERIN (NITROSTAT) SL tablet 0.4 mg  0.4 mg Sublingual Q5 Min x 3 PRN Jettie Booze, MD       ondansetron [facility]) injection 4 mg  4 mg Intravenous Q6H PRN Jettie Booze, MD       sodium chloride flush (NS) 0.9 % injection 3 mL  3 mL Intravenous Q12H Jettie Booze, MD   3 mL at 08/24/21 0932   sodium chloride flush (NS) 0.9 % injection 3 mL  3 mL Intravenous Q12H Jettie Booze, MD   3 mL at 08/24/21 0931   sodium chloride flush (NS) 0.9 % injection 3 mL  3 mL Intravenous PRN Jettie Booze, MD        Medications Prior to Admission  Medication Sig Dispense Refill Last Dose   aspirin EC 81 MG tablet Take 81 mg by mouth daily. Swallow whole. 5 nights a week.      Cholecalciferol (VITAMIN D3) 1000 UNITS CAPS Take by mouth daily.      cyanocobalamin 100 MCG tablet Take 100 mcg by mouth daily.      fish oil-omega-3 fatty acids 1000 MG capsule Take 1 g by mouth daily.      Magnesium 400 MG CAPS Take 1 tablet by mouth daily.      Multiple Vitamin (MULTIVITAMINS PO) Take by mouth daily.       Family History  Problem Relation Age of Onset   Cancer Maternal Grandfather        bladder  Diabetes  Paternal Grandfather        TIAs; CVA   Stroke Paternal Grandfather        early 68s   Heart attack Mother 32   Transient ischemic attack Paternal Uncle    Dementia Father        CVAs   Diabetes Father        borderline   Colon cancer Neg Hx     Review of Systems:   ROS    Cardiac Review of Systems: Y or  [    ]= no  Chest Pain [  Y  ]  Resting SOB [   ] Exertional SOB  [ Y ]  Orthopnea [  ]   Pedal Edema [ N  ]    Palpitations [ N ] Syncope  [  ]   Presyncope [   ]  General Review of Systems: [Y] = yes [  ]=no Constitional: recent weight change [  ]; anorexia [  ]; fatigue [ Y ]; nausea [ N ]; night sweats [  ]; fever [  ]; or chills [  ]                                                               Dental: Last Dentist visit:   Eye : blurred vision [  ]; diplopia [   ]; vision changes [  ];  Amaurosis fugax[  ]; Resp: cough [  ];  wheezing[  ];  hemoptysis[  ]; shortness of breath[  ]; paroxysmal nocturnal dyspnea[  ]; dyspnea on exertion[ Y ]; or orthopnea[  ];  GI:  gallstones[  ], vomiting[N  ];  dysphagia[  ]; melena[  ];  hematochezia [  ]; heartburn[  ];   Hx of  Colonoscopy[  ]; GU: kidney stones [  ]; hematuria[  ];   dysuria [  ];  nocturia[  ];  history of     obstruction [  ]; urinary frequency [  ]             Skin: rash, swelling[N  ];, hair loss[  ];  peripheral edema[  ];  or itching[  ]; Musculosketetal: myalgias[  ];  joint swelling[  ];  joint erythema[  ];  joint pain[  ];  back pain[  ];  Heme/Lymph: bruising[  ];  bleeding[  ];  anemia[  ];  Neuro: TIA[  ];  headaches[  ];  stroke[N  ];  vertigo[  ];  seizures[  ];   paresthesias[  ];  difficulty walking[  ];  Psych:depression[ N ]; anxiety[N  ];  Endocrine: diabetes[ N ];  thyroid dysfunction[ N ];   Physical Exam: BP 138/66 (BP Location: Left Wrist)   Pulse 61   Temp 98.3 F (36.8 C) (Oral)   Resp 15   Ht 5\' 11"  (1.803 m)   Wt 96.2 kg   SpO2 97%   BMI 29.57 kg/m    General appearance: alert,  cooperative, and no distress Head: Normocephalic, without obvious abnormality, atraumatic Neck: no adenopathy, no carotid bruit, no JVD, supple, symmetrical, trachea midline, and thyroid not enlarged, symmetric, no tenderness/mass/nodules Resp: clear to auscultation bilaterally Cardio: regular rate and rhythm GI: soft, non-tender; bowel sounds normal; no masses,  no organomegaly Extremities: extremities normal, atraumatic, no cyanosis or edema Neurologic: Grossly normal  Diagnostic Studies & Laboratory data:     Recent Radiology Findings:   DG Chest 2 View  Result Date: 08/23/2021 CLINICAL DATA:  Chest pain. EXAM: CHEST - 2 VIEW COMPARISON:  No pertinent prior exams available for comparison. FINDINGS: Heart size within normal limits. Aortic atherosclerosis. Minimal atelectasis versus scarring within the lateral left lung base. No appreciable airspace consolidation or pulmonary edema. No evidence of pleural effusion or pneumothorax. No acute bony abnormality identified. IMPRESSION: Minimal atelectasis versus scarring within the lateral left lung base. Otherwise, no evidence of acute cardiopulmonary abnormality. Aortic Atherosclerosis (ICD10-I70.0). Electronically Signed   By: Kellie Simmering D.O.   On: 08/23/2021 10:07   CARDIAC CATHETERIZATION  Result Date: 08/23/2021   Mid RCA lesion is 100% stenosed.   Mid LAD lesion is 95% stenosed.   Prox LAD to Mid LAD lesion is 75% stenosed.   1st Diag lesion is 70% stenosed.   Prox Cx lesion is 90% stenosed.   1st Mrg lesion is 75% stenosed.   The left ventricular systolic function is normal.   LV end diastolic pressure is normal.   The left ventricular ejection fraction is 55-65% by visual estimate.   There is no aortic valve stenosis. Severe, complex, multivessel CAD. Plan for cardiac surgery consult.  Start high potency statin.  Add low dose beta blocker. Restart IV heparin 8 hours post sheath pull.      I have independently reviewed the above radiologic  studies and discussed with the patient   Recent Lab Findings: Lab Results  Component Value Date   WBC 9.5 08/24/2021   HGB 13.1 08/24/2021   HCT 39.3 08/24/2021   PLT 210 08/24/2021   GLUCOSE 143 (H) 08/23/2021   CHOL 193 07/10/2021   TRIG 115.0 07/10/2021   HDL 42.70 07/10/2021   LDLCALC 127 (H) 07/10/2021   ALT 16 10/23/2020   AST 15 10/23/2020   NA 137 08/23/2021   K 4.4 08/23/2021   CL 103 08/23/2021   CREATININE 0.95 08/23/2021   BUN 16 08/23/2021   CO2 25 08/23/2021   TSH 1.46 10/23/2020   HGBA1C 5.7 10/26/2020    Assessment / Plan:     CAD- multivessel CAD, has been chest pain free since hospitalization.  Requesting CABG HTN H/O Prostate Cancer with prostatectomy over 10 years ago 73- patient stable, currently remains chest pain free, patient appears to be good candidate for surgery.  Will discuss with Dr. [name] and follow up with further recommendations and timing of surgery.  First OR opening is Wednesday of next week.   I  spent 55 minutes counseling the patient face to face.  Ellwood Handler, PA-C 08/24/2021 9:50 AM   Patient seen and examined, agree with history and physical as detailed above Presents with unstable coronary syndrome. Cath showed severe 3 vessel CAD. CABG indicated for survival benefit and relief of symptoms  I discussed the general nature of the procedure, including the need for general anesthesia, the use of cardiopulmonary bypass, the incisions to be used, the use of drainage tubes and pacing wires postoperatively with Mr and Mrs Weisel. We discussed the expected hospital stay, overall recovery and short and long term outcomes. I informed him of the indications, risks, benefits and alternatives.  They understand the risks include, but are not limited to death, stroke, MI, DVT/PE, bleeding, possible need for transfusion, infections, cardiac arrhythmias, as well as other organ system dysfunction including  respiratory, renal, or GI  complications.   He accepts the risks and agrees to proceed.   [name] [name], MD [facility] [phone]

## 2021-08-24 NOTE — H&P (View-Only) (Signed)
[address]       [place] [address]             [phone]        Kyle Strickland [place] Medical Record #[id number] Date of Birth: 1946/03/28  Referring: Scarlette Calico Primary Care: Colon Branch, MD Primary Cardiologist:Jayadeep Irish Lack, MD  Chief Complaint:    Chief Complaint  Patient presents with   Chest Pain   History of Present Illness:     Kyle Strickland is a 75 yo male with history of HTN and prostate cancer S/P Radical Prostatectomy.  He presented to [facility] [place] on 08/23/2021 with complaints of left arm pain and numbness.  The patient had been experiencing these episodes over the past week and he had experienced at least 5 separate episodes.  They would last about 45 seconds and the patient describes the episodes as a "blood pressure cuff squeezing on his arm."  The pain radiated into his chest and occur with exertion.  He denied associated N/V and diaphoresis.  He did experience some shortness of breath with these episodes.  Workup in the ED showed EKG with a right bundle branch block.  Troponin levels were also elevated.  Cardiology was consulted and they felt catheterization would be indicated.  The patient was placed on a Heparin drip and transferred to [facility] for further care.  He was chest pain free on arrival.  He was taken to the catheterization lab on 10/27 and was found to have a preserved EF and multivessel CAD.  Cardiology felt the patient would benefit from coronary bypass grafting procedure.  [facility] consult was requested.  The patient has remained chest pain free.  He is active without too much issue until recently.  He does admit to have decreased energy and his wife states he falls asleep quite frequently during the day at just 15 min intervals.   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  He has already experienced one on  the Heparin.  He is nervous/afraid about surgery.  His wife is having some health issues his daughter recently completed breast cancer treatment and they are trying to figure out how to juggle everything.  Current Activity/ Functional Status: Patient is independent with mobility/ambulation, transfers, ADL's, IADL's.   Zubrod Score: At the time of surgery this patient's most appropriate activity status/level should be described as: []     0    Normal activity, no symptoms [x]     1    Restricted in physical strenuous activity but ambulatory, able to do out light work []     2    Ambulatory and capable of self care, unable to do work activities, up and about                 more than 50%  Of the time                            []     3    Only limited self care, in bed greater than 50% of waking hours []     4    Completely disabled, no self care, confined to bed or chair []     5    Moribund  Past Medical History:  Diagnosis Date   Elevated blood-pressure reading without diagnosis of  hypertension    Other and unspecified hyperlipidemia    [place] 2011   Vitamin D deficiency     Past Surgical History:  Procedure Laterality Date   LEFT HEART CATH AND CORONARY ANGIOGRAPHY N/A 08/23/2021   Procedure: LEFT HEART CATH AND CORONARY ANGIOGRAPHY;  Surgeon: Jettie Booze, MD;  Location: [place] [facility];  Service: Cardiovascular;  Laterality: N/A;   PROSTATECTOMY  2011   Dr Darcus Austin, [facility]    Social History   Tobacco Use  Smoking Status Never  Smokeless Tobacco Never    Social History   Substance and Sexual Activity  Alcohol Use Not Currently   Comment: Rarely     Allergies  Allergen Reactions   Ciprofloxacin     Hives post op   Ditropan [Oxybutynin Chloride]     Hives 2011, post op   Codeine     Nausea and headaches     Current Facility-Administered Medications  Medication Dose Route Frequency Provider Last Rate Last Admin   0.9 %  sodium chloride infusion   250 mL Intravenous PRN Jettie Booze, MD       acetaminophen (TYLENOL) tablet 650 mg  650 mg Oral Q4H PRN Jettie Booze, MD       aspirin EC tablet 81 mg  81 mg Oral Daily Jettie Booze, MD   81 mg at 08/24/21 0931   atorvastatin (LIPITOR) tablet 80 mg  80 mg Oral Daily Jettie Booze, MD   80 mg at 08/24/21 0931   heparin ADULT infusion 100 units/mL (25000 units/270mL)  1,200 Units/hr Intravenous Continuous Wendee Beavers, RPH 12 mL/hr at 08/24/21 0139 1,200 Units/hr at 08/24/21 0139   [START ON 08/25/2021] influenza vaccine adjuvanted (FLUAD) injection 0.5 mL  0.5 mL Intramuscular Tomorrow-1000 Jettie Booze, MD       metoprolol tartrate (LOPRESSOR) tablet 25 mg  25 mg Oral BID Reino Bellis B, NP   25 mg at 08/24/21 0931   nitroGLYCERIN (NITROSTAT) SL tablet 0.4 mg  0.4 mg Sublingual Q5 Min x 3 PRN Jettie Booze, MD       ondansetron [facility]) injection 4 mg  4 mg Intravenous Q6H PRN Jettie Booze, MD       sodium chloride flush (NS) 0.9 % injection 3 mL  3 mL Intravenous Q12H Jettie Booze, MD   3 mL at 08/24/21 0932   sodium chloride flush (NS) 0.9 % injection 3 mL  3 mL Intravenous Q12H Jettie Booze, MD   3 mL at 08/24/21 0931   sodium chloride flush (NS) 0.9 % injection 3 mL  3 mL Intravenous PRN Jettie Booze, MD        Medications Prior to Admission  Medication Sig Dispense Refill Last Dose   aspirin EC 81 MG tablet Take 81 mg by mouth daily. Swallow whole. 5 nights a week.      Cholecalciferol (VITAMIN D3) 1000 UNITS CAPS Take by mouth daily.      cyanocobalamin 100 MCG tablet Take 100 mcg by mouth daily.      fish oil-omega-3 fatty acids 1000 MG capsule Take 1 g by mouth daily.      Magnesium 400 MG CAPS Take 1 tablet by mouth daily.      Multiple Vitamin (MULTIVITAMINS PO) Take by mouth daily.       Family History  Problem Relation Age of Onset   Cancer Maternal Grandfather        bladder  Diabetes  Paternal Grandfather        TIAs; CVA   Stroke Paternal Grandfather        early 54s   Heart attack Mother 60   Transient ischemic attack Paternal Uncle    Dementia Father        CVAs   Diabetes Father        borderline   Colon cancer Neg Hx     Review of Systems:   ROS    Cardiac Review of Systems: Y or  [    ]= no  Chest Pain [  Y  ]  Resting SOB [   ] Exertional SOB  [ Y ]  Orthopnea [  ]   Pedal Edema [ N  ]    Palpitations [ N ] Syncope  [  ]   Presyncope [   ]  General Review of Systems: [Y] = yes [  ]=no Constitional: recent weight change [  ]; anorexia [  ]; fatigue [ Y ]; nausea [ N ]; night sweats [  ]; fever [  ]; or chills [  ]                                                               Dental: Last Dentist visit:   Eye : blurred vision [  ]; diplopia [   ]; vision changes [  ];  Amaurosis fugax[  ]; Resp: cough [  ];  wheezing[  ];  hemoptysis[  ]; shortness of breath[  ]; paroxysmal nocturnal dyspnea[  ]; dyspnea on exertion[ Y ]; or orthopnea[  ];  GI:  gallstones[  ], vomiting[N  ];  dysphagia[  ]; melena[  ];  hematochezia [  ]; heartburn[  ];   Hx of  Colonoscopy[  ]; GU: kidney stones [  ]; hematuria[  ];   dysuria [  ];  nocturia[  ];  history of     obstruction [  ]; urinary frequency [  ]             Skin: rash, swelling[N  ];, hair loss[  ];  peripheral edema[  ];  or itching[  ]; Musculosketetal: myalgias[  ];  joint swelling[  ];  joint erythema[  ];  joint pain[  ];  back pain[  ];  Heme/Lymph: bruising[  ];  bleeding[  ];  anemia[  ];  Neuro: TIA[  ];  headaches[  ];  stroke[N  ];  vertigo[  ];  seizures[  ];   paresthesias[  ];  difficulty walking[  ];  Psych:depression[ N ]; anxiety[N  ];  Endocrine: diabetes[ N ];  thyroid dysfunction[ N ];   Physical Exam: BP 138/66 (BP Location: Left Wrist)   Pulse 61   Temp 98.3 F (36.8 C) (Oral)   Resp 15   Ht 5\' 11"  (1.803 m)   Wt 96.2 kg   SpO2 97%   BMI 29.57 kg/m    General appearance: alert,  cooperative, and no distress Head: Normocephalic, without obvious abnormality, atraumatic Neck: no adenopathy, no carotid bruit, no JVD, supple, symmetrical, trachea midline, and thyroid not enlarged, symmetric, no tenderness/mass/nodules Resp: clear to auscultation bilaterally Cardio: regular rate and rhythm GI: soft, non-tender; bowel sounds normal; no masses,  no organomegaly Extremities: extremities normal, atraumatic, no cyanosis or edema Neurologic: Grossly normal  Diagnostic Studies & Laboratory data:     Recent Radiology Findings:   DG Chest 2 View  Result Date: 08/23/2021 CLINICAL DATA:  Chest pain. EXAM: CHEST - 2 VIEW COMPARISON:  No pertinent prior exams available for comparison. FINDINGS: Heart size within normal limits. Aortic atherosclerosis. Minimal atelectasis versus scarring within the lateral left lung base. No appreciable airspace consolidation or pulmonary edema. No evidence of pleural effusion or pneumothorax. No acute bony abnormality identified. IMPRESSION: Minimal atelectasis versus scarring within the lateral left lung base. Otherwise, no evidence of acute cardiopulmonary abnormality. Aortic Atherosclerosis (ICD10-I70.0). Electronically Signed   By: Kellie Simmering D.O.   On: 08/23/2021 10:07   CARDIAC CATHETERIZATION  Result Date: 08/23/2021   Mid RCA lesion is 100% stenosed.   Mid LAD lesion is 95% stenosed.   Prox LAD to Mid LAD lesion is 75% stenosed.   1st Diag lesion is 70% stenosed.   Prox Cx lesion is 90% stenosed.   1st Mrg lesion is 75% stenosed.   The left ventricular systolic function is normal.   LV end diastolic pressure is normal.   The left ventricular ejection fraction is 55-65% by visual estimate.   There is no aortic valve stenosis. Severe, complex, multivessel CAD. Plan for cardiac surgery consult.  Start high potency statin.  Add low dose beta blocker. Restart IV heparin 8 hours post sheath pull.      I have independently reviewed the above radiologic  studies and discussed with the patient   Recent Lab Findings: Lab Results  Component Value Date   WBC 9.5 08/24/2021   HGB 13.1 08/24/2021   HCT 39.3 08/24/2021   PLT 210 08/24/2021   GLUCOSE 143 (H) 08/23/2021   CHOL 193 07/10/2021   TRIG 115.0 07/10/2021   HDL 42.70 07/10/2021   LDLCALC 127 (H) 07/10/2021   ALT 16 10/23/2020   AST 15 10/23/2020   NA 137 08/23/2021   K 4.4 08/23/2021   CL 103 08/23/2021   CREATININE 0.95 08/23/2021   BUN 16 08/23/2021   CO2 25 08/23/2021   TSH 1.46 10/23/2020   HGBA1C 5.7 10/26/2020    Assessment / Plan:     CAD- multivessel CAD, has been chest pain free since hospitalization.  Requesting CABG HTN H/O Prostate Cancer with prostatectomy over 10 years ago 71- patient stable, currently remains chest pain free, patient appears to be good candidate for surgery.  Will discuss with Dr. [name] and follow up with further recommendations and timing of surgery.  First OR opening is Wednesday of next week.   I  spent 55 minutes counseling the patient face to face.  Kyle Handler, PA-C 08/24/2021 9:50 AM   Patient seen and examined, agree with history and physical as detailed above Presents with unstable coronary syndrome. Cath showed severe 3 vessel CAD. CABG indicated for survival benefit and relief of symptoms  I discussed the general nature of the procedure, including the need for general anesthesia, the use of cardiopulmonary bypass, the incisions to be used, the use of drainage tubes and pacing wires postoperatively with Mr and Mrs Seeley. We discussed the expected hospital stay, overall recovery and short and long term outcomes. I informed him of the indications, risks, benefits and alternatives.  They understand the risks include, but are not limited to death, stroke, MI, DVT/PE, bleeding, possible need for transfusion, infections, cardiac arrhythmias, as well as other organ system dysfunction including  respiratory, renal, or GI  complications.   He accepts the risks and agrees to proceed.   [name] [name], MD [facility] [phone]

## 2021-08-25 DIAGNOSIS — I214 Non-ST elevation (NSTEMI) myocardial infarction: Secondary | ICD-10-CM | POA: Diagnosis not present

## 2021-08-25 DIAGNOSIS — I10 Essential (primary) hypertension: Secondary | ICD-10-CM | POA: Diagnosis not present

## 2021-08-25 DIAGNOSIS — I2510 Atherosclerotic heart disease of native coronary artery without angina pectoris: Secondary | ICD-10-CM | POA: Diagnosis not present

## 2021-08-25 DIAGNOSIS — E7800 Pure hypercholesterolemia, unspecified: Secondary | ICD-10-CM | POA: Diagnosis not present

## 2021-08-25 DIAGNOSIS — I2583 Coronary atherosclerosis due to lipid rich plaque: Secondary | ICD-10-CM

## 2021-08-25 LAB — URINALYSIS, ROUTINE W REFLEX MICROSCOPIC
Bilirubin Urine: NEGATIVE
Glucose, UA: NEGATIVE mg/dL
Hgb urine dipstick: NEGATIVE
Ketones, ur: NEGATIVE mg/dL
Leukocytes,Ua: NEGATIVE
Nitrite: NEGATIVE
Protein, ur: NEGATIVE mg/dL
Specific Gravity, Urine: 1.021 (ref 1.005–1.030)
pH: 5 (ref 5.0–8.0)

## 2021-08-25 LAB — CBC
HCT: 39.8 % (ref 39.0–52.0)
Hemoglobin: 13.7 g/dL (ref 13.0–17.0)
MCH: 32.2 pg (ref 26.0–34.0)
MCHC: 34.4 g/dL (ref 30.0–36.0)
MCV: 93.6 fL (ref 80.0–100.0)
Platelets: 234 10*3/uL (ref 150–400)
RBC: 4.25 MIL/uL (ref 4.22–5.81)
RDW: 12.3 % (ref 11.5–15.5)
WBC: 9.8 10*3/uL (ref 4.0–10.5)
nRBC: 0 % (ref 0.0–0.2)

## 2021-08-25 LAB — SURGICAL PCR SCREEN
MRSA, PCR: NEGATIVE
Staphylococcus aureus: NEGATIVE

## 2021-08-25 LAB — HEPARIN LEVEL (UNFRACTIONATED): Heparin Unfractionated: 0.41 IU/mL (ref 0.30–0.70)

## 2021-08-25 NOTE — Progress Notes (Addendum)
Progress Note  Patient Name: Kyle Strickland Date of Encounter: 08/25/2021  [facility] Cardiologist: Larae Grooms, MD   Subjective   Denies any chest pain or shortness of breath  Inpatient Medications    Scheduled Meds:  aspirin EC  81 mg Oral Daily   atorvastatin  80 mg Oral Daily   influenza vaccine adjuvanted  0.5 mL Intramuscular Tomorrow-1000   loratadine  10 mg Oral Daily   metoprolol tartrate  25 mg Oral BID   sodium chloride flush  3 mL Intravenous Q12H   sodium chloride flush  3 mL Intravenous Q12H   Continuous Infusions:  sodium chloride     heparin 1,500 Units/hr (08/25/21 0641)   PRN Meds: sodium chloride, acetaminophen, guaiFENesin, nitroGLYCERIN, ondansetron (ZOFRAN) IV, sodium chloride flush   Vital Signs    Vitals:   08/23/21 2011 08/24/21 0514 08/24/21 2052 08/25/21 0458  BP: (!) 150/77 138/66 (!) 155/82 121/72  Pulse: 64 61 67 63  Resp: 19 15 16 18   Temp: 98.4 F (36.9 C) 98.3 F (36.8 C) 98.3 F (36.8 C) 98.2 F (36.8 C)  TempSrc: Oral Oral Oral Oral  SpO2: 98% 97% 97% 99%  Weight:      Height:        Intake/Output Summary (Last 24 hours) at 08/25/2021 0836 Last data filed at 08/25/2021 0700 Gross per 24 hour  Intake 184.5 ml  Output 3400 ml  Net -3215.5 ml    Last 3 Weights 08/23/2021 08/23/2021 07/10/2021  Weight (lbs) 212 lb 205 lb 212 lb  Weight (kg) 96.163 kg 92.987 kg 96.163 kg      Telemetry    Normal sinus rhythm- Personally Reviewed  ECG    Normal sinus rhythm with right bundle branch block - Personally Reviewed  Physical Exam   GEN: Well nourished, well developed in no acute distress HEENT: Normal NECK: No JVD; No carotid bruits LYMPHATICS: No lymphadenopathy CARDIAC:RRR, no murmurs, rubs, gallops RESPIRATORY:  Clear to auscultation without rales, wheezing or rhonchi  ABDOMEN: Soft, non-tender, non-distended MUSCULOSKELETAL:  No edema; No deformity  SKIN: Warm and dry NEUROLOGIC:  Alert and oriented  x 3 PSYCHIATRIC:  Normal affect   Labs    High Sensitivity Troponin:   Recent Labs  Lab 08/23/21 0949 08/23/21 1200  TROPONINIHS 381* 403*      Chemistry Recent Labs  Lab 08/23/21 0949  NA 137  K 4.4  CL 103  CO2 25  GLUCOSE 143*  BUN 16  CREATININE 0.95  CALCIUM 10.3  GFRNONAA >60  ANIONGAP 9     Lipids No results for input(s): CHOL, TRIG, HDL, LABVLDL, LDLCALC, CHOLHDL in the last 168 hours.  Hematology Recent Labs  Lab 08/23/21 0949 08/24/21 0335 08/25/21 0138  WBC 8.7 9.5 9.8  RBC 4.53 4.15* 4.25  HGB 14.6 13.1 13.7  HCT 42.3 39.3 39.8  MCV 93.4 94.7 93.6  MCH 32.2 31.6 32.2  MCHC 34.5 33.3 34.4  RDW 12.1 12.2 12.3  PLT 202 210 234    Thyroid No results for input(s): TSH, FREET4 in the last 168 hours.  BNPNo results for input(s): BNP, PROBNP in the last 168 hours.  DDimer No results for input(s): DDIMER in the last 168 hours.   Radiology    DG Chest 2 View  Result Date: 08/23/2021 CLINICAL DATA:  Chest pain. EXAM: CHEST - 2 VIEW COMPARISON:  No pertinent prior exams available for comparison. FINDINGS: Heart size within normal limits. Aortic atherosclerosis. Minimal atelectasis versus scarring within  the lateral left lung base. No appreciable airspace consolidation or pulmonary edema. No evidence of pleural effusion or pneumothorax. No acute bony abnormality identified. IMPRESSION: Minimal atelectasis versus scarring within the lateral left lung base. Otherwise, no evidence of acute cardiopulmonary abnormality. Aortic Atherosclerosis (ICD10-I70.0). Electronically Signed   By: Kellie Simmering D.O.   On: 08/23/2021 10:07   CARDIAC CATHETERIZATION  Result Date: 08/23/2021   Mid RCA lesion is 100% stenosed.   Mid LAD lesion is 95% stenosed.   Prox LAD to Mid LAD lesion is 75% stenosed.   1st Diag lesion is 70% stenosed.   Prox Cx lesion is 90% stenosed.   1st Mrg lesion is 75% stenosed.   The left ventricular systolic function is normal.   LV end diastolic  pressure is normal.   The left ventricular ejection fraction is 55-65% by visual estimate.   There is no aortic valve stenosis. Severe, complex, multivessel CAD. Plan for cardiac surgery consult.  Start high potency statin.  Add low dose beta blocker. Restart IV heparin 8 hours post sheath pull.    ECHOCARDIOGRAM COMPLETE  Result Date: 08/24/2021    ECHOCARDIOGRAM REPORT   Patient Name:   Kyle Strickland Date of Exam: 08/24/2021 Medical Rec #:  [id number]      Height:       71.0 in Accession #:    [id number]     Weight:       212.0 lb Date of Birth:  [birth year]     BSA:          2.161 m Patient Age:    75 years       BP:           150/77 mmHg Patient Gender: M              HR:           64 bpm. Exam Location:  Inpatient Procedure: 2D Echo, Cardiac Doppler and Color Doppler Indications:    R07.9* Chest pain, unspecified  History:        Patient has no prior history of Echocardiogram examinations.                 Acute MI; Signs/Symptoms:Chest Pain.  Sonographer:    Glo Herring Referring Phys: [place]  1. Left ventricular ejection fraction, by estimation, is 65 to 70%. The left ventricle has normal function. The left ventricle has no regional wall motion abnormalities. Left ventricular diastolic parameters were normal.  2. Right ventricular systolic function is normal. The right ventricular size is mildly enlarged.  3. The mitral valve is grossly normal. No evidence of mitral valve regurgitation.  4. The aortic valve is grossly normal. There is mild calcification of the aortic valve. Aortic valve regurgitation is mild. FINDINGS  Left Ventricle: Left ventricular ejection fraction, by estimation, is 65 to 70%. The left ventricle has normal function. The left ventricle has no regional wall motion abnormalities. The left ventricular internal cavity size was normal in size. There is  no left ventricular hypertrophy. Left ventricular diastolic parameters were normal. Right Ventricle: The  right ventricular size is mildly enlarged. Right vetricular wall thickness was not well visualized. Right ventricular systolic function is normal. Left Atrium: Left atrial size was normal in size. Right Atrium: Right atrial size was normal in size. Pericardium: There is no evidence of pericardial effusion. Mitral Valve: The mitral valve is grossly normal. No evidence of mitral valve regurgitation. Tricuspid Valve: The tricuspid valve  is normal in structure. Tricuspid valve regurgitation is mild. Aortic Valve: The aortic valve is grossly normal. There is mild calcification of the aortic valve. Aortic valve regurgitation is mild. Aortic valve mean gradient measures 4.0 mmHg. Aortic valve peak gradient measures 6.0 mmHg. Pulmonic Valve: The pulmonic valve was normal in structure. Pulmonic valve regurgitation is not visualized. Aorta: The aortic root and ascending aorta are structurally normal, with no evidence of dilitation. IAS/Shunts: The atrial septum is grossly normal.  LEFT VENTRICLE PLAX 2D LVIDd:         5.10 cm Diastology LVIDs:         3.30 cm LV e' medial:    5.98 cm/s LV PW:         1.20 cm LV E/e' medial:  9.8 LV IVS:        1.20 cm LV e' lateral:   12.00 cm/s                        LV E/e' lateral: 4.9  RIGHT VENTRICLE          IVC RV Basal diam:  3.50 cm  IVC diam: 1.80 cm LEFT ATRIUM             Index        RIGHT ATRIUM           Index LA diam:        3.80 cm 1.76 cm/m   RA Area:     12.10 cm LA Vol (A2C):   27.1 ml 12.54 ml/m  RA Volume:   23.70 ml  10.96 ml/m LA Vol (A4C):   43.2 ml 19.99 ml/m LA Biplane Vol: 35.8 ml 16.56 ml/m  AORTIC VALVE                   PULMONIC VALVE AV Vmax:           122.00 cm/s PV Vmax:       0.70 m/s AV Vmean:          90.300 cm/s PV Peak grad:  2.0 mmHg AV VTI:            0.275 m AV Peak Grad:      6.0 mmHg AV Mean Grad:      4.0 mmHg LVOT Vmax:         106.00 cm/s LVOT Vmean:        66.800 cm/s LVOT VTI:          0.202 m LVOT/AV VTI ratio: 0.73  AORTA Ao Root diam:  3.80 cm MITRAL VALVE MV Area (PHT): 2.78 cm    SHUNTS MV Decel Time: 273 msec    Systemic VTI: 0.20 m MV E velocity: 58.70 cm/s MV A velocity: 50.10 cm/s MV E/A ratio:  1.17 Mertie Moores MD Electronically signed by Mertie Moores MD Signature Date/Time: 08/24/2021/3:11:19 PM    Final     Cardiac Studies   Cath: 08/23/21  Mid RCA lesion is 100% stenosed.   Mid LAD lesion is 95% stenosed.   Prox LAD to Mid LAD lesion is 75% stenosed.   1st Diag lesion is 70% stenosed.   Prox Cx lesion is 90% stenosed.   1st Mrg lesion is 75% stenosed.   The left ventricular systolic function is normal.   LV end diastolic pressure is normal.   The left ventricular ejection fraction is 55-65% by visual estimate.   There is no aortic valve stenosis.   Severe,  complex, multivessel CAD.    Plan for cardiac surgery consult.     Start high potency statin.  Add low dose beta blocker. Restart IV heparin 8 hours post sheath pull.    Diagnostic Dominance: Co-dominant   Patient Profile     75 y.o. male no history of CAD or tobacco use who is being seen 08/23/2021 for the evaluation of chest pain to med [facility].   Assessment & Plan    NSTEMI: hsTn peaked 403, underwent cardiac cath noted above with 3v CAD.  -- CVTS has been consulted and felt to likely be a good CABG candidate awaiting final recommendations of timing of surgery  -- He is doing well today with no complaints -- 2D echo showed normal LV function with EF 65 to 70% with mild RV enlargement and mild AI -- Continue IV heparin, aspirin 81 mg daily, high-dose statin and Lopressor 12.5 mg twice daily   HLD:  -- LDL goal less than 70  -- LDL 127 (07/10/21) -- started on atorvastatin 80mg  daily -- Will need FLP and ALT in 6 to 8 weeks  HTN:  -- BP fairly well controlled  -- started on metoprolol 12.5mg  BID on admission, further titrated to 25mg  BID we will continue at this dose for now  For questions or updates, please contact [place] Please consult [email or web address] for contact info under   Signed, Fransico Him, MD  08/25/2021, 8:36 AM

## 2021-08-25 NOTE — Progress Notes (Signed)
ANTICOAGULATION CONSULT NOTE   Pharmacy Consult for heparin Indication: chest pain/ACS  Allergies  Allergen Reactions   Ciprofloxacin     Hives post op   Ditropan [Oxybutynin Chloride]     Hives 2011, post op   Codeine     Nausea and headaches     Patient Measurements: Height: 5\' 11"  (180.3 cm) Weight: 96.2 kg (212 lb) IBW/kg (Calculated) : 75.3 Heparin Dosing Weight: 93kg  Vital Signs: Temp: 98.2 F (36.8 C) (10/29 0458) Temp Source: Oral (10/29 0458) BP: 121/72 (10/29 0458) Pulse Rate: 63 (10/29 0458)  Labs: Recent Labs    08/23/21 0949 08/23/21 0949 08/23/21 1200 08/24/21 0335 08/24/21 0953 08/24/21 1938 08/25/21 0138  HGB 14.6  --   --  13.1  --   --  13.7  HCT 42.3  --   --  39.3  --   --  39.8  PLT 202  --   --  210  --   --  234  HEPARINUNFRC  --    < >  --  <0.10* 0.12* 0.42 0.41  CREATININE 0.95  --   --   --   --   --   --   TROPONINIHS 381*  --  403*  --   --   --   --    < > = values in this interval not displayed.     Estimated Creatinine Clearance: 79.5 mL/min (by C-G formula based on SCr of 0.95 mg/dL).   Medical History: Past Medical History:  Diagnosis Date   Elevated blood-pressure reading without diagnosis of hypertension    Other and unspecified hyperlipidemia    Prostate cancer ([place]) 2011   Vitamin D deficiency      Assessment: 84 YOM presenting with CP, elevated troponin, he is not on anticoagulation PTA, CBC wnl.  Pharmacy consulted for heparin infusion.  Heparin bolus and infusion started 10/27.  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  TCTS consulted and plan for a CABG sometime next week (next OR opening is Wednesday). Heparin remains at goal this morning (HL=0.41) on 1500 units/hr. CBC stable.   Patient and his wife were in good spirits when I went to check on his heparin infusion. No s/sx of bleeding or complications with the infusion noted. Pt does report that the tape attached to his infusion was making  him itch. I relayed this information to the RN.   Goal of Therapy:  Heparin level 0.3-0.7 units/ml Monitor platelets by anticoagulation protocol: Yes   Plan:  Continue heparin 1500 units/hr Monitor CBC, heparin level, and s/sx of bleeding daily  Thank you for allowing pharmacy to be part of this patients care team.  Pauletta Browns, Pharm.D. PGY-1 Pharmacy Resident [phone] 08/25/2021 8:29 AM

## 2021-08-26 ENCOUNTER — Inpatient Hospital Stay (HOSPITAL_COMMUNITY): Payer: Medicare Other

## 2021-08-26 DIAGNOSIS — Z01810 Encounter for preprocedural cardiovascular examination: Secondary | ICD-10-CM | POA: Diagnosis not present

## 2021-08-26 DIAGNOSIS — I10 Essential (primary) hypertension: Secondary | ICD-10-CM | POA: Diagnosis not present

## 2021-08-26 DIAGNOSIS — E7800 Pure hypercholesterolemia, unspecified: Secondary | ICD-10-CM | POA: Diagnosis not present

## 2021-08-26 DIAGNOSIS — I214 Non-ST elevation (NSTEMI) myocardial infarction: Secondary | ICD-10-CM | POA: Diagnosis not present

## 2021-08-26 DIAGNOSIS — I2510 Atherosclerotic heart disease of native coronary artery without angina pectoris: Secondary | ICD-10-CM | POA: Diagnosis not present

## 2021-08-26 DIAGNOSIS — E785 Hyperlipidemia, unspecified: Secondary | ICD-10-CM

## 2021-08-26 LAB — BASIC METABOLIC PANEL
Anion gap: 6 (ref 5–15)
BUN: 16 mg/dL (ref 8–23)
CO2: 24 mmol/L (ref 22–32)
Calcium: 9.6 mg/dL (ref 8.9–10.3)
Chloride: 106 mmol/L (ref 98–111)
Creatinine, Ser: 0.86 mg/dL (ref 0.61–1.24)
GFR, Estimated: >60 mL/min (ref >60)
Glucose, Bld: 102 mg/dL — ABNORMAL HIGH (ref 70–99)
Potassium: 4.2 mmol/L (ref 3.5–5.1)
Sodium: 136 mmol/L (ref 135–145)

## 2021-08-26 LAB — CBC
HCT: 39.3 % (ref 39.0–52.0)
Hemoglobin: 13.4 g/dL (ref 13.0–17.0)
MCH: 32.4 pg (ref 26.0–34.0)
MCHC: 34.1 g/dL (ref 30.0–36.0)
MCV: 95.2 fL (ref 80.0–100.0)
Platelets: 217 10*3/uL (ref 150–400)
RBC: 4.13 MIL/uL — ABNORMAL LOW (ref 4.22–5.81)
RDW: 12.2 % (ref 11.5–15.5)
WBC: 10.1 10*3/uL (ref 4.0–10.5)
nRBC: 0 % (ref 0.0–0.2)

## 2021-08-26 LAB — HEPARIN LEVEL (UNFRACTIONATED): Heparin Unfractionated: 0.49 IU/mL (ref 0.30–0.70)

## 2021-08-26 NOTE — Progress Notes (Signed)
Doing well and denies any chest pain or shortness of breath  Progress Note  Patient Name: Kyle Strickland Date of Encounter: 08/26/2021  [facility] Cardiologist: Larae Grooms, MD   Subjective   Denies any chest pain or shortness of breath  Inpatient Medications    Scheduled Meds:  aspirin EC  81 mg Oral Daily   atorvastatin  80 mg Oral Daily   influenza vaccine adjuvanted  0.5 mL Intramuscular Tomorrow-1000   loratadine  10 mg Oral Daily   metoprolol tartrate  25 mg Oral BID   sodium chloride flush  3 mL Intravenous Q12H   sodium chloride flush  3 mL Intravenous Q12H   Continuous Infusions:  sodium chloride     heparin 1,500 Units/hr (08/26/21 0906)   PRN Meds: sodium chloride, acetaminophen, guaiFENesin, nitroGLYCERIN, ondansetron (ZOFRAN) IV, sodium chloride flush   Vital Signs    Vitals:   08/25/21 2100 08/25/21 2238 08/26/21 0544 08/26/21 0903  BP: 133/75 133/75 123/72 121/63  Pulse: 64 68 63 62  Resp: 19  18 18   Temp: 97.8 F (36.6 C)  97.8 F (36.6 C) 97.6 F (36.4 C)  TempSrc: Oral  Oral Oral  SpO2: 99%  99%   Weight:      Height:        Intake/Output Summary (Last 24 hours) at 08/26/2021 0931 Last data filed at 08/26/2021 4403 Gross per 24 hour  Intake 974.33 ml  Output 1725 ml  Net -750.67 ml    Last 3 Weights 08/23/2021 08/23/2021 07/10/2021  Weight (lbs) 212 lb 205 lb 212 lb  Weight (kg) 96.163 kg 92.987 kg 96.163 kg      Telemetry    Normal sinus rhythm personally Reviewed  ECG  No new EKG to review- Personally Reviewed  Physical Exam   GEN: Well nourished, well developed in no acute distress HEENT: Normal NECK: No JVD; No carotid bruits LYMPHATICS: No lymphadenopathy CARDIAC:RRR, no murmurs, rubs, gallops RESPIRATORY:  Clear to auscultation without rales, wheezing or rhonchi  ABDOMEN: Soft, non-tender, non-distended MUSCULOSKELETAL:  No edema; No deformity  SKIN: Warm and dry NEUROLOGIC:  Alert and oriented x  3 PSYCHIATRIC:  Normal affect   Labs    High Sensitivity Troponin:   Recent Labs  Lab 08/23/21 0949 08/23/21 1200  TROPONINIHS 381* 403*      Chemistry Recent Labs  Lab 08/23/21 0949  NA 137  K 4.4  CL 103  CO2 25  GLUCOSE 143*  BUN 16  CREATININE 0.95  CALCIUM 10.3  GFRNONAA >60  ANIONGAP 9     Lipids No results for input(s): CHOL, TRIG, HDL, LABVLDL, LDLCALC, CHOLHDL in the last 168 hours.  Hematology Recent Labs  Lab 08/24/21 0335 08/25/21 0138 08/26/21 0316  WBC 9.5 9.8 10.1  RBC 4.15* 4.25 4.13*  HGB 13.1 13.7 13.4  HCT 39.3 39.8 39.3  MCV 94.7 93.6 95.2  MCH 31.6 32.2 32.4  MCHC 33.3 34.4 34.1  RDW 12.2 12.3 12.2  PLT 210 234 217    Thyroid No results for input(s): TSH, FREET4 in the last 168 hours.  BNPNo results for input(s): BNP, PROBNP in the last 168 hours.  DDimer No results for input(s): DDIMER in the last 168 hours.   Radiology    ECHOCARDIOGRAM COMPLETE  Result Date: 08/24/2021    ECHOCARDIOGRAM REPORT   Patient Name:   Kyle NGHIEM Date of Exam: 08/24/2021 Medical Rec #:  [id number]      Height:  71.0 in Accession #:    [id number]     Weight:       212.0 lb Date of Birth:  13-Jun-1946     BSA:          2.161 m Patient Age:    75 years       BP:           150/77 mmHg Patient Gender: M              HR:           64 bpm. Exam Location:  Inpatient Procedure: 2D Echo, Cardiac Doppler and Color Doppler Indications:    R07.9* Chest pain, unspecified  History:        Patient has no prior history of Echocardiogram examinations.                 Acute MI; Signs/Symptoms:Chest Pain.  Sonographer:    Glo Herring Referring Phys: [place]  1. Left ventricular ejection fraction, by estimation, is 65 to 70%. The left ventricle has normal function. The left ventricle has no regional wall motion abnormalities. Left ventricular diastolic parameters were normal.  2. Right ventricular systolic function is normal. The right  ventricular size is mildly enlarged.  3. The mitral valve is grossly normal. No evidence of mitral valve regurgitation.  4. The aortic valve is grossly normal. There is mild calcification of the aortic valve. Aortic valve regurgitation is mild. FINDINGS  Left Ventricle: Left ventricular ejection fraction, by estimation, is 65 to 70%. The left ventricle has normal function. The left ventricle has no regional wall motion abnormalities. The left ventricular internal cavity size was normal in size. There is  no left ventricular hypertrophy. Left ventricular diastolic parameters were normal. Right Ventricle: The right ventricular size is mildly enlarged. Right vetricular wall thickness was not well visualized. Right ventricular systolic function is normal. Left Atrium: Left atrial size was normal in size. Right Atrium: Right atrial size was normal in size. Pericardium: There is no evidence of pericardial effusion. Mitral Valve: The mitral valve is grossly normal. No evidence of mitral valve regurgitation. Tricuspid Valve: The tricuspid valve is normal in structure. Tricuspid valve regurgitation is mild. Aortic Valve: The aortic valve is grossly normal. There is mild calcification of the aortic valve. Aortic valve regurgitation is mild. Aortic valve mean gradient measures 4.0 mmHg. Aortic valve peak gradient measures 6.0 mmHg. Pulmonic Valve: The pulmonic valve was normal in structure. Pulmonic valve regurgitation is not visualized. Aorta: The aortic root and ascending aorta are structurally normal, with no evidence of dilitation. IAS/Shunts: The atrial septum is grossly normal.  LEFT VENTRICLE PLAX 2D LVIDd:         5.10 cm Diastology LVIDs:         3.30 cm LV e' medial:    5.98 cm/s LV PW:         1.20 cm LV E/e' medial:  9.8 LV IVS:        1.20 cm LV e' lateral:   12.00 cm/s                        LV E/e' lateral: 4.9  RIGHT VENTRICLE          IVC RV Basal diam:  3.50 cm  IVC diam: 1.80 cm LEFT ATRIUM             Index         RIGHT ATRIUM  Index LA diam:        3.80 cm 1.76 cm/m   RA Area:     12.10 cm LA Vol (A2C):   27.1 ml 12.54 ml/m  RA Volume:   23.70 ml  10.96 ml/m LA Vol (A4C):   43.2 ml 19.99 ml/m LA Biplane Vol: 35.8 ml 16.56 ml/m  AORTIC VALVE                   PULMONIC VALVE AV Vmax:           122.00 cm/s PV Vmax:       0.70 m/s AV Vmean:          90.300 cm/s PV Peak grad:  2.0 mmHg AV VTI:            0.275 m AV Peak Grad:      6.0 mmHg AV Mean Grad:      4.0 mmHg LVOT Vmax:         106.00 cm/s LVOT Vmean:        66.800 cm/s LVOT VTI:          0.202 m LVOT/AV VTI ratio: 0.73  AORTA Ao Root diam: 3.80 cm MITRAL VALVE MV Area (PHT): 2.78 cm    SHUNTS MV Decel Time: 273 msec    Systemic VTI: 0.20 m MV E velocity: 58.70 cm/s MV A velocity: 50.10 cm/s MV E/A ratio:  1.17 Mertie Moores MD Electronically signed by Mertie Moores MD Signature Date/Time: 08/24/2021/3:11:19 PM    Final     Cardiac Studies   Cath: 08/23/21  Mid RCA lesion is 100% stenosed.   Mid LAD lesion is 95% stenosed.   Prox LAD to Mid LAD lesion is 75% stenosed.   1st Diag lesion is 70% stenosed.   Prox Cx lesion is 90% stenosed.   1st Mrg lesion is 75% stenosed.   The left ventricular systolic function is normal.   LV end diastolic pressure is normal.   The left ventricular ejection fraction is 55-65% by visual estimate.   There is no aortic valve stenosis.   Severe, complex, multivessel CAD.    Plan for cardiac surgery consult.     Start high potency statin.  Add low dose beta blocker. Restart IV heparin 8 hours post sheath pull.    Diagnostic Dominance: Co-dominant   Patient Profile     75 y.o. male no history of CAD or tobacco use who is being seen 08/23/2021 for the evaluation of chest pain to med [facility].   Assessment & Plan    NSTEMI: hsTn peaked 403, underwent cardiac cath noted above with 3v CAD.  -- CVTS has been consulted and plan for CABG on Wednesday -- He denies any chest pain -- 2D  echo showed normal LV function with EF 65 to 70% with mild RV enlargement and mild AI -- Continue IV heparin, aspirin 81 mg daily, high-dose statin and Lopressor 25 mg twice daily   HLD:  -- LDL goal less than 70  -- LDL 127 (07/10/21) -- started on atorvastatin 80mg  daily -- Will need FLP and ALT in 6 to 8 weeks  HTN:  --BP well controlled today --Continue metoprolol 25 mg twice daily   For questions or updates, please contact [facility] Please consult [email or web address] for contact info under   Signed, Fransico Him, MD  08/26/2021, 9:31 AM

## 2021-08-26 NOTE — Progress Notes (Signed)
ANTICOAGULATION CONSULT NOTE   Pharmacy Consult for heparin Indication: chest pain/ACS  Allergies  Allergen Reactions   Ciprofloxacin     Hives post op   Ditropan [Oxybutynin Chloride]     Hives 2011, post op   Codeine     Nausea and headaches     Patient Measurements: Height: 5\' 11"  (180.3 cm) Weight: 96.2 kg (212 lb) IBW/kg (Calculated) : 75.3 Heparin Dosing Weight: 93kg  Vital Signs: Temp: 97.8 F (36.6 C) (10/30 0544) Temp Source: Oral (10/30 0544) BP: 123/72 (10/30 0544) Pulse Rate: 63 (10/30 0544)  Labs: Recent Labs    08/23/21 0949 08/23/21 1200 08/24/21 0335 08/24/21 0953 08/24/21 1938 08/25/21 0138 08/26/21 0316  HGB 14.6  --  13.1  --   --  13.7 13.4  HCT 42.3  --  39.3  --   --  39.8 39.3  PLT 202  --  210  --   --  234 217  HEPARINUNFRC  --   --  <0.10*   < > 0.42 0.41 0.49  CREATININE 0.95  --   --   --   --   --   --   TROPONINIHS 381* 403*  --   --   --   --   --    < > = values in this interval not displayed.     Estimated Creatinine Clearance: 79.5 mL/min (by C-G formula based on SCr of 0.95 mg/dL).   Medical History: Past Medical History:  Diagnosis Date   Elevated blood-pressure reading without diagnosis of hypertension    Other and unspecified hyperlipidemia    Prostate cancer ([place]) 2011   Vitamin D deficiency      Assessment: 3 YOM presenting with CP, elevated troponin, he is not on anticoagulation PTA, CBC wnl.  Pharmacy consulted for heparin infusion.  Heparin bolus and infusion started 10/27.Plan for a CABG sometime next week (next OR opening is Wednesday).   Heparin remains therapeutic this morning at 0.49 on 1500 units/hr. CBC stable. Patient awake and ambulating upon entering the room. No s/sx of bleeding noted.   Goal of Therapy:  Heparin level 0.3-0.7 units/ml Monitor platelets by anticoagulation protocol: Yes   Plan:  Continue heparin 1500 units/hr Monitor CBC, heparin level, and s/sx of bleeding daily  Thank you  for allowing pharmacy to be part of this patients care team.  Pauletta Browns, Pharm.D. PGY-1 Pharmacy Resident [phone] 08/26/2021 8:23 AM

## 2021-08-26 NOTE — Progress Notes (Signed)
Pre-CABG study completed.  ° °Please see CV Proc for preliminary results.  ° °Takera Rayl, RDMS, RVT ° °

## 2021-08-27 DIAGNOSIS — I214 Non-ST elevation (NSTEMI) myocardial infarction: Secondary | ICD-10-CM | POA: Diagnosis not present

## 2021-08-27 LAB — PROTIME-INR
INR: 1 (ref 0.8–1.2)
Prothrombin Time: 13.1 seconds (ref 11.4–15.2)

## 2021-08-27 LAB — CBC
HCT: 39 % (ref 39.0–52.0)
Hemoglobin: 13 g/dL (ref 13.0–17.0)
MCH: 31.5 pg (ref 26.0–34.0)
MCHC: 33.3 g/dL (ref 30.0–36.0)
MCV: 94.4 fL (ref 80.0–100.0)
Platelets: 220 10*3/uL (ref 150–400)
RBC: 4.13 MIL/uL — ABNORMAL LOW (ref 4.22–5.81)
RDW: 12.3 % (ref 11.5–15.5)
WBC: 9.2 10*3/uL (ref 4.0–10.5)
nRBC: 0 % (ref 0.0–0.2)

## 2021-08-27 LAB — HEMOGLOBIN A1C
Hgb A1c MFr Bld: 5.6 % (ref 4.8–5.6)
Mean Plasma Glucose: 114.02 mg/dL

## 2021-08-27 LAB — HEPARIN LEVEL (UNFRACTIONATED): Heparin Unfractionated: 0.3 IU/mL (ref 0.30–0.70)

## 2021-08-27 LAB — APTT: aPTT: 46 seconds — ABNORMAL HIGH (ref 24–36)

## 2021-08-27 NOTE — Progress Notes (Addendum)
The patient has been seen in conjunction with Vin Bhagat, PAC. All aspects of care have been considered and discussed. The patient has been personally interviewed, examined, and all clinical data has been reviewed.  Stable and awaiting CABG later this week.   Progress Note  Patient Name: Kyle Strickland Date of Encounter: 08/27/2021  [facility] Cardiologist: Larae Grooms, MD   Subjective   Feeling well. No chest pain, sob or palpitations.    Inpatient Medications    Scheduled Meds:  aspirin EC  81 mg Oral Daily   atorvastatin  80 mg Oral Daily   influenza vaccine adjuvanted  0.5 mL Intramuscular Tomorrow-1000   loratadine  10 mg Oral Daily   metoprolol tartrate  25 mg Oral BID   sodium chloride flush  3 mL Intravenous Q12H   sodium chloride flush  3 mL Intravenous Q12H   Continuous Infusions:  sodium chloride     heparin 1,500 Units/hr (08/26/21 1807)   PRN Meds: sodium chloride, acetaminophen, guaiFENesin, nitroGLYCERIN, ondansetron (ZOFRAN) IV, sodium chloride flush   Vital Signs    Vitals:   08/26/21 0903 08/26/21 1620 08/26/21 2024 08/27/21 0613  BP: 121/63 114/68 137/71 121/70  Pulse: 62 64 68 (!) 58  Resp: 18 18 18 18   Temp: 97.6 F (36.4 C) 97.6 F (36.4 C) 98.3 F (36.8 C) 98.5 F (36.9 C)  TempSrc: Oral Oral Axillary Oral  SpO2:  98% 93% 97%  Weight:      Height:        Intake/Output Summary (Last 24 hours) at 08/27/2021 0730 Last data filed at 08/27/2021 0721 Gross per 24 hour  Intake 2005.15 ml  Output 2350 ml  Net -344.85 ml   Last 3 Weights 08/23/2021 08/23/2021 07/10/2021  Weight (lbs) 212 lb 205 lb 212 lb  Weight (kg) 96.163 kg 92.987 kg 96.163 kg      Telemetry    Sinus bradycardia in 50s - Personally Reviewed  ECG    N/A  Physical Exam   GEN: No acute distress.   Neck: No JVD Cardiac: RRR, no murmurs, rubs, or gallops.  Respiratory: Clear to auscultation bilaterally. GI: Soft, nontender, non-distended  MS: No  edema; No deformity. Neuro:  Nonfocal  Psych: Normal affect   Labs    High Sensitivity Troponin:   Recent Labs  Lab 08/23/21 0949 08/23/21 1200  TROPONINIHS 381* 403*     Chemistry Recent Labs  Lab 08/23/21 0949 08/26/21 0944  NA 137 136  K 4.4 4.2  CL 103 106  CO2 25 24  GLUCOSE 143* 102*  BUN 16 16  CREATININE 0.95 0.86  CALCIUM 10.3 9.6  GFRNONAA >60 >60  ANIONGAP 9 6     Hematology Recent Labs  Lab 08/25/21 0138 08/26/21 0316 08/27/21 0233  WBC 9.8 10.1 9.2  RBC 4.25 4.13* 4.13*  HGB 13.7 13.4 13.0  HCT 39.8 39.3 39.0  MCV 93.6 95.2 94.4  MCH 32.2 32.4 31.5  MCHC 34.4 34.1 33.3  RDW 12.3 12.2 12.3  PLT 234 217 220    Radiology    VAS US DOPPLER PRE CABG  Result Date: 08/26/2021 PREOPERATIVE VASCULAR EVALUATION Patient Name:  SANDY BLOUCH  Date of Exam:   08/26/2021 Medical Rec #: [id number]       Accession #:    [id number] Date of Birth: 07/03/1946      Patient Gender: M Patient Age:   58 years Exam Location:  [facility] Procedure:  VAS US DOPPLER PRE CABG Referring Phys: HARRELL LIGHTFOOT --------------------------------------------------------------------------------  Indications:      Pre-CABG. Risk Factors:     Hyperlipidemia, prior MI, coronary artery disease. Other Factors:    Patient is left handed. Comparison Study: No prior studies. Performing Technologist: Darlin Coco RDMS, RVT  Examination Guidelines: A complete evaluation includes B-mode imaging, spectral Doppler, color Doppler, and power Doppler as needed of all accessible portions of each vessel. Bilateral testing is considered an integral part of a complete examination. Limited examinations for reoccurring indications may be performed as noted.  Right Carotid Findings: +----------+--------+--------+--------+------------------+------------------+           PSV cm/sEDV cm/sStenosisDescribe          Comments            +----------+--------+--------+--------+------------------+------------------+ CCA Prox  140     15                                tortuous           +----------+--------+--------+--------+------------------+------------------+ CCA Distal120     21                                intimal thickening +----------+--------+--------+--------+------------------+------------------+ ICA Prox  71      18              heterogenous, mild                   +----------+--------+--------+--------+------------------+------------------+ ICA Distal62      18                                                   +----------+--------+--------+--------+------------------+------------------+ ECA       123     15                                tortuous           +----------+--------+--------+--------+------------------+------------------+ +----------+--------+-------+----------------+------------+           PSV cm/sEDV cmsDescribe        Arm Pressure +----------+--------+-------+----------------+------------+ Subclavian151            Multiphasic, WNL             +----------+--------+-------+----------------+------------+ +---------+--------+--+--------+--+---------+ VertebralPSV cm/s60EDV cm/s13Antegrade +---------+--------+--+--------+--+---------+ Left Carotid Findings: +----------+--------+--------+--------+------------------+------------------+           PSV cm/sEDV cm/sStenosisDescribe          Comments           +----------+--------+--------+--------+------------------+------------------+ CCA Prox  170     20                                                   +----------+--------+--------+--------+------------------+------------------+ CCA Distal103     17                                intimal thickening +----------+--------+--------+--------+------------------+------------------+ ICA Prox  63      16  heterogenous, mild                    +----------+--------+--------+--------+------------------+------------------+ ICA Distal80      21                                                   +----------+--------+--------+--------+------------------+------------------+ ECA       119     12                                tortuous           +----------+--------+--------+--------+------------------+------------------+ +----------+--------+--------+----------------+------------+ SubclavianPSV cm/sEDV cm/sDescribe        Arm Pressure +----------+--------+--------+----------------+------------+           158             Multiphasic, WNL             +----------+--------+--------+----------------+------------+ +---------+--------+--+--------+--+---------+ VertebralPSV cm/s68EDV cm/s16Antegrade +---------+--------+--+--------+--+---------+  ABI Findings: +--------+------------------+-----+---------+--------+ Right   Rt Pressure (mmHg)IndexWaveform Comment  +--------+------------------+-----+---------+--------+ Brachial                       triphasic         +--------+------------------+-----+---------+--------+ PTA                            triphasic         +--------+------------------+-----+---------+--------+ DP                             triphasic         +--------+------------------+-----+---------+--------+ +--------+------------------+-----+---------+-------+ Left    Lt Pressure (mmHg)IndexWaveform Comment +--------+------------------+-----+---------+-------+ Brachial                       triphasic        +--------+------------------+-----+---------+-------+ PTA                            triphasic        +--------+------------------+-----+---------+-------+ DP                             triphasic        +--------+------------------+-----+---------+-------+  Right Doppler Findings: +--------+--------+-----+---------+--------+ Site    PressureIndexDoppler  Comments  +--------+--------+-----+---------+--------+ Brachial             triphasic         +--------+--------+-----+---------+--------+ Radial               triphasic         +--------+--------+-----+---------+--------+ Ulnar                triphasic         +--------+--------+-----+---------+--------+  Left Doppler Findings: +--------+--------+-----+---------+--------+ Site    PressureIndexDoppler  Comments +--------+--------+-----+---------+--------+ Brachial             triphasic         +--------+--------+-----+---------+--------+ Radial               triphasic         +--------+--------+-----+---------+--------+ Ulnar                triphasic         +--------+--------+-----+---------+--------+  Summary: Right Carotid: The extracranial vessels were near-normal with only minimal wall                thickening or plaque. Left Carotid: The extracranial vessels were near-normal with only minimal wall               thickening or plaque. Vertebrals:  Bilateral vertebral arteries demonstrate antegrade flow. Subclavians: Normal flow hemodynamics were seen in bilateral subclavian              arteries. Right Upper Extremity: Doppler waveforms remain within normal limits with right radial compression. Doppler waveforms remain within normal limits with right ulnar compression. Left Upper Extremity: Doppler waveforms remain within normal limits with left radial compression. Doppler waveforms remain within normal limits with left ulnar compression.    Preliminary     Cardiac Studies   Echo 08/24/21  1. Left ventricular ejection fraction, by estimation, is 65 to 70%. The  left ventricle has normal function. The left ventricle has no regional  wall motion abnormalities. Left ventricular diastolic parameters were  normal.   2. Right ventricular systolic function is normal. The right ventricular  size is mildly enlarged.   3. The mitral valve is grossly normal. No evidence of mitral  valve  regurgitation.   4. The aortic valve is grossly normal. There is mild calcification of the  aortic valve. Aortic valve regurgitation is mild.   Cath: 08/23/21   Mid RCA lesion is 100% stenosed.   Mid LAD lesion is 95% stenosed.   Prox LAD to Mid LAD lesion is 75% stenosed.   1st Diag lesion is 70% stenosed.   Prox Cx lesion is 90% stenosed.   1st Mrg lesion is 75% stenosed.   The left ventricular systolic function is normal.   LV end diastolic pressure is normal.   The left ventricular ejection fraction is 55-65% by visual estimate.   There is no aortic valve stenosis.   Severe, complex, multivessel CAD.    Plan for cardiac surgery consult.     Start high potency statin.  Add low dose beta blocker. Restart IV heparin 8 hours post sheath pull.     Diagnostic Dominance: Co-dominant    Patient Profile     75 y.o. male with no PMH presented to [facility] for chest pain evaluation.   Assessment & Plan    NSTEMI:  -- hsTn peaked 403, underwent cardiac cath noted above with 3v CAD.  -- CVTS has been consulted and plan for CABG on Wednesday -- No recurrent chest pain -- 2D echo showed normal LV function with EF 65 to 70% with mild RV enlargement and mild AI -- Continue IV heparin, aspirin 81 mg daily, high-dose statin and Lopressor 25 mg twice daily (will not titrate further due to HR in 50s)   2. HLD:  -- LDL goal less than 70  -- 07/10/2021: Cholesterol 193; HDL 42.70; LDL Cholesterol 127; Triglycerides 115.0; VLDL 23.0  -- started on atorvastatin 80mg  daily -- Will need FLP and ALT in 6 to 8 weeks   3. HTN:  --BP well controlled today --Continue metoprolol 25 mg twice daily   For questions or updates, please contact [facility] Please consult [email or web address] for contact info under        Signed, Leanor Kail, PA  08/27/2021, 7:30 AM

## 2021-08-27 NOTE — Progress Notes (Signed)
CARDIAC REHAB PHASE I   Pt has ambulated 15 min, practicing IS (2000-2500 ml), read OHS book, and watched preop video. Discussed sternal precautions, mobility post op and d/c planning. Pt receptive. Will continue to ambulate. Wife will be with him at d/c (retired Therapist, sports). [id number]  [place], ACSM 08/27/2021 10:39 AM

## 2021-08-27 NOTE — Care Management Important Message (Signed)
Important Message  Patient Details  Name: Kyle Strickland MRN: [id number] Date of Birth: Nov 19, 1945   Medicare Important Message Given:  Yes     Shelda Altes 08/27/2021, 12:04 PM

## 2021-08-27 NOTE — Progress Notes (Signed)
ANTICOAGULATION CONSULT NOTE   Pharmacy Consult for heparin Indication: chest pain/ACS  Allergies  Allergen Reactions   Ciprofloxacin     Hives post op   Ditropan [Oxybutynin Chloride]     Hives 2011, post op   Codeine     Nausea and headaches     Patient Measurements: Height: 5\' 11"  (180.3 cm) Weight: 96.2 kg (212 lb) IBW/kg (Calculated) : 75.3 Heparin Dosing Weight: 93kg  Vital Signs: Temp: 98.5 F (36.9 C) (10/31 0613) Temp Source: Oral (10/31 6578) BP: 121/70 (10/31 4696) Pulse Rate: 58 (10/31 0613)  Labs: Recent Labs    08/25/21 0138 08/26/21 0316 08/26/21 0944 08/27/21 0233  HGB 13.7 13.4  --  13.0  HCT 39.8 39.3  --  39.0  PLT 234 217  --  220  APTT  --   --   --  46*  LABPROT  --   --   --  13.1  INR  --   --   --  1.0  HEPARINUNFRC 0.41 0.49  --  0.30  CREATININE  --   --  0.86  --      Estimated Creatinine Clearance: 87.9 mL/min (by C-G formula based on SCr of 0.86 mg/dL).   Medical History: Past Medical History:  Diagnosis Date   Elevated blood-pressure reading without diagnosis of hypertension    Other and unspecified hyperlipidemia    Prostate cancer ([place]) 2011   Vitamin D deficiency      Assessment: 29 YOM presenting with CP, elevated troponin, he is not on anticoagulation PTA, CBC wnl. Pt s/p LHC w/ mvCAD, planning for coronary bypass. Heparin resumed postcath.  Heparin level therapeutic at 0.30 on lower end of goal range, CBC stable.  Goal of Therapy:  Heparin level 0.3-0.7 units/ml Monitor platelets by anticoagulation protocol: Yes   Plan:  Increase heparin to 1600 units/h Recheck heparin level with daily labs  Arrie Senate, PharmD, BCPS, [facility] Clinical Pharmacist [phone] Please check AMION for all [facility] Pharmacy numbers 08/27/2021

## 2021-08-28 DIAGNOSIS — I214 Non-ST elevation (NSTEMI) myocardial infarction: Secondary | ICD-10-CM | POA: Diagnosis not present

## 2021-08-28 DIAGNOSIS — I25110 Atherosclerotic heart disease of native coronary artery with unstable angina pectoris: Secondary | ICD-10-CM | POA: Diagnosis not present

## 2021-08-28 DIAGNOSIS — E782 Mixed hyperlipidemia: Secondary | ICD-10-CM | POA: Diagnosis not present

## 2021-08-28 DIAGNOSIS — I2510 Atherosclerotic heart disease of native coronary artery without angina pectoris: Secondary | ICD-10-CM | POA: Diagnosis not present

## 2021-08-28 LAB — CBC
HCT: 37.9 % — ABNORMAL LOW (ref 39.0–52.0)
Hemoglobin: 12.9 g/dL — ABNORMAL LOW (ref 13.0–17.0)
MCH: 32 pg (ref 26.0–34.0)
MCHC: 34 g/dL (ref 30.0–36.0)
MCV: 94 fL (ref 80.0–100.0)
Platelets: 208 10*3/uL (ref 150–400)
RBC: 4.03 MIL/uL — ABNORMAL LOW (ref 4.22–5.81)
RDW: 12.3 % (ref 11.5–15.5)
WBC: 9.6 10*3/uL (ref 4.0–10.5)
nRBC: 0 % (ref 0.0–0.2)

## 2021-08-28 LAB — BASIC METABOLIC PANEL
Anion gap: 7 (ref 5–15)
BUN: 16 mg/dL (ref 8–23)
CO2: 24 mmol/L (ref 22–32)
Calcium: 9.7 mg/dL (ref 8.9–10.3)
Chloride: 106 mmol/L (ref 98–111)
Creatinine, Ser: 1.08 mg/dL (ref 0.61–1.24)
GFR, Estimated: >60 mL/min (ref >60)
Glucose, Bld: 121 mg/dL — ABNORMAL HIGH (ref 70–99)
Potassium: 4.1 mmol/L (ref 3.5–5.1)
Sodium: 137 mmol/L (ref 135–145)

## 2021-08-28 LAB — ABO/RH: ABO/RH(D): O POS

## 2021-08-28 LAB — TYPE AND SCREEN
ABO/RH(D): O POS
Antibody Screen: NEGATIVE

## 2021-08-28 LAB — HEPARIN LEVEL (UNFRACTIONATED): Heparin Unfractionated: 0.53 IU/mL (ref 0.30–0.70)

## 2021-08-28 MED ORDER — CHLORHEXIDINE GLUCONATE 0.12 % MT SOLN
15.0000 mL | Freq: Once | OROMUCOSAL | Status: AC
  Administered 2021-08-29: 15 mL via OROMUCOSAL
  Filled 2021-08-28: qty 15

## 2021-08-28 MED ORDER — DEXMEDETOMIDINE HCL IN NACL 400 MCG/100ML IV SOLN
0.1000 ug/kg/h | INTRAVENOUS | Status: AC
  Administered 2021-08-29: .7 ug/kg/h via INTRAVENOUS
  Filled 2021-08-28: qty 100

## 2021-08-28 MED ORDER — VANCOMYCIN HCL 1500 MG/300ML IV SOLN
1500.0000 mg | INTRAVENOUS | Status: AC
  Administered 2021-08-29: 1500 mg via INTRAVENOUS
  Filled 2021-08-28: qty 300

## 2021-08-28 MED ORDER — BISACODYL 5 MG PO TBEC
5.0000 mg | DELAYED_RELEASE_TABLET | Freq: Once | ORAL | Status: AC
  Administered 2021-08-28: 5 mg via ORAL
  Filled 2021-08-28: qty 1

## 2021-08-28 MED ORDER — EPINEPHRINE HCL 5 MG/250ML IV SOLN IN NS
0.0000 ug/min | INTRAVENOUS | Status: DC
  Filled 2021-08-28: qty 250

## 2021-08-28 MED ORDER — HEPARIN 30,000 UNITS/1000 ML (OHS) CELLSAVER SOLUTION
Status: DC
  Filled 2021-08-28: qty 1000

## 2021-08-28 MED ORDER — TRANEXAMIC ACID 1000 MG/10ML IV SOLN
1.5000 mg/kg/h | INTRAVENOUS | Status: AC
  Administered 2021-08-29: 1.5 mg/kg/h via INTRAVENOUS
  Filled 2021-08-28: qty 25

## 2021-08-28 MED ORDER — TRANEXAMIC ACID (OHS) BOLUS VIA INFUSION
15.0000 mg/kg | INTRAVENOUS | Status: AC
  Administered 2021-08-29: 1443 mg via INTRAVENOUS
  Filled 2021-08-28: qty 1443

## 2021-08-28 MED ORDER — PLASMA-LYTE A IV SOLN
INTRAVENOUS | Status: DC
  Filled 2021-08-28: qty 5

## 2021-08-28 MED ORDER — METOPROLOL TARTRATE 12.5 MG HALF TABLET
12.5000 mg | ORAL_TABLET | Freq: Once | ORAL | Status: AC
  Administered 2021-08-29: 12.5 mg via ORAL
  Filled 2021-08-28: qty 1

## 2021-08-28 MED ORDER — NOREPINEPHRINE 4 MG/250ML-% IV SOLN
0.0000 ug/min | INTRAVENOUS | Status: DC
  Filled 2021-08-28: qty 250

## 2021-08-28 MED ORDER — MAGNESIUM SULFATE 50 % IJ SOLN
40.0000 meq | INTRAMUSCULAR | Status: DC
  Filled 2021-08-28: qty 9.85

## 2021-08-28 MED ORDER — MILRINONE LACTATE IN DEXTROSE 20-5 MG/100ML-% IV SOLN
0.3000 ug/kg/min | INTRAVENOUS | Status: DC
  Filled 2021-08-28: qty 100

## 2021-08-28 MED ORDER — DIAZEPAM 2 MG PO TABS
2.0000 mg | ORAL_TABLET | Freq: Once | ORAL | Status: AC
  Administered 2021-08-29: 2 mg via ORAL
  Filled 2021-08-28: qty 1

## 2021-08-28 MED ORDER — CHLORHEXIDINE GLUCONATE CLOTH 2 % EX PADS
6.0000 | MEDICATED_PAD | Freq: Once | CUTANEOUS | Status: AC
  Administered 2021-08-28: 6 via TOPICAL

## 2021-08-28 MED ORDER — INSULIN REGULAR(HUMAN) IN NACL 100-0.9 UT/100ML-% IV SOLN
INTRAVENOUS | Status: AC
  Administered 2021-08-29: 1.6 [IU]/h via INTRAVENOUS
  Filled 2021-08-28: qty 100

## 2021-08-28 MED ORDER — CEFAZOLIN SODIUM-DEXTROSE 2-4 GM/100ML-% IV SOLN
2.0000 g | INTRAVENOUS | Status: DC
  Filled 2021-08-28: qty 100

## 2021-08-28 MED ORDER — CHLORHEXIDINE GLUCONATE CLOTH 2 % EX PADS
6.0000 | MEDICATED_PAD | Freq: Once | CUTANEOUS | Status: AC
  Administered 2021-08-29: 6 via TOPICAL

## 2021-08-28 MED ORDER — TRANEXAMIC ACID (OHS) PUMP PRIME SOLUTION
2.0000 mg/kg | INTRAVENOUS | Status: DC
  Filled 2021-08-28: qty 1.92

## 2021-08-28 MED ORDER — CEFAZOLIN SODIUM-DEXTROSE 2-4 GM/100ML-% IV SOLN
2.0000 g | INTRAVENOUS | Status: AC
  Administered 2021-08-29 (×2): 2 g via INTRAVENOUS
  Filled 2021-08-28: qty 100

## 2021-08-28 MED ORDER — PHENYLEPHRINE HCL-NACL 20-0.9 MG/250ML-% IV SOLN
30.0000 ug/min | INTRAVENOUS | Status: AC
  Administered 2021-08-29: 25 ug/min via INTRAVENOUS
  Filled 2021-08-28: qty 250

## 2021-08-28 MED ORDER — NITROGLYCERIN IN D5W 200-5 MCG/ML-% IV SOLN
2.0000 ug/min | INTRAVENOUS | Status: AC
  Administered 2021-08-29: 5 ug/min via INTRAVENOUS
  Filled 2021-08-28: qty 250

## 2021-08-28 MED ORDER — POTASSIUM CHLORIDE 2 MEQ/ML IV SOLN
80.0000 meq | INTRAVENOUS | Status: DC
  Filled 2021-08-28: qty 40

## 2021-08-28 NOTE — Progress Notes (Signed)
Mobility Specialist Progress Note    08/28/21 1220  Mobility  Activity Ambulated in hall  Level of Assistance Independent  Assistive Device None  Distance Ambulated (ft) 2200 ft  Mobility Ambulated independently in hallway  Mobility Response Tolerated well  Mobility performed by Mobility specialist  $Mobility charge 1 Mobility   Post-Mobility: 86 HR  Pt received in bed and agreeable. No complaints on walk and states he gets himself up to walk throughout the day. Returned to bed with call bell in reach.   Hildred Alamin Mobility Specialist  Mobility Specialist Phone: [phone]

## 2021-08-28 NOTE — Progress Notes (Signed)
5 Days Post-Op Procedure(s) (LRB): LEFT HEART CATH AND CORONARY ANGIOGRAPHY (N/A) Subjective: No complaints, a little anxious about surgery  Objective: Vital signs in last 24 hours: Temp:  [97.4 F (36.3 C)-98.5 F (36.9 C)] 97.5 F (36.4 C) (11/01 0351) Pulse Rate:  [62-65] 62 (11/01 0351) Cardiac Rhythm: Normal sinus rhythm;Bundle branch block (11/01 0745) Resp:  [16-18] 18 (11/01 0351) BP: (116-124)/(62-84) 117/69 (11/01 0351) SpO2:  [94 %-95 %] 94 % (11/01 0351)  Hemodynamic parameters for last 24 hours:    Intake/Output from previous day: 10/31 0701 - 11/01 0700 In: 800.9 [P.O.:480; I.V.:320.9] Out: 2475 [Urine:2475] Intake/Output this shift: No intake/output data recorded.  General appearance: alert and cooperative Neurologic: intact Heart: regular rate and rhythm Lungs: clear to auscultation bilaterally  Lab Results: Recent Labs    08/27/21 0233 08/28/21 0302  WBC 9.2 9.6  HGB 13.0 12.9*  HCT 39.0 37.9*  PLT 220 208   BMET:  Recent Labs    08/26/21 0944  NA 136  K 4.2  CL 106  CO2 24  GLUCOSE 102*  BUN 16  CREATININE 0.86  CALCIUM 9.6    PT/INR:  Recent Labs    08/27/21 0233  LABPROT 13.1  INR 1.0   ABG No results found for: PHART, HCO3, TCO2, ACIDBASEDEF, O2SAT CBG (last 3)  No results for input(s): GLUCAP in the last 72 hours.  Assessment/Plan: S/P Procedure(s) (LRB): LEFT HEART CATH AND CORONARY ANGIOGRAPHY (N/A) 3 vessel CAD For CABG in AM He is aware of risks and benefits All questions answered    LOS: 5 days    Melrose Nakayama 08/28/2021

## 2021-08-28 NOTE — Progress Notes (Addendum)
The patient has been seen in conjunction with Harlan Stains, NP. All aspects of care have been considered and discussed. The patient has been personally interviewed, examined, and all clinical data has been reviewed.  He is stable.  Denies angina. Labs look good. For CABG tomorrow per Dr. [name].   Progress Note  Patient Name: Kyle Strickland Date of Encounter: 08/28/2021  [facility] Cardiologist: Larae Grooms, MD   Subjective   Feeling well, planned for CABG tomorrow.   Inpatient Medications    Scheduled Meds:  aspirin EC  81 mg Oral Daily   atorvastatin  80 mg Oral Daily   [START ON 08/29/2021] epinephrine  0-10 mcg/min Intravenous To OR   [START ON 08/29/2021] heparin-papaverine-plasmalyte irrigation   Irrigation To OR   influenza vaccine adjuvanted  0.5 mL Intramuscular Tomorrow-1000   [START ON 08/29/2021] insulin   Intravenous To OR   loratadine  10 mg Oral Daily   [START ON 08/29/2021] magnesium sulfate  40 mEq Other To OR   metoprolol tartrate  25 mg Oral BID   [START ON 08/29/2021] phenylephrine  30-200 mcg/min Intravenous To OR   [START ON 08/29/2021] potassium chloride  80 mEq Other To OR   sodium chloride flush  3 mL Intravenous Q12H   sodium chloride flush  3 mL Intravenous Q12H   [START ON 08/29/2021] tranexamic acid  15 mg/kg Intravenous To OR   [START ON 08/29/2021] tranexamic acid  2 mg/kg Intracatheter To OR   Continuous Infusions:  sodium chloride     [START ON 08/29/2021]  ceFAZolin (ANCEF) IV     [START ON 08/29/2021]  ceFAZolin (ANCEF) IV     [START ON 08/29/2021] dexmedetomidine     [START ON 08/29/2021] heparin 30,000 units/NS 1000 mL solution for CELLSAVER     heparin 1,600 Units/hr (08/28/21 0304)   [START ON 08/29/2021] milrinone     [START ON 08/29/2021] nitroGLYCERIN     [START ON 08/29/2021] norepinephrine     [START ON 08/29/2021] tranexamic acid (CYKLOKAPRON) infusion ([facility])     [START ON 08/29/2021] vancomycin     PRN Meds: sodium  chloride, acetaminophen, guaiFENesin, nitroGLYCERIN, ondansetron (ZOFRAN) IV, sodium chloride flush   Vital Signs    Vitals:   08/27/21 1202 08/27/21 1518 08/27/21 2030 08/28/21 0351  BP: 124/73 121/62 116/84 117/69  Pulse:   65 62  Resp: 16 18 18 18   Temp: 98.5 F (36.9 C) (!) 97.4 F (36.3 C) 97.6 F (36.4 C) (!) 97.5 F (36.4 C)  TempSrc: Oral Oral Oral Oral  SpO2:   95% 94%  Weight:      Height:        Intake/Output Summary (Last 24 hours) at 08/28/2021 0807 Last data filed at 08/28/2021 0700 Gross per 24 hour  Intake 800.91 ml  Output 2050 ml  Net -1249.09 ml   Last 3 Weights 08/23/2021 08/23/2021 07/10/2021  Weight (lbs) 212 lb 205 lb 212 lb  Weight (kg) 96.163 kg 92.987 kg 96.163 kg      Telemetry    Sinus bradycardia - Personally Reviewed  ECG    No new tracing - Personally Reviewed  Physical Exam   GEN: No acute distress.   Neck: No JVD Cardiac: RRR, no murmurs, rubs, or gallops.  Respiratory: Clear to auscultation bilaterally. GI: Soft, nontender, non-distended  MS: No edema; No deformity. Neuro:  Nonfocal  Psych: Normal affect   Labs    High Sensitivity Troponin:   Recent Labs  Lab 08/23/21 0949  08/23/21 1200  TROPONINIHS 381* 403*     Chemistry Recent Labs  Lab 08/23/21 0949 08/26/21 0944  NA 137 136  K 4.4 4.2  CL 103 106  CO2 25 24  GLUCOSE 143* 102*  BUN 16 16  CREATININE 0.95 0.86  CALCIUM 10.3 9.6  GFRNONAA >60 >60  ANIONGAP 9 6    Lipids No results for input(s): CHOL, TRIG, HDL, LABVLDL, LDLCALC, CHOLHDL in the last 168 hours.  Hematology Recent Labs  Lab 08/26/21 0316 08/27/21 0233 08/28/21 0302  WBC 10.1 9.2 9.6  RBC 4.13* 4.13* 4.03*  HGB 13.4 13.0 12.9*  HCT 39.3 39.0 37.9*  MCV 95.2 94.4 94.0  MCH 32.4 31.5 32.0  MCHC 34.1 33.3 34.0  RDW 12.2 12.3 12.3  PLT 217 220 208   Thyroid No results for input(s): TSH, FREET4 in the last 168 hours.  BNPNo results for input(s): BNP, PROBNP in the last 168 hours.   DDimer No results for input(s): DDIMER in the last 168 hours.   Radiology    VAS US DOPPLER PRE CABG  Result Date: 08/27/2021 PREOPERATIVE VASCULAR EVALUATION Patient Name:  Kyle Strickland  Date of Exam:   08/26/2021 Medical Rec #: [id number]       Accession #:    [id number] Date of Birth: 08-18-1946      Patient Gender: M Patient Age:   75 years Exam Location:  [facility] Procedure:      VAS US DOPPLER PRE CABG Referring Phys: HARRELL LIGHTFOOT --------------------------------------------------------------------------------  Indications:      Pre-CABG. Risk Factors:     Hyperlipidemia, prior MI, coronary artery disease. Other Factors:    Patient is left handed. Comparison Study: No prior studies. Performing Technologist: Darlin Coco RDMS, RVT  Examination Guidelines: A complete evaluation includes B-mode imaging, spectral Doppler, color Doppler, and power Doppler as needed of all accessible portions of each vessel. Bilateral testing is considered an integral part of a complete examination. Limited examinations for reoccurring indications may be performed as noted.  Right Carotid Findings: +----------+--------+--------+--------+------------------+------------------+           PSV cm/sEDV cm/sStenosisDescribe          Comments           +----------+--------+--------+--------+------------------+------------------+ CCA Prox  140     15                                tortuous           +----------+--------+--------+--------+------------------+------------------+ CCA Distal120     21                                intimal thickening +----------+--------+--------+--------+------------------+------------------+ ICA Prox  71      18              heterogenous, mild                   +----------+--------+--------+--------+------------------+------------------+ ICA Distal62      18                                                    +----------+--------+--------+--------+------------------+------------------+ ECA       123     15  tortuous           +----------+--------+--------+--------+------------------+------------------+ +----------+--------+-------+----------------+------------+           PSV cm/sEDV cmsDescribe        Arm Pressure +----------+--------+-------+----------------+------------+ Subclavian151            Multiphasic, WNL             +----------+--------+-------+----------------+------------+ +---------+--------+--+--------+--+---------+ VertebralPSV cm/s60EDV cm/s13Antegrade +---------+--------+--+--------+--+---------+ Left Carotid Findings: +----------+--------+--------+--------+------------------+------------------+           PSV cm/sEDV cm/sStenosisDescribe          Comments           +----------+--------+--------+--------+------------------+------------------+ CCA Prox  170     20                                                   +----------+--------+--------+--------+------------------+------------------+ CCA Distal103     17                                intimal thickening +----------+--------+--------+--------+------------------+------------------+ ICA Prox  63      16              heterogenous, mild                   +----------+--------+--------+--------+------------------+------------------+ ICA Distal80      21                                                   +----------+--------+--------+--------+------------------+------------------+ ECA       119     12                                tortuous           +----------+--------+--------+--------+------------------+------------------+  +----------+--------+--------+----------------+------------+ SubclavianPSV cm/sEDV cm/sDescribe        Arm Pressure +----------+--------+--------+----------------+------------+           158             Multiphasic, WNL              +----------+--------+--------+----------------+------------+ +---------+--------+--+--------+--+---------+ VertebralPSV cm/s68EDV cm/s16Antegrade +---------+--------+--+--------+--+---------+  ABI Findings: +--------+------------------+-----+---------+--------+ Right   Rt Pressure (mmHg)IndexWaveform Comment  +--------+------------------+-----+---------+--------+ Brachial                       triphasic         +--------+------------------+-----+---------+--------+ PTA                            triphasic         +--------+------------------+-----+---------+--------+ DP                             triphasic         +--------+------------------+-----+---------+--------+ +--------+------------------+-----+---------+-------+ Left    Lt Pressure (mmHg)IndexWaveform Comment +--------+------------------+-----+---------+-------+ Brachial                       triphasic        +--------+------------------+-----+---------+-------+ PTA  triphasic        +--------+------------------+-----+---------+-------+ DP                             triphasic        +--------+------------------+-----+---------+-------+  Right Doppler Findings: +--------+--------+-----+---------+--------+ Site    PressureIndexDoppler  Comments +--------+--------+-----+---------+--------+ Brachial             triphasic         +--------+--------+-----+---------+--------+ Radial               triphasic         +--------+--------+-----+---------+--------+ Ulnar                triphasic         +--------+--------+-----+---------+--------+  Left Doppler Findings: +--------+--------+-----+---------+--------+ Site    PressureIndexDoppler  Comments +--------+--------+-----+---------+--------+ Brachial             triphasic         +--------+--------+-----+---------+--------+ Radial               triphasic          +--------+--------+-----+---------+--------+ Ulnar                triphasic         +--------+--------+-----+---------+--------+  Summary: Right Carotid: The extracranial vessels were near-normal with only minimal wall                thickening or plaque. Left Carotid: The extracranial vessels were near-normal with only minimal wall               thickening or plaque. Vertebrals:  Bilateral vertebral arteries demonstrate antegrade flow. Subclavians: Normal flow hemodynamics were seen in bilateral subclavian              arteries. Right Upper Extremity: Doppler waveforms remain within normal limits with right radial compression. Doppler waveforms remain within normal limits with right ulnar compression. Left Upper Extremity: Doppler waveforms remain within normal limits with left radial compression. Doppler waveforms remain within normal limits with left ulnar compression.  Electronically signed by Orlie Pollen on 08/27/2021 at 2:34:49 PM.    Final     Cardiac Studies   Cath: 08/23/21   Mid RCA lesion is 100% stenosed.   Mid LAD lesion is 95% stenosed.   Prox LAD to Mid LAD lesion is 75% stenosed.   1st Diag lesion is 70% stenosed.   Prox Cx lesion is 90% stenosed.   1st Mrg lesion is 75% stenosed.   The left ventricular systolic function is normal.   LV end diastolic pressure is normal.   The left ventricular ejection fraction is 55-65% by visual estimate.   There is no aortic valve stenosis.   Severe, complex, multivessel CAD.    Plan for cardiac surgery consult.     Start high potency statin.  Add low dose beta blocker. Restart IV heparin 8 hours post sheath pull.     Diagnostic Dominance: Co-dominant    Patient Profile     75 y.o. male no history of CAD or tobacco use who is being seen 08/23/2021 for the evaluation of chest pain to med [facility].  Assessment & Plan    1. NSTEMI:  -- hsTn peaked 403, underwent cardiac cath noted above with 3v CAD.  -- CVTS has been  consulted and plan for CABG on Wednesday ( Dr. [name]) -- No recurrent chest pain -- 2D echo showed normal LV function with  EF 65 to 70% with mild RV enlargement and mild AI -- Continue IV heparin, aspirin 81 mg daily, high-dose statin and Lopressor 25 mg twice daily (will not titrate further due to HR in 50s) -- CR following   2. HLD:  -- LDL goal less than 70  -- 07/10/2021: Cholesterol 193; HDL 42.70; LDL Cholesterol 127; Triglycerides 115.0; VLDL 23.0  -- started on atorvastatin 80mg  daily -- Will need FLP and ALT in 6 to 8 weeks   3. HTN:  --BP well controlled  --Continue metoprolol 25 mg twice daily   For questions or updates, please contact [place] Please consult [email or web address] for contact info under        Signed, Reino Bellis, NP  08/28/2021, 8:07 AM

## 2021-08-28 NOTE — Progress Notes (Signed)
ANTICOAGULATION CONSULT NOTE   Pharmacy Consult for heparin Indication: chest pain/ACS  Allergies  Allergen Reactions   Ciprofloxacin     Hives post op   Ditropan [Oxybutynin Chloride]     Hives 2011, post op   Codeine     Nausea and headaches     Patient Measurements: Height: 5\' 11"  (180.3 cm) Weight: 96.2 kg (212 lb) IBW/kg (Calculated) : 75.3 Heparin Dosing Weight: 93kg  Vital Signs: Temp: 97.5 F (36.4 C) (11/01 0351) Temp Source: Oral (11/01 0351) BP: 117/69 (11/01 0351) Pulse Rate: 62 (11/01 0351)  Labs: Recent Labs    08/26/21 0316 08/26/21 0944 08/27/21 0233 08/28/21 0302  HGB 13.4  --  13.0 12.9*  HCT 39.3  --  39.0 37.9*  PLT 217  --  220 208  APTT  --   --  46*  --   LABPROT  --   --  13.1  --   INR  --   --  1.0  --   HEPARINUNFRC 0.49  --  0.30 0.53  CREATININE  --  0.86  --   --      Estimated Creatinine Clearance: 87.9 mL/min (by C-G formula based on SCr of 0.86 mg/dL).   Medical History: Past Medical History:  Diagnosis Date   Elevated blood-pressure reading without diagnosis of hypertension    Other and unspecified hyperlipidemia    Prostate cancer ([place]) 2011   Vitamin D deficiency      Assessment: 41 YOM presenting with CP, elevated troponin, he is not on anticoagulation PTA, CBC wnl. Pt s/p LHC w/ mvCAD, planning for coronary bypass. Heparin resumed postcath.  Heparin level therapeutic at 0.53, CBC stable.  Goal of Therapy:  Heparin level 0.3-0.7 units/ml Monitor platelets by anticoagulation protocol: Yes   Plan:  Continue heparin 1600 units/h Daily heparin level and CBC   Arrie Senate, PharmD, [place], [facility] Clinical Pharmacist [phone] Please check AMION for all [place] numbers 08/28/2021

## 2021-08-29 ENCOUNTER — Inpatient Hospital Stay (HOSPITAL_COMMUNITY): Payer: Medicare Other

## 2021-08-29 ENCOUNTER — Inpatient Hospital Stay (HOSPITAL_COMMUNITY): Payer: Medicare Other | Admitting: Certified Registered"

## 2021-08-29 ENCOUNTER — Inpatient Hospital Stay (HOSPITAL_COMMUNITY)
Admission: EM | Disposition: A | Discharge status: Home / Self Care | Payer: Self-pay | Source: Home / Self Care | Attending: Thoracic Surgery (Cardiothoracic Vascular Surgery)

## 2021-08-29 DIAGNOSIS — I2510 Atherosclerotic heart disease of native coronary artery without angina pectoris: Secondary | ICD-10-CM

## 2021-08-29 DIAGNOSIS — Z951 Presence of aortocoronary bypass graft: Secondary | ICD-10-CM

## 2021-08-29 HISTORY — PX: ENDOVEIN HARVEST OF GREATER SAPHENOUS VEIN: SHX5059

## 2021-08-29 HISTORY — PX: CORONARY ARTERY BYPASS GRAFT: SHX141

## 2021-08-29 HISTORY — PX: TEE WITHOUT CARDIOVERSION: SHX5443

## 2021-08-29 LAB — HEPARIN LEVEL (UNFRACTIONATED): Heparin Unfractionated: 0.59 IU/mL (ref 0.30–0.70)

## 2021-08-29 LAB — POCT I-STAT 7, (LYTES, BLD GAS, ICA,H+H)
Acid-Base Excess: 2 mmol/L (ref 0.0–2.0)
Acid-Base Excess: 1 mmol/L (ref 0.0–2.0)
Acid-base deficit: 2 mmol/L (ref 0.0–2.0)
Acid-base deficit: 3 mmol/L — ABNORMAL HIGH (ref 0.0–2.0)
Acid-base deficit: 3 mmol/L — ABNORMAL HIGH (ref 0.0–2.0)
Bicarbonate: 26.5 mmol/L (ref 20.0–28.0)
Bicarbonate: 25.2 mmol/L (ref 20.0–28.0)
Bicarbonate: 24.3 mmol/L (ref 20.0–28.0)
Bicarbonate: 22.4 mmol/L (ref 20.0–28.0)
Bicarbonate: 22.1 mmol/L (ref 20.0–28.0)
Calcium, Ion: 1.11 mmol/L — ABNORMAL LOW (ref 1.15–1.40)
Calcium, Ion: 1.2 mmol/L (ref 1.15–1.40)
Calcium, Ion: 1.18 mmol/L (ref 1.15–1.40)
Calcium, Ion: 1.22 mmol/L (ref 1.15–1.40)
Calcium, Ion: 1.24 mmol/L (ref 1.15–1.40)
HCT: 27 % — ABNORMAL LOW (ref 39.0–52.0)
HCT: 31 % — ABNORMAL LOW (ref 39.0–52.0)
HCT: 32 % — ABNORMAL LOW (ref 39.0–52.0)
HCT: 32 % — ABNORMAL LOW (ref 39.0–52.0)
HCT: 31 % — ABNORMAL LOW (ref 39.0–52.0)
Hemoglobin: 9.2 g/dL — ABNORMAL LOW (ref 13.0–17.0)
Hemoglobin: 10.5 g/dL — ABNORMAL LOW (ref 13.0–17.0)
Hemoglobin: 10.9 g/dL — ABNORMAL LOW (ref 13.0–17.0)
Hemoglobin: 10.9 g/dL — ABNORMAL LOW (ref 13.0–17.0)
Hemoglobin: 10.5 g/dL — ABNORMAL LOW (ref 13.0–17.0)
O2 Saturation: 100 %
O2 Saturation: 100 %
O2 Saturation: 99 %
O2 Saturation: 98 %
O2 Saturation: 98 %
Patient temperature: 35.9
Patient temperature: 36.7
Potassium: 4.6 mmol/L (ref 3.5–5.1)
Potassium: 5.3 mmol/L — ABNORMAL HIGH (ref 3.5–5.1)
Potassium: 4.5 mmol/L (ref 3.5–5.1)
Potassium: 4.3 mmol/L (ref 3.5–5.1)
Potassium: 4.3 mmol/L (ref 3.5–5.1)
Sodium: 137 mmol/L (ref 135–145)
Sodium: 137 mmol/L (ref 135–145)
Sodium: 139 mmol/L (ref 135–145)
Sodium: 139 mmol/L (ref 135–145)
Sodium: 137 mmol/L (ref 135–145)
TCO2: 28 mmol/L (ref 22–32)
TCO2: 26 mmol/L (ref 22–32)
TCO2: 26 mmol/L (ref 22–32)
TCO2: 24 mmol/L (ref 22–32)
TCO2: 23 mmol/L (ref 22–32)
pCO2 arterial: 41.3 mmHg (ref 32.0–48.0)
pCO2 arterial: 38.3 mmHg (ref 32.0–48.0)
pCO2 arterial: 44.5 mmHg (ref 32.0–48.0)
pCO2 arterial: 40 mmHg (ref 32.0–48.0)
pCO2 arterial: 39.6 mmHg (ref 32.0–48.0)
pH, Arterial: 7.416 (ref 7.350–7.450)
pH, Arterial: 7.426 (ref 7.350–7.450)
pH, Arterial: 7.339 — ABNORMAL LOW (ref 7.350–7.450)
pH, Arterial: 7.356 (ref 7.350–7.450)
pH, Arterial: 7.352 (ref 7.350–7.450)
pO2, Arterial: 309 mmHg — ABNORMAL HIGH (ref 83.0–108.0)
pO2, Arterial: 276 mmHg — ABNORMAL HIGH (ref 83.0–108.0)
pO2, Arterial: 120 mmHg — ABNORMAL HIGH (ref 83.0–108.0)
pO2, Arterial: 115 mmHg — ABNORMAL HIGH (ref 83.0–108.0)
pO2, Arterial: 101 mmHg (ref 83.0–108.0)

## 2021-08-29 LAB — POCT I-STAT EG7
Acid-Base Excess: 1 mmol/L (ref 0.0–2.0)
Bicarbonate: 26.2 mmol/L (ref 20.0–28.0)
Calcium, Ion: 1.15 mmol/L (ref 1.15–1.40)
HCT: 28 % — ABNORMAL LOW (ref 39.0–52.0)
Hemoglobin: 9.5 g/dL — ABNORMAL LOW (ref 13.0–17.0)
O2 Saturation: 79 %
Potassium: 4.6 mmol/L (ref 3.5–5.1)
Sodium: 138 mmol/L (ref 135–145)
TCO2: 27 mmol/L (ref 22–32)
pCO2, Ven: 43.2 mmHg — ABNORMAL LOW (ref 44.0–60.0)
pH, Ven: 7.39 (ref 7.250–7.430)
pO2, Ven: 44 mmHg (ref 32.0–45.0)

## 2021-08-29 LAB — POCT I-STAT, CHEM 8
BUN: 15 mg/dL (ref 8–23)
BUN: 14 mg/dL (ref 8–23)
BUN: 14 mg/dL (ref 8–23)
BUN: 14 mg/dL (ref 8–23)
Calcium, Ion: 1.33 mmol/L (ref 1.15–1.40)
Calcium, Ion: 1.17 mmol/L (ref 1.15–1.40)
Calcium, Ion: 1.18 mmol/L (ref 1.15–1.40)
Calcium, Ion: 1.19 mmol/L (ref 1.15–1.40)
Chloride: 103 mmol/L (ref 98–111)
Chloride: 103 mmol/L (ref 98–111)
Chloride: 102 mmol/L (ref 98–111)
Chloride: 104 mmol/L (ref 98–111)
Creatinine, Ser: 0.8 mg/dL (ref 0.61–1.24)
Creatinine, Ser: 0.7 mg/dL (ref 0.61–1.24)
Creatinine, Ser: 0.7 mg/dL (ref 0.61–1.24)
Creatinine, Ser: 0.8 mg/dL (ref 0.61–1.24)
Glucose, Bld: 134 mg/dL — ABNORMAL HIGH (ref 70–99)
Glucose, Bld: 151 mg/dL — ABNORMAL HIGH (ref 70–99)
Glucose, Bld: 169 mg/dL — ABNORMAL HIGH (ref 70–99)
Glucose, Bld: 177 mg/dL — ABNORMAL HIGH (ref 70–99)
HCT: 37 % — ABNORMAL LOW (ref 39.0–52.0)
HCT: 30 % — ABNORMAL LOW (ref 39.0–52.0)
HCT: 29 % — ABNORMAL LOW (ref 39.0–52.0)
HCT: 30 % — ABNORMAL LOW (ref 39.0–52.0)
Hemoglobin: 12.6 g/dL — ABNORMAL LOW (ref 13.0–17.0)
Hemoglobin: 10.2 g/dL — ABNORMAL LOW (ref 13.0–17.0)
Hemoglobin: 10.2 g/dL — ABNORMAL LOW (ref 13.0–17.0)
Hemoglobin: 9.9 g/dL — ABNORMAL LOW (ref 13.0–17.0)
Potassium: 4.4 mmol/L (ref 3.5–5.1)
Potassium: 5 mmol/L (ref 3.5–5.1)
Potassium: 5.2 mmol/L — ABNORMAL HIGH (ref 3.5–5.1)
Potassium: 5.7 mmol/L — ABNORMAL HIGH (ref 3.5–5.1)
Sodium: 140 mmol/L (ref 135–145)
Sodium: 138 mmol/L (ref 135–145)
Sodium: 137 mmol/L (ref 135–145)
Sodium: 137 mmol/L (ref 135–145)
TCO2: 26 mmol/L (ref 22–32)
TCO2: 26 mmol/L (ref 22–32)
TCO2: 24 mmol/L (ref 22–32)
TCO2: 26 mmol/L (ref 22–32)

## 2021-08-29 LAB — CBC
HCT: 40.4 % (ref 39.0–52.0)
HCT: 34.2 % — ABNORMAL LOW (ref 39.0–52.0)
HCT: 32.7 % — ABNORMAL LOW (ref 39.0–52.0)
Hemoglobin: 13.6 g/dL (ref 13.0–17.0)
Hemoglobin: 11.7 g/dL — ABNORMAL LOW (ref 13.0–17.0)
Hemoglobin: 11.1 g/dL — ABNORMAL LOW (ref 13.0–17.0)
MCH: 31.5 pg (ref 26.0–34.0)
MCH: 32.3 pg (ref 26.0–34.0)
MCH: 32.2 pg (ref 26.0–34.0)
MCHC: 33.7 g/dL (ref 30.0–36.0)
MCHC: 34.2 g/dL (ref 30.0–36.0)
MCHC: 33.9 g/dL (ref 30.0–36.0)
MCV: 93.5 fL (ref 80.0–100.0)
MCV: 94.5 fL (ref 80.0–100.0)
MCV: 94.8 fL (ref 80.0–100.0)
Platelets: 218 10*3/uL (ref 150–400)
Platelets: 172 10*3/uL (ref 150–400)
Platelets: 171 10*3/uL (ref 150–400)
RBC: 4.32 MIL/uL (ref 4.22–5.81)
RBC: 3.62 MIL/uL — ABNORMAL LOW (ref 4.22–5.81)
RBC: 3.45 MIL/uL — ABNORMAL LOW (ref 4.22–5.81)
RDW: 12.4 % (ref 11.5–15.5)
RDW: 12.2 % (ref 11.5–15.5)
RDW: 12.2 % (ref 11.5–15.5)
WBC: 11.3 10*3/uL — ABNORMAL HIGH (ref 4.0–10.5)
WBC: 27.3 10*3/uL — ABNORMAL HIGH (ref 4.0–10.5)
WBC: 23.4 10*3/uL — ABNORMAL HIGH (ref 4.0–10.5)
nRBC: 0 % (ref 0.0–0.2)
nRBC: 0 % (ref 0.0–0.2)
nRBC: 0 % (ref 0.0–0.2)

## 2021-08-29 LAB — PROTIME-INR
INR: 1.3 — ABNORMAL HIGH (ref 0.8–1.2)
Prothrombin Time: 16 seconds — ABNORMAL HIGH (ref 11.4–15.2)

## 2021-08-29 LAB — BASIC METABOLIC PANEL
Anion gap: 8 (ref 5–15)
Anion gap: 4 — ABNORMAL LOW (ref 5–15)
BUN: 15 mg/dL (ref 8–23)
BUN: 15 mg/dL (ref 8–23)
CO2: 23 mmol/L (ref 22–32)
CO2: 23 mmol/L (ref 22–32)
Calcium: 9.5 mg/dL (ref 8.9–10.3)
Calcium: 8.2 mg/dL — ABNORMAL LOW (ref 8.9–10.3)
Chloride: 105 mmol/L (ref 98–111)
Chloride: 109 mmol/L (ref 98–111)
Creatinine, Ser: 0.89 mg/dL (ref 0.61–1.24)
Creatinine, Ser: 0.8 mg/dL (ref 0.61–1.24)
GFR, Estimated: >60 mL/min (ref >60)
GFR, Estimated: >60 mL/min (ref >60)
Glucose, Bld: 138 mg/dL — ABNORMAL HIGH (ref 70–99)
Glucose, Bld: 158 mg/dL — ABNORMAL HIGH (ref 70–99)
Potassium: 3.9 mmol/L (ref 3.5–5.1)
Potassium: 4.3 mmol/L (ref 3.5–5.1)
Sodium: 136 mmol/L (ref 135–145)
Sodium: 136 mmol/L (ref 135–145)

## 2021-08-29 LAB — GLUCOSE, CAPILLARY
Glucose-Capillary: 108 mg/dL — ABNORMAL HIGH (ref 70–99)
Glucose-Capillary: 132 mg/dL — ABNORMAL HIGH (ref 70–99)
Glucose-Capillary: 135 mg/dL — ABNORMAL HIGH (ref 70–99)
Glucose-Capillary: 146 mg/dL — ABNORMAL HIGH (ref 70–99)
Glucose-Capillary: 151 mg/dL — ABNORMAL HIGH (ref 70–99)
Glucose-Capillary: 154 mg/dL — ABNORMAL HIGH (ref 70–99)
Glucose-Capillary: 154 mg/dL — ABNORMAL HIGH (ref 70–99)
Glucose-Capillary: 164 mg/dL — ABNORMAL HIGH (ref 70–99)
Glucose-Capillary: 166 mg/dL — ABNORMAL HIGH (ref 70–99)
Glucose-Capillary: 168 mg/dL — ABNORMAL HIGH (ref 70–99)
Glucose-Capillary: 171 mg/dL — ABNORMAL HIGH (ref 70–99)

## 2021-08-29 LAB — PLATELET COUNT: Platelets: 182 10*3/uL (ref 150–400)

## 2021-08-29 LAB — MAGNESIUM: Magnesium: 2.8 mg/dL — ABNORMAL HIGH (ref 1.7–2.4)

## 2021-08-29 LAB — APTT: aPTT: 30 seconds (ref 24–36)

## 2021-08-29 LAB — HEMOGLOBIN AND HEMATOCRIT, BLOOD
HCT: 30.8 % — ABNORMAL LOW (ref 39.0–52.0)
Hemoglobin: 10.7 g/dL — ABNORMAL LOW (ref 13.0–17.0)

## 2021-08-29 SURGERY — CORONARY ARTERY BYPASS GRAFTING (CABG)
Anesthesia: General | Site: Chest | Laterality: Right

## 2021-08-29 MED ORDER — SODIUM CHLORIDE 0.9 % IV SOLN: 250.0000 mL | INTRAVENOUS | Status: DC

## 2021-08-29 MED ORDER — STERILE WATER FOR IRRIGATION IR SOLN
Status: DC | PRN
  Administered 2021-08-29: 2000 mL

## 2021-08-29 MED ORDER — LACTATED RINGERS IV SOLN: INTRAVENOUS | Status: DC | PRN

## 2021-08-29 MED ORDER — ONDANSETRON HCL 4 MG/2ML IJ SOLN
4.0000 mg | Freq: Four times a day (QID) | INTRAMUSCULAR | Status: DC | PRN
  Administered 2021-08-30 (×2): 4 mg via INTRAVENOUS
  Filled 2021-08-29 (×2): qty 2

## 2021-08-29 MED ORDER — PHENYLEPHRINE 40 MCG/ML (10ML) SYRINGE FOR IV PUSH (FOR BLOOD PRESSURE SUPPORT)
PREFILLED_SYRINGE | INTRAVENOUS | Status: DC | PRN
  Administered 2021-08-29 (×3): 80 ug via INTRAVENOUS

## 2021-08-29 MED ORDER — MORPHINE SULFATE (PF) 2 MG/ML IV SOLN
1.0000 mg | INTRAVENOUS | Status: DC | PRN
  Administered 2021-08-29 (×2): 4 mg via INTRAVENOUS
  Administered 2021-08-29 – 2021-08-30 (×4): 2 mg via INTRAVENOUS
  Filled 2021-08-29: qty 1
  Filled 2021-08-29: qty 2
  Filled 2021-08-29 (×3): qty 1
  Filled 2021-08-29: qty 2

## 2021-08-29 MED ORDER — LACTATED RINGERS IV SOLN
INTRAVENOUS | Status: DC
  Administered 2021-08-29: 20 mL via INTRAVENOUS

## 2021-08-29 MED ORDER — LACTATED RINGERS IV SOLN: 500.0000 mL | Freq: Once | INTRAVENOUS | Status: DC | PRN

## 2021-08-29 MED ORDER — CEFAZOLIN SODIUM-DEXTROSE 2-4 GM/100ML-% IV SOLN
2.0000 g | Freq: Three times a day (TID) | INTRAVENOUS | Status: DC
  Administered 2021-08-29 – 2021-08-31 (×5): 2 g via INTRAVENOUS
  Filled 2021-08-29 (×5): qty 100

## 2021-08-29 MED ORDER — FENTANYL CITRATE (PF) 250 MCG/5ML IJ SOLN
INTRAMUSCULAR | Status: AC
  Filled 2021-08-29: qty 25

## 2021-08-29 MED ORDER — PROPOFOL 10 MG/ML IV BOLUS
INTRAVENOUS | Status: DC | PRN
  Administered 2021-08-29: 30 mg via INTRAVENOUS

## 2021-08-29 MED ORDER — MAGNESIUM SULFATE 4 GM/100ML IV SOLN
4.0000 g | Freq: Once | INTRAVENOUS | Status: AC
  Administered 2021-08-29: 4 g via INTRAVENOUS
  Filled 2021-08-29: qty 100

## 2021-08-29 MED ORDER — PHENYLEPHRINE 40 MCG/ML (10ML) SYRINGE FOR IV PUSH (FOR BLOOD PRESSURE SUPPORT)
PREFILLED_SYRINGE | INTRAVENOUS | Status: AC
  Filled 2021-08-29: qty 10

## 2021-08-29 MED ORDER — SODIUM CHLORIDE 0.9 % IV SOLN: INTRAVENOUS | Status: DC

## 2021-08-29 MED ORDER — SODIUM CHLORIDE 0.45 % IV SOLN: INTRAVENOUS | Status: DC | PRN

## 2021-08-29 MED ORDER — SODIUM CHLORIDE 0.9% FLUSH
3.0000 mL | Freq: Two times a day (BID) | INTRAVENOUS | Status: DC
  Administered 2021-08-30 (×2): 3 mL via INTRAVENOUS

## 2021-08-29 MED ORDER — METOPROLOL TARTRATE 5 MG/5ML IV SOLN
2.5000 mg | INTRAVENOUS | Status: DC | PRN
  Administered 2021-09-02: 2.5 mg via INTRAVENOUS
  Filled 2021-08-29: qty 5

## 2021-08-29 MED ORDER — ASPIRIN EC 325 MG PO TBEC
325.0000 mg | DELAYED_RELEASE_TABLET | Freq: Every day | ORAL | Status: AC
  Administered 2021-08-30 – 2021-09-01 (×3): 325 mg via ORAL
  Filled 2021-08-29 (×3): qty 1

## 2021-08-29 MED ORDER — PHENYLEPHRINE HCL-NACL 20-0.9 MG/250ML-% IV SOLN
0.0000 ug/min | INTRAVENOUS | Status: DC
  Administered 2021-08-30: 25 ug/min via INTRAVENOUS
  Filled 2021-08-29: qty 250

## 2021-08-29 MED ORDER — CHLORHEXIDINE GLUCONATE CLOTH 2 % EX PADS
6.0000 | MEDICATED_PAD | Freq: Every day | CUTANEOUS | Status: DC
  Administered 2021-08-29 – 2021-09-01 (×4): 6 via TOPICAL

## 2021-08-29 MED ORDER — METOPROLOL TARTRATE 25 MG/10 ML ORAL SUSPENSION: 12.5000 mg | Freq: Two times a day (BID) | ORAL | Status: DC

## 2021-08-29 MED ORDER — HEPARIN SODIUM (PORCINE) 1000 UNIT/ML IJ SOLN
INTRAMUSCULAR | Status: AC
  Filled 2021-08-29: qty 1

## 2021-08-29 MED ORDER — INSULIN REGULAR(HUMAN) IN NACL 100-0.9 UT/100ML-% IV SOLN: INTRAVENOUS | Status: DC

## 2021-08-29 MED ORDER — SODIUM CHLORIDE 0.9% FLUSH: 3.0000 mL | INTRAVENOUS | Status: DC | PRN

## 2021-08-29 MED ORDER — HEMOSTATIC AGENTS (NO CHARGE) OPTIME
TOPICAL | Status: DC | PRN
  Administered 2021-08-29: 1 via TOPICAL

## 2021-08-29 MED ORDER — ALBUMIN HUMAN 5 % IV SOLN: INTRAVENOUS | Status: DC | PRN

## 2021-08-29 MED ORDER — ACETAMINOPHEN 160 MG/5ML PO SOLN: 650.0000 mg | Freq: Once | ORAL | Status: AC

## 2021-08-29 MED ORDER — METOPROLOL TARTRATE 12.5 MG HALF TABLET
12.5000 mg | ORAL_TABLET | Freq: Two times a day (BID) | ORAL | Status: DC
  Administered 2021-08-30 – 2021-08-31 (×4): 12.5 mg via ORAL
  Filled 2021-08-29 (×4): qty 1

## 2021-08-29 MED ORDER — PROTAMINE SULFATE 10 MG/ML IV SOLN
INTRAVENOUS | Status: DC | PRN
  Administered 2021-08-29: 230 mg via INTRAVENOUS
  Administered 2021-08-29: 30 mg via INTRAVENOUS

## 2021-08-29 MED ORDER — DEXTROSE 50 % IV SOLN: 0.0000 mL | INTRAVENOUS | Status: DC | PRN

## 2021-08-29 MED ORDER — MIDAZOLAM HCL 5 MG/5ML IJ SOLN
INTRAMUSCULAR | Status: DC | PRN
  Administered 2021-08-29: 1 mg via INTRAVENOUS
  Administered 2021-08-29: 2 mg via INTRAVENOUS
  Administered 2021-08-29: 1 mg via INTRAVENOUS
  Administered 2021-08-29 (×2): 2 mg via INTRAVENOUS

## 2021-08-29 MED ORDER — BISACODYL 10 MG RE SUPP: 10.0000 mg | Freq: Every day | RECTAL | Status: DC

## 2021-08-29 MED ORDER — ACETAMINOPHEN 650 MG RE SUPP
650.0000 mg | Freq: Once | RECTAL | Status: AC
  Administered 2021-08-29: 650 mg via RECTAL

## 2021-08-29 MED ORDER — HEPARIN SODIUM (PORCINE) 1000 UNIT/ML IJ SOLN
INTRAMUSCULAR | Status: DC | PRN
  Administered 2021-08-29: 2000 [IU] via INTRAVENOUS
  Administered 2021-08-29: 27000 [IU] via INTRAVENOUS

## 2021-08-29 MED ORDER — NITROGLYCERIN 0.2 MG/ML ON CALL CATH LAB
INTRAVENOUS | Status: DC | PRN
  Administered 2021-08-29 (×3): 40 ug via INTRAVENOUS

## 2021-08-29 MED ORDER — TRAMADOL HCL 50 MG PO TABS
50.0000 mg | ORAL_TABLET | ORAL | Status: DC | PRN
  Administered 2021-08-29 – 2021-08-30 (×7): 50 mg via ORAL
  Filled 2021-08-29 (×8): qty 1

## 2021-08-29 MED ORDER — PHENYLEPHRINE 40 MCG/ML (10ML) SYRINGE FOR IV PUSH (FOR BLOOD PRESSURE SUPPORT): PREFILLED_SYRINGE | INTRAVENOUS | Status: DC | PRN

## 2021-08-29 MED ORDER — EPHEDRINE 5 MG/ML INJ
INTRAVENOUS | Status: AC
  Filled 2021-08-29: qty 5

## 2021-08-29 MED ORDER — ALBUMIN HUMAN 5 % IV SOLN
250.0000 mL | INTRAVENOUS | Status: AC | PRN
  Administered 2021-08-29 (×2): 12.5 g via INTRAVENOUS
  Filled 2021-08-29: qty 250

## 2021-08-29 MED ORDER — MIDAZOLAM HCL 2 MG/2ML IJ SOLN: 2.0000 mg | INTRAMUSCULAR | Status: DC | PRN

## 2021-08-29 MED ORDER — MIDAZOLAM HCL (PF) 10 MG/2ML IJ SOLN
INTRAMUSCULAR | Status: AC
  Filled 2021-08-29: qty 2

## 2021-08-29 MED ORDER — PROPOFOL 10 MG/ML IV BOLUS
INTRAVENOUS | Status: AC
  Filled 2021-08-29: qty 20

## 2021-08-29 MED ORDER — BISACODYL 5 MG PO TBEC
10.0000 mg | DELAYED_RELEASE_TABLET | Freq: Every day | ORAL | Status: DC
  Administered 2021-08-30 – 2021-09-01 (×3): 10 mg via ORAL
  Filled 2021-08-29 (×3): qty 2

## 2021-08-29 MED ORDER — 0.9 % SODIUM CHLORIDE (POUR BTL) OPTIME
TOPICAL | Status: DC | PRN
  Administered 2021-08-29: 5000 mL

## 2021-08-29 MED ORDER — POTASSIUM CHLORIDE 10 MEQ/50ML IV SOLN
10.0000 meq | INTRAVENOUS | Status: AC
  Filled 2021-08-29: qty 50

## 2021-08-29 MED ORDER — SODIUM CHLORIDE (PF) 0.9 % IJ SOLN
OROMUCOSAL | Status: DC | PRN
  Administered 2021-08-29 (×3): 4 mL via TOPICAL

## 2021-08-29 MED ORDER — VANCOMYCIN HCL IN DEXTROSE 1-5 GM/200ML-% IV SOLN
1000.0000 mg | Freq: Once | INTRAVENOUS | Status: AC
  Administered 2021-08-29: 1000 mg via INTRAVENOUS
  Filled 2021-08-29: qty 200

## 2021-08-29 MED ORDER — OXYCODONE HCL 5 MG PO TABS: 5.0000 mg | ORAL_TABLET | ORAL | Status: DC | PRN

## 2021-08-29 MED ORDER — ROCURONIUM BROMIDE 10 MG/ML (PF) SYRINGE
PREFILLED_SYRINGE | INTRAVENOUS | Status: AC
  Filled 2021-08-29: qty 20

## 2021-08-29 MED ORDER — PLASMA-LYTE A IV SOLN
INTRAVENOUS | Status: DC | PRN
  Administered 2021-08-29: 1000 mL via INTRAVASCULAR

## 2021-08-29 MED ORDER — ASPIRIN 81 MG PO CHEW: 324.0000 mg | CHEWABLE_TABLET | Freq: Every day | ORAL | Status: AC

## 2021-08-29 MED ORDER — EPHEDRINE SULFATE 50 MG/ML IJ SOLN
INTRAMUSCULAR | Status: DC | PRN
  Administered 2021-08-29: 2.5 mg via INTRAVENOUS

## 2021-08-29 MED ORDER — LACTATED RINGERS IV SOLN: INTRAVENOUS | Status: DC

## 2021-08-29 MED ORDER — ACETAMINOPHEN 500 MG PO TABS
1000.0000 mg | ORAL_TABLET | Freq: Four times a day (QID) | ORAL | Status: AC
  Administered 2021-08-30 – 2021-09-03 (×18): 1000 mg via ORAL
  Filled 2021-08-29 (×17): qty 2

## 2021-08-29 MED ORDER — CHLORHEXIDINE GLUCONATE 0.12 % MT SOLN
15.0000 mL | OROMUCOSAL | Status: AC
  Administered 2021-08-29: 15 mL via OROMUCOSAL

## 2021-08-29 MED ORDER — ALBUMIN HUMAN 25 % IV SOLN: 12.5000 g | INTRAVENOUS | Status: DC | PRN

## 2021-08-29 MED ORDER — SODIUM CHLORIDE 0.9 % IV SOLN: INTRAVENOUS | Status: DC | PRN

## 2021-08-29 MED ORDER — DOCUSATE SODIUM 100 MG PO CAPS
200.0000 mg | ORAL_CAPSULE | Freq: Every day | ORAL | Status: DC
  Administered 2021-08-30 – 2021-09-01 (×3): 200 mg via ORAL
  Filled 2021-08-29 (×3): qty 2

## 2021-08-29 MED ORDER — ALBUMIN HUMAN 5 % IV SOLN
250.0000 mL | INTRAVENOUS | Status: DC | PRN
  Administered 2021-08-29: 12.5 g via INTRAVENOUS

## 2021-08-29 MED ORDER — DEXMEDETOMIDINE HCL IN NACL 400 MCG/100ML IV SOLN
0.0000 ug/kg/h | INTRAVENOUS | Status: DC
  Filled 2021-08-29: qty 100

## 2021-08-29 MED ORDER — SODIUM CHLORIDE 0.9 % IV BOLUS: 250.0000 mL | INTRAVENOUS | Status: DC | PRN

## 2021-08-29 MED ORDER — PANTOPRAZOLE SODIUM 40 MG PO TBEC
40.0000 mg | DELAYED_RELEASE_TABLET | Freq: Every day | ORAL | Status: DC
  Administered 2021-08-31 – 2021-09-04 (×5): 40 mg via ORAL
  Filled 2021-08-29 (×5): qty 1

## 2021-08-29 MED ORDER — NITROGLYCERIN IN D5W 200-5 MCG/ML-% IV SOLN
0.0000 ug/min | INTRAVENOUS | Status: DC
  Administered 2021-08-29: 0 ug/min via INTRAVENOUS

## 2021-08-29 MED ORDER — ACETAMINOPHEN 160 MG/5ML PO SOLN: 1000.0000 mg | Freq: Four times a day (QID) | ORAL | Status: AC

## 2021-08-29 MED ORDER — FAMOTIDINE IN NACL 20-0.9 MG/50ML-% IV SOLN
20.0000 mg | Freq: Two times a day (BID) | INTRAVENOUS | Status: AC
  Administered 2021-08-29: 20 mg via INTRAVENOUS
  Filled 2021-08-29: qty 50

## 2021-08-29 MED ORDER — FENTANYL CITRATE (PF) 250 MCG/5ML IJ SOLN
INTRAMUSCULAR | Status: DC | PRN
  Administered 2021-08-29: 100 ug via INTRAVENOUS
  Administered 2021-08-29: 400 ug via INTRAVENOUS
  Administered 2021-08-29: 150 ug via INTRAVENOUS
  Administered 2021-08-29 (×4): 100 ug via INTRAVENOUS
  Administered 2021-08-29: 50 ug via INTRAVENOUS
  Administered 2021-08-29: 150 ug via INTRAVENOUS

## 2021-08-29 MED ORDER — ROCURONIUM BROMIDE 10 MG/ML (PF) SYRINGE
PREFILLED_SYRINGE | INTRAVENOUS | Status: DC | PRN
  Administered 2021-08-29: 70 mg via INTRAVENOUS
  Administered 2021-08-29 (×2): 50 mg via INTRAVENOUS
  Administered 2021-08-29: 30 mg via INTRAVENOUS

## 2021-08-29 SURGICAL SUPPLY — 95 items
ADH SKN CLS APL DERMABOND .7 (GAUZE/BANDAGES/DRESSINGS) ×4
BAG DECANTER FOR FLEXI CONT (MISCELLANEOUS) ×5 IMPLANT
BLADE CLIPPER SURG (BLADE) ×5 IMPLANT
BLADE STERNUM SYSTEM 6 (BLADE) ×5 IMPLANT
BNDG ELASTIC 4X5.8 VLCR STR LF (GAUZE/BANDAGES/DRESSINGS) ×5 IMPLANT
BNDG ELASTIC 6X5.8 VLCR STR LF (GAUZE/BANDAGES/DRESSINGS) ×5 IMPLANT
BNDG GAUZE ELAST 4 BULKY (GAUZE/BANDAGES/DRESSINGS) ×5 IMPLANT
CANISTER SUCT 3000ML PPV (MISCELLANEOUS) ×5 IMPLANT
CANNULA EZ GLIDE AORTIC 21FR (CANNULA) ×5 IMPLANT
CATH CPB KIT HENDRICKSON (MISCELLANEOUS) ×5 IMPLANT
CATH ROBINSON RED A/P 18FR (CATHETERS) ×5 IMPLANT
CATH THORACIC 36FR (CATHETERS) ×5 IMPLANT
CATH THORACIC 36FR RT ANG (CATHETERS) ×5 IMPLANT
CLIP FOGARTY SPRING 6M (CLIP) ×1 IMPLANT
CLIP TI WIDE RED SMALL 24 (CLIP) ×1 IMPLANT
CLIP VESOCCLUDE MED 24/CT (CLIP) IMPLANT
CLIP VESOCCLUDE SM WIDE 24/CT (CLIP) ×3 IMPLANT
CONTAINER PROTECT SURGISLUSH (MISCELLANEOUS) ×4 IMPLANT
DERMABOND ADVANCED (GAUZE/BANDAGES/DRESSINGS) ×1
DERMABOND ADVANCED .7 DNX12 (GAUZE/BANDAGES/DRESSINGS) IMPLANT
DRAPE CARDIOVASCULAR INCISE (DRAPES) ×5
DRAPE SRG 135X102X78XABS (DRAPES) ×4 IMPLANT
DRAPE WARM FLUID 44X44 (DRAPES) ×4 IMPLANT
DRSG COVADERM 4X14 (GAUZE/BANDAGES/DRESSINGS) ×5 IMPLANT
ELECT REM PT RETURN 9FT ADLT (ELECTROSURGICAL) ×10
ELECTRODE REM PT RTRN 9FT ADLT (ELECTROSURGICAL) ×8 IMPLANT
FELT TEFLON 1X6 (MISCELLANEOUS) ×9 IMPLANT
GAUZE 4X4 16PLY ~~LOC~~+RFID DBL (SPONGE) ×1 IMPLANT
GAUZE SPONGE 4X4 12PLY STRL (GAUZE/BANDAGES/DRESSINGS) ×9 IMPLANT
GAUZE SPONGE 4X4 12PLY STRL LF (GAUZE/BANDAGES/DRESSINGS) ×1 IMPLANT
GLOVE SURG MICRO LTX SZ6 (GLOVE) ×3 IMPLANT
GLOVE SURG MICRO LTX SZ6.5 (GLOVE) ×1 IMPLANT
GLOVE SURG MICRO LTX SZ7 (GLOVE) ×2 IMPLANT
GLOVE SURG SIGNA 7.5 PF LTX (GLOVE) ×12 IMPLANT
GLOVE SURG UNDER POLY LF SZ6 (GLOVE) ×2 IMPLANT
GLOVE SURG UNDER POLY LF SZ9 (GLOVE) ×1 IMPLANT
GOWN STRL REUS W/ TWL LRG LVL3 (GOWN DISPOSABLE) ×16 IMPLANT
GOWN STRL REUS W/ TWL XL LVL3 (GOWN DISPOSABLE) ×8 IMPLANT
GOWN STRL REUS W/TWL LRG LVL3 (GOWN DISPOSABLE) ×30
GOWN STRL REUS W/TWL XL LVL3 (GOWN DISPOSABLE) ×20
HEMOSTAT POWDER SURGIFOAM 1G (HEMOSTASIS) ×15 IMPLANT
HEMOSTAT SURGICEL 2X14 (HEMOSTASIS) ×5 IMPLANT
INSERT FOGARTY XLG (MISCELLANEOUS) IMPLANT
KIT BASIN OR (CUSTOM PROCEDURE TRAY) ×5 IMPLANT
KIT SUCTION CATH 14FR (SUCTIONS) ×10 IMPLANT
KIT TURNOVER KIT B (KITS) ×5 IMPLANT
KIT VASOVIEW HEMOPRO 2 VH 4000 (KITS) ×5 IMPLANT
MARKER GRAFT CORONARY BYPASS (MISCELLANEOUS) ×16 IMPLANT
MARKER SKIN DUAL TIP RULER LAB (MISCELLANEOUS) ×1 IMPLANT
NS IRRIG 1000ML POUR BTL (IV SOLUTION) ×25 IMPLANT
PACK E OPEN HEART (SUTURE) ×5 IMPLANT
PACK OPEN HEART (CUSTOM PROCEDURE TRAY) ×5 IMPLANT
PAD ARMBOARD 7.5X6 YLW CONV (MISCELLANEOUS) ×10 IMPLANT
PAD ELECT DEFIB RADIOL ZOLL (MISCELLANEOUS) ×5 IMPLANT
PENCIL BUTTON HOLSTER BLD 10FT (ELECTRODE) ×5 IMPLANT
POSITIONER HEAD DONUT 9IN (MISCELLANEOUS) ×5 IMPLANT
PUNCH AORTIC ROTATE 4.0MM (MISCELLANEOUS) ×1 IMPLANT
PUNCH AORTIC ROTATE 4.5MM 8IN (MISCELLANEOUS) ×1 IMPLANT
PUNCH AORTIC ROTATE 5MM 8IN (MISCELLANEOUS) IMPLANT
SET MPS 3-ND DEL (MISCELLANEOUS) ×1 IMPLANT
SOL PREP POV-IOD 4OZ 10% (MISCELLANEOUS) ×2 IMPLANT
SOL PREP PROV IODINE SCRUB 4OZ (MISCELLANEOUS) ×2 IMPLANT
SPONGE T-LAP 18X18 ~~LOC~~+RFID (SPONGE) ×5 IMPLANT
SPONGE T-LAP 4X18 ~~LOC~~+RFID (SPONGE) ×1 IMPLANT
SUPPORT HEART JANKE-BARRON (MISCELLANEOUS) ×5 IMPLANT
SUT BONE WAX W31G (SUTURE) ×5 IMPLANT
SUT MNCRL AB 4-0 PS2 18 (SUTURE) ×1 IMPLANT
SUT PROLENE 3 0 SH DA (SUTURE) ×5 IMPLANT
SUT PROLENE 4 0 RB 1 (SUTURE) ×10
SUT PROLENE 4 0 SH DA (SUTURE) IMPLANT
SUT PROLENE 4-0 RB1 .5 CRCL 36 (SUTURE) IMPLANT
SUT PROLENE 6 0 C 1 30 (SUTURE) ×11 IMPLANT
SUT PROLENE 7 0 BV1 MDA (SUTURE) ×6 IMPLANT
SUT PROLENE 8 0 BV175 6 (SUTURE) ×1 IMPLANT
SUT STEEL 6MS V (SUTURE) ×5 IMPLANT
SUT STEEL STERNAL CCS#1 18IN (SUTURE) IMPLANT
SUT STEEL SZ 6 DBL 3X14 BALL (SUTURE) ×5 IMPLANT
SUT VIC AB 1 CTX 36 (SUTURE) ×10
SUT VIC AB 1 CTX36XBRD ANBCTR (SUTURE) ×8 IMPLANT
SUT VIC AB 2-0 CT1 27 (SUTURE) ×5
SUT VIC AB 2-0 CT1 TAPERPNT 27 (SUTURE) IMPLANT
SUT VIC AB 2-0 CTX 27 (SUTURE) IMPLANT
SUT VIC AB 3-0 SH 27 (SUTURE)
SUT VIC AB 3-0 SH 27X BRD (SUTURE) IMPLANT
SUT VIC AB 3-0 X1 27 (SUTURE) IMPLANT
SUT VICRYL 4-0 PS2 18IN ABS (SUTURE) IMPLANT
SYSTEM SAHARA CHEST DRAIN ATS (WOUND CARE) ×5 IMPLANT
TAPE CLOTH SURG 4X10 WHT LF (GAUZE/BANDAGES/DRESSINGS) ×1 IMPLANT
TAPE PAPER 2X10 WHT MICROPORE (GAUZE/BANDAGES/DRESSINGS) ×1 IMPLANT
TOWEL GREEN STERILE (TOWEL DISPOSABLE) ×5 IMPLANT
TOWEL GREEN STERILE FF (TOWEL DISPOSABLE) ×5 IMPLANT
TRAY FOLEY SLVR 16FR TEMP STAT (SET/KITS/TRAYS/PACK) ×5 IMPLANT
TUBING LAP HI FLOW INSUFFLATIO (TUBING) ×5 IMPLANT
UNDERPAD 30X36 HEAVY ABSORB (UNDERPADS AND DIAPERS) ×5 IMPLANT
WATER STERILE IRR 1000ML POUR (IV SOLUTION) ×10 IMPLANT

## 2021-08-29 NOTE — Anesthesia Procedure Notes (Signed)
Procedure Name: Intubation Date/Time: 08/29/2021 8:41 AM Performed by: Rande Brunt, CRNA Pre-anesthesia Checklist: Patient identified, Emergency Drugs available, Suction available and Patient being monitored Patient Re-evaluated:Patient Re-evaluated prior to induction Oxygen Delivery Method: Circle System Utilized Preoxygenation: Pre-oxygenation with 100% oxygen Induction Type: IV induction Ventilation: Mask ventilation without difficulty Laryngoscope Size: 4 and Mac Grade View: Grade II Tube type: Oral Tube size: 8.0 mm Number of attempts: 1 Airway Equipment and Method: Stylet and Oral airway Placement Confirmation: ETT inserted through vocal cords under direct vision, positive ETCO2 and breath sounds checked- equal and bilateral Secured at: 23 cm Tube secured with: Tape Dental Injury: Teeth and Oropharynx as per pre-operative assessment  Comments: Placed by Orland Mustard SRNA

## 2021-08-29 NOTE — Anesthesia Procedure Notes (Addendum)
Arterial Line Insertion Start/End11/11/2020 8:02 AM, 08/29/2021 8:05 AM Performed by: Rande Brunt, CRNA, CRNA  Preanesthetic checklist: patient identified, IV checked, site marked, risks and benefits discussed, surgical consent, monitors and equipment checked, pre-op evaluation, timeout performed and anesthesia consent Lidocaine 1% used for infiltration and patient sedated Left, radial was placed Catheter size: 20 G Hand hygiene performed  and maximum sterile barriers used   Attempts: 1 Procedure performed without using ultrasound guided technique. Following insertion, dressing applied and Biopatch. Post procedure assessment: normal and unchanged  Additional procedure comments: Placed by Tonna Corner.

## 2021-08-29 NOTE — Progress Notes (Signed)
ANTICOAGULATION CONSULT NOTE   Pharmacy Consult for heparin Indication: chest pain/ACS  Allergies  Allergen Reactions   Ciprofloxacin     Hives post op   Ditropan [Oxybutynin Chloride]     Hives 2011, post op   Codeine     Nausea and headaches     Patient Measurements: Height: 5\' 11"  (180.3 cm) Weight: 96.2 kg (212 lb) IBW/kg (Calculated) : 75.3 Heparin Dosing Weight: 93kg  Vital Signs: Temp: 99.1 F (37.3 C) (11/02 0440) Temp Source: Oral (11/02 0440) BP: 125/74 (11/02 0440) Pulse Rate: 54 (11/02 0440)  Labs: Recent Labs    08/26/21 0944 08/27/21 0233 08/27/21 0233 08/28/21 0302 08/29/21 0521  HGB  --  13.0   < > 12.9* 13.6  HCT  --  39.0  --  37.9* 40.4  PLT  --  220  --  208 218  APTT  --  46*  --   --   --   LABPROT  --  13.1  --   --   --   INR  --  1.0  --   --   --   HEPARINUNFRC  --  0.30  --  0.53 0.59  CREATININE 0.86  --   --  1.08 0.89   < > = values in this interval not displayed.     Estimated Creatinine Clearance: 84.9 mL/min (by C-G formula based on SCr of 0.89 mg/dL).   Medical History: Past Medical History:  Diagnosis Date   Elevated blood-pressure reading without diagnosis of hypertension    Other and unspecified hyperlipidemia    Prostate cancer ([place]) 2011   Vitamin D deficiency      Assessment: 28 YOM presenting with CP, elevated troponin, he is not on anticoagulation PTA, CBC wnl. Pt s/p LHC w/ mvCAD, planning for coronary bypass. Heparin resumed postcath.  Heparin level therapeutic at 0.59, CBC stable.  Goal of Therapy:  Heparin level 0.3-0.7 units/ml Monitor platelets by anticoagulation protocol: Yes   Plan:  Continue heparin 1600 units/h Daily heparin level and CBC   Pauletta Browns, Pharm.D. PGY-1 Pharmacy Resident AXKPV:[phone] 08/29/2021 7:09 AM

## 2021-08-29 NOTE — Interval H&P Note (Signed)
History and Physical Interval Note:  08/29/2021 8:05 AM  Kyle Strickland  has presented today for surgery, with the diagnosis of CAD.  The various methods of treatment have been discussed with the patient and family. After consideration of risks, benefits and other options for treatment, the patient has consented to  Procedure(s): CORONARY ARTERY BYPASS GRAFTING (CABG) (N/A) TRANSESOPHAGEAL ECHOCARDIOGRAM (TEE) (N/A) as a surgical intervention.  The patient's history has been reviewed, patient examined, no change in status, stable for surgery.  I have reviewed the patient's chart and labs.  Questions were answered to the patient's satisfaction.     Melrose Nakayama

## 2021-08-29 NOTE — Transfer of Care (Signed)
Immediate Anesthesia Transfer of Care Note  Patient: Kyle Strickland  Procedure(s) Performed: CORONARY ARTERY BYPASS GRAFTING (CABG)x 4 ON CARDIOPULMONARY BYPASS. LIMA TO LAD, SVG TO OM1, SVG TO PDA-OM2 SEQ. (Chest) TRANSESOPHAGEAL ECHOCARDIOGRAM (TEE) ENDOVEIN HARVEST OF GREATER SAPHENOUS VEIN (Right) APPLICATION OF CELL SAVER  Patient Location: SICU  Anesthesia Type:General  Level of Consciousness: Patient remains intubated per anesthesia plan  Airway & Oxygen Therapy: Patient remains intubated per anesthesia plan and Patient placed on Ventilator (see vital sign flow sheet for setting)  Post-op Assessment: Report given to RN and Post -op Vital signs reviewed and stable  Post vital signs: Reviewed and stable  Last Vitals:  Vitals Value Taken Time  BP    Temp 35.9 C 08/29/21 1355  Pulse 80 08/29/21 1355  Resp 12 08/29/21 1355  SpO2 100 % 08/29/21 1355  Vitals shown include unvalidated device data.  Last Pain:  Vitals:   08/29/21 0757  TempSrc: Oral  PainSc:       Patients Stated Pain Goal: 0 (19/69/40 9828)  Complications: No notable events documented.

## 2021-08-29 NOTE — Progress Notes (Signed)
RT tried to obtain abg not able pt. Refused to try again.

## 2021-08-29 NOTE — Anesthesia Postprocedure Evaluation (Signed)
Anesthesia Post Note  Patient: Kyle Strickland  Procedure(s) Performed: CORONARY ARTERY BYPASS GRAFTING (CABG)x 4 ON CARDIOPULMONARY BYPASS. LIMA TO LAD, SVG TO OM1, SVG TO PDA-OM2 SEQ. (Chest) TRANSESOPHAGEAL ECHOCARDIOGRAM (TEE) ENDOVEIN HARVEST OF GREATER SAPHENOUS VEIN (Right) APPLICATION OF CELL SAVER     Patient location during evaluation: SICU Anesthesia Type: General Level of consciousness: sedated Pain management: pain level controlled Vital Signs Assessment: post-procedure vital signs reviewed and stable Respiratory status: patient remains intubated per anesthesia plan Cardiovascular status: stable Postop Assessment: no apparent nausea or vomiting Anesthetic complications: no   No notable events documented.  Last Vitals:  Vitals:   08/29/21 1445 08/29/21 1500  BP:  112/75  Pulse: 80 82  Resp: 14 13  Temp: (!) 35.8 C (!) 35.8 C  SpO2: 100% 100%    Last Pain:  Vitals:   08/29/21 1400  TempSrc: Core  PainSc:                  Tiajuana Amass

## 2021-08-29 NOTE — Procedures (Signed)
Extubation Procedure Note  Patient Details:   Name: Kyle Strickland DOB: 1945-12-24 MRN: [id number]   Airway Documentation:    Vent end date: 08/29/21 Vent end time: 1703   Evaluation  O2 sats: stable throughout Complications: No apparent complications Patient did tolerate procedure well. Bilateral Breath Sounds: Clear, Diminished   Yes, pt could speak post extubation.  Pt extubated to 4 l/m [place] per protocol.  Earney Navy 08/29/2021, 5:04 PM

## 2021-08-29 NOTE — Anesthesia Procedure Notes (Signed)
Central Venous Catheter Insertion Performed by: Duane Boston, MD, anesthesiologist Start/End11/11/2020 8:07 AM, 08/29/2021 8:17 AM Patient location: Pre-op. Preanesthetic checklist: patient identified, IV checked, site marked, risks and benefits discussed, surgical consent, monitors and equipment checked, pre-op evaluation, timeout performed and anesthesia consent Position: Trendelenburg Lidocaine 1% used for infiltration and patient sedated Hand hygiene performed , maximum sterile barriers used  and Seldinger technique used Catheter size: 8.5 Fr Total catheter length 8. PA cath was placed.Sheath introducer Swan type:thermodilution PA Cath depth:50 Procedure performed using ultrasound guided technique. Ultrasound Notes:anatomy identified, needle tip was noted to be adjacent to the nerve/plexus identified, no ultrasound evidence of intravascular and/or intraneural injection and image(s) printed for medical record Attempts: 1 Following insertion, line sutured and dressing applied. Post procedure assessment: free fluid flow, blood return through all ports and no air  Patient tolerated the procedure well with no immediate complications.

## 2021-08-29 NOTE — Brief Op Note (Signed)
08/23/2021 - 08/29/2021  12:32 PM  PATIENT:  Kyle Strickland  75 y.o. male  PRE-OPERATIVE DIAGNOSIS:  CAD  POST-OPERATIVE DIAGNOSIS:  CAD  PROCEDURE:  CORONARY ARTERY BYPASS GRAFTING (CABG)x 4 ON CARDIOPULMONARY BYPASS. LIMA TO LAD, SVG TO OM1, SVG TO PDA-OM2 SEQ.  TRANSESOPHAGEAL ECHOCARDIOGRAM (TEE) (N/A) ENDOVEIN HARVEST OF GREATER SAPHENOUS VEIN (Right) APPLICATION OF CELL SAVER  SURGEON:  Melrose Nakayama, MD - Primary  PHYSICIAN ASSISTANT: Marica Trentham  ASSISTANTS: Dineen Kid, RN, RN First Assistant   ANESTHESIA:   general  EBL:  Per anesthesia and perfusion records   BLOOD ADMINISTERED:none  DRAINS:  Mediastinal and perfusion records    LOCAL MEDICATIONS USED:  NONE  SPECIMEN:  No Specimen  DISPOSITION OF SPECIMEN:  N/A  COUNTS:  YES  DICTATION: .Dragon Dictation  PLAN OF CARE: Admit to inpatient   PATIENT DISPOSITION:  ICU - intubated and hemodynamically stable.   Delay start of Pharmacological VTE agent (>24hrs) due to surgical blood loss or risk of bleeding: yes

## 2021-08-29 NOTE — Anesthesia Preprocedure Evaluation (Addendum)
Anesthesia Evaluation  Patient identified by MRN, date of birth, ID band Patient awake    Reviewed: Allergy & Precautions, NPO status , Patient's Chart, lab work & pertinent test results  Airway Mallampati: III  TM Distance: <3 FB Neck ROM: Full    Dental  (+) Dental Advisory Given   Pulmonary neg pulmonary ROS,    breath sounds clear to auscultation       Cardiovascular hypertension, + Past MI  + dysrhythmias  Rhythm:Regular Rate:Normal     Neuro/Psych negative neurological ROS     GI/Hepatic negative GI ROS, Neg liver ROS,   Endo/Other  negative endocrine ROS  Renal/GU negative Renal ROS     Musculoskeletal   Abdominal   Peds  Hematology negative hematology ROS (+)   Anesthesia Other Findings   Reproductive/Obstetrics                            Lab Results  Component Value Date   WBC 11.3 (H) 08/29/2021   HGB 13.6 08/29/2021   HCT 40.4 08/29/2021   MCV 93.5 08/29/2021   PLT 218 08/29/2021   Lab Results  Component Value Date   CREATININE 0.89 08/29/2021   BUN 15 08/29/2021   NA 136 08/29/2021   K 3.9 08/29/2021   CL 105 08/29/2021   CO2 23 08/29/2021    Anesthesia Physical Anesthesia Plan  ASA: 4  Anesthesia Plan: General   Post-op Pain Management:    Induction: Intravenous  PONV Risk Score and Plan: 2 and Treatment may vary due to age or medical condition  Airway Management Planned: Oral ETT  Additional Equipment: Arterial line, CVP, PA Cath, TEE and Ultrasound Guidance Line Placement  Intra-op Plan:   Post-operative Plan: Post-operative intubation/ventilation  Informed Consent: I have reviewed the patients History and Physical, chart, labs and discussed the procedure including the risks, benefits and alternatives for the proposed anesthesia with the patient or authorized representative who has indicated his/her understanding and acceptance.       Plan  Discussed with:   Anesthesia Plan Comments:         Anesthesia Quick Evaluation

## 2021-08-29 NOTE — Progress Notes (Signed)
      [address]       [place] [address]             [phone]      S/p CABG x 4 Extubated  BP 101/71 (BP Location: Right Arm)   Pulse 80   Temp (!) 97.2 F (36.2 C)   Resp 14   Ht 5\' 11"  (1.803 m)   Wt 96.2 kg   SpO2 100%   BMI 29.58 kg/m   Intake/Output Summary (Last 24 hours) at 08/29/2021 1720 Last data filed at 08/29/2021 1700 Gross per 24 hour  Intake 4360.97 ml  Output 2680 ml  Net 1680.97 ml   Minimal CT output  Doing well early postop  Remo Lipps C. [name], MD [facility] [phone]

## 2021-08-29 NOTE — Progress Notes (Signed)
  Echocardiogram Echocardiogram Transesophageal has been performed.  Fidel Levy 08/29/2021, 9:34 AM

## 2021-08-29 NOTE — Hospital Course (Addendum)
Referring: [facility]: Colon Branch, MD Primary [place], MD  History of Present Illness:      Mr. Imhof is a 75 yo male with history of HTN and prostate cancer S/P Radical Prostatectomy.  He presented to [facility] [place] on 08/23/2021 with complaints of left arm pain and numbness.  The patient had been experiencing these episodes over the past week and he had experienced at least 5 separate episodes.  They would last about 45 seconds and the patient describes the episodes as a "blood pressure cuff squeezing on his arm."  The pain radiated into his chest and occur with exertion.  He denied associated N/V and diaphoresis.  He did experience some shortness of breath with these episodes.  Workup in the ED showed EKG with a right bundle branch block.  Troponin levels were also elevated.  Cardiology was consulted and they felt catheterization would be indicated.  The patient was placed on a Heparin drip and transferred to [facility] for further care.  He was chest pain free on arrival.  He was taken to the catheterization lab on 10/27 and was found to have a preserved EF and multivessel CAD.  Cardiology felt the patient would benefit from coronary bypass grafting procedure.  [facility] was requested.  The patient has remained chest pain free.  He is active without too much issue until recently.  He does admit to have decreased energy and his wife states he falls asleep quite frequently during the day at just 15 min intervals.   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  He has already experienced one on the Heparin.  He is nervous/afraid about surgery.  His wife is having some health issues his daughter recently completed breast cancer treatment and they are trying to figure out how to juggle everything.  Hospital Course: Coronary bypass grafting was  offered to Mr. Ben and he elected to proceed.  He was taken to the operative room on 08/29/2021 where four-vessel coronary bypass grafting was accomplished.  The left internal mammary artery was grafted to the left anterior descending coronary artery.  A sequential saphenous vein graft was placed to the posterior descending coronary artery into the OM 2.  A separate saphenous vein graft was placed to OM1.  Following the procedure, he separated from cardiopulmonary bypass without difficulty.  He was started on Plavix and ec asa was decreased to 81 mg daily as he had a NSTEMI on admission. He was transferred to the surgical ICU in stable condition.  He was extubated the evening of surgery.  The patient did not require inotropic support post operatively.  His chest tubes and arterial lines were removed without difficulty.  The patient was started on gentle diuretics for volume overloaded state.  He was felt medically stable for transfer to the progressive care unit on postop day 2 but a bed did not become available.  We will continue to use postop management and rehab in the ICU.  He was being prepared for discharge on postop day 4 when he developed atrial fibrillation.  This was treated with metoprolol and amiodarone resulting in conversion back to sinus rhythm.  The intravenous amiodarone was transitioned to oral dosing and he continues to maintain sinus rhythm. He did have some episodes of a fib (with controlled rate). As per cardiology recommendation, Plavix stopped and Apixaban 5 mg  bid started. He has been ambulating on room air with good oxygenation. He has been tolerating a diet and had a bowel movement. Wounds are clean, dry, and healing without signs of infection. Chest tube sutures were removed on 11/08. He will be continued on daily Lasix for 5 more days for volume overload. As discussed with Dr. [name], he is stable for discharge.

## 2021-08-30 ENCOUNTER — Inpatient Hospital Stay (HOSPITAL_COMMUNITY): Payer: Medicare Other

## 2021-08-30 ENCOUNTER — Encounter (HOSPITAL_COMMUNITY): Payer: Self-pay | Admitting: Thoracic Surgery (Cardiothoracic Vascular Surgery)

## 2021-08-30 DIAGNOSIS — Z951 Presence of aortocoronary bypass graft: Secondary | ICD-10-CM

## 2021-08-30 LAB — BASIC METABOLIC PANEL
Anion gap: 3 — ABNORMAL LOW (ref 5–15)
Anion gap: 6 (ref 5–15)
BUN: 15 mg/dL (ref 8–23)
BUN: 18 mg/dL (ref 8–23)
CO2: 23 mmol/L (ref 22–32)
CO2: 23 mmol/L (ref 22–32)
Calcium: 8.2 mg/dL — ABNORMAL LOW (ref 8.9–10.3)
Calcium: 8.5 mg/dL — ABNORMAL LOW (ref 8.9–10.3)
Chloride: 108 mmol/L (ref 98–111)
Chloride: 103 mmol/L (ref 98–111)
Creatinine, Ser: 0.81 mg/dL (ref 0.61–1.24)
Creatinine, Ser: 0.87 mg/dL (ref 0.61–1.24)
GFR, Estimated: >60 mL/min (ref >60)
GFR, Estimated: >60 mL/min (ref >60)
Glucose, Bld: 126 mg/dL — ABNORMAL HIGH (ref 70–99)
Glucose, Bld: 169 mg/dL — ABNORMAL HIGH (ref 70–99)
Potassium: 4.3 mmol/L (ref 3.5–5.1)
Potassium: 4.1 mmol/L (ref 3.5–5.1)
Sodium: 134 mmol/L — ABNORMAL LOW (ref 135–145)
Sodium: 132 mmol/L — ABNORMAL LOW (ref 135–145)

## 2021-08-30 LAB — CBC
HCT: 32.6 % — ABNORMAL LOW (ref 39.0–52.0)
HCT: 33.7 % — ABNORMAL LOW (ref 39.0–52.0)
Hemoglobin: 11 g/dL — ABNORMAL LOW (ref 13.0–17.0)
Hemoglobin: 11.1 g/dL — ABNORMAL LOW (ref 13.0–17.0)
MCH: 31.8 pg (ref 26.0–34.0)
MCH: 31.9 pg (ref 26.0–34.0)
MCHC: 33.7 g/dL (ref 30.0–36.0)
MCHC: 32.9 g/dL (ref 30.0–36.0)
MCV: 94.2 fL (ref 80.0–100.0)
MCV: 96.8 fL (ref 80.0–100.0)
Platelets: 177 10*3/uL (ref 150–400)
Platelets: 163 10*3/uL (ref 150–400)
RBC: 3.46 MIL/uL — ABNORMAL LOW (ref 4.22–5.81)
RBC: 3.48 MIL/uL — ABNORMAL LOW (ref 4.22–5.81)
RDW: 12.6 % (ref 11.5–15.5)
RDW: 13.1 % (ref 11.5–15.5)
WBC: 21.7 10*3/uL — ABNORMAL HIGH (ref 4.0–10.5)
WBC: 21 10*3/uL — ABNORMAL HIGH (ref 4.0–10.5)
nRBC: 0 % (ref 0.0–0.2)
nRBC: 0 % (ref 0.0–0.2)

## 2021-08-30 LAB — GLUCOSE, CAPILLARY
Glucose-Capillary: 120 mg/dL — ABNORMAL HIGH (ref 70–99)
Glucose-Capillary: 129 mg/dL — ABNORMAL HIGH (ref 70–99)
Glucose-Capillary: 131 mg/dL — ABNORMAL HIGH (ref 70–99)
Glucose-Capillary: 135 mg/dL — ABNORMAL HIGH (ref 70–99)
Glucose-Capillary: 138 mg/dL — ABNORMAL HIGH (ref 70–99)
Glucose-Capillary: 141 mg/dL — ABNORMAL HIGH (ref 70–99)
Glucose-Capillary: 143 mg/dL — ABNORMAL HIGH (ref 70–99)
Glucose-Capillary: 146 mg/dL — ABNORMAL HIGH (ref 70–99)
Glucose-Capillary: 146 mg/dL — ABNORMAL HIGH (ref 70–99)
Glucose-Capillary: 158 mg/dL — ABNORMAL HIGH (ref 70–99)

## 2021-08-30 LAB — MAGNESIUM
Magnesium: 2.5 mg/dL — ABNORMAL HIGH (ref 1.7–2.4)
Magnesium: 2.3 mg/dL (ref 1.7–2.4)

## 2021-08-30 MED ORDER — INSULIN ASPART 100 UNIT/ML IJ SOLN
0.0000 [IU] | INTRAMUSCULAR | Status: DC
Start: 2021-08-30 — End: 2021-08-31
  Administered 2021-08-30 – 2021-08-31 (×4): 2 [IU] via SUBCUTANEOUS

## 2021-08-30 MED ORDER — INSULIN DETEMIR 100 UNIT/ML ~~LOC~~ SOLN
25.0000 [IU] | Freq: Two times a day (BID) | SUBCUTANEOUS | Status: DC
  Administered 2021-08-30 (×2): 25 [IU] via SUBCUTANEOUS
  Filled 2021-08-30 (×4): qty 0.25

## 2021-08-30 MED ORDER — ENOXAPARIN SODIUM 40 MG/0.4ML IJ SOSY
40.0000 mg | PREFILLED_SYRINGE | Freq: Every day | INTRAMUSCULAR | Status: DC
  Administered 2021-08-30 – 2021-09-03 (×4): 40 mg via SUBCUTANEOUS
  Filled 2021-08-30 (×5): qty 0.4

## 2021-08-30 MED ORDER — OMEGA-3-ACID ETHYL ESTERS 1 G PO CAPS
1.0000 g | ORAL_CAPSULE | Freq: Every day | ORAL | Status: DC
Start: 2021-08-30 — End: 2021-09-04
  Administered 2021-08-30 – 2021-09-04 (×6): 1 g via ORAL
  Filled 2021-08-30 (×6): qty 1

## 2021-08-30 MED ORDER — MAGNESIUM OXIDE -MG SUPPLEMENT 400 (240 MG) MG PO TABS
400.0000 mg | ORAL_TABLET | Freq: Every day | ORAL | Status: DC
  Administered 2021-08-30 – 2021-09-04 (×6): 400 mg via ORAL
  Filled 2021-08-30 (×6): qty 1

## 2021-08-30 NOTE — Progress Notes (Signed)
Sitting and reading. Pain from pulling tubes is decreasing. No atrial fib. Neuro intact. Doing well 1 days post op.

## 2021-08-30 NOTE — Progress Notes (Signed)
1 Day Post-Op Procedure(s) (LRB): CORONARY ARTERY BYPASS GRAFTING (CABG)x 4 ON CARDIOPULMONARY BYPASS. LIMA TO LAD, SVG TO OM1, SVG TO PDA-OM2 SEQ. (N/A) TRANSESOPHAGEAL ECHOCARDIOGRAM (TEE) (N/A) ENDOVEIN HARVEST OF GREATER SAPHENOUS VEIN (Right) APPLICATION OF CELL SAVER Subjective: Some incisional discomfort- less than he expected  Objective: Vital signs in last 24 hours: Temp:  [96.4 F (35.8 C)-99.7 F (37.6 C)] 99 F (37.2 C) (11/03 0800) Pulse Rate:  [80-84] 82 (11/03 0800) Cardiac Rhythm: Atrial paced (11/03 0800) Resp:  [11-24] 13 (11/03 0800) BP: (88-113)/(58-75) 113/63 (11/03 0800) SpO2:  [95 %-100 %] 96 % (11/03 0800) FiO2 (%):  [36 %-50 %] 36 % (11/02 1704) Weight:  [97.2 kg] 97.2 kg (11/03 0500)  Hemodynamic parameters for last 24 hours: PAP: (18-42)/(8-24) 30/14 CO:  [3.9 L/min-6.4 L/min] 6.4 L/min CI:  [1.8 L/min/m2-3 L/min/m2] 3 L/min/m2  Intake/Output from previous day: 11/02 0701 - 11/03 0700 In: 4978.5 [I.V.:3361.9; Blood:515; IV Piggyback:1101.6] Out: 2500 [Urine:1460; Blood:835; Chest Tube:205] Intake/Output this shift: Total I/O In: 167.5 [I.V.:67.5; IV Piggyback:100] Out: 70 [Urine:60; Chest Tube:10]  General appearance: alert, cooperative, and no distress Neurologic: intact Heart: regular rate and rhythm Lungs: diminished breath sounds bibasilar Abdomen: normal findings: soft, non-tender  Lab Results: Recent Labs    08/29/21 1754 08/29/21 1803 08/30/21 0411  WBC 23.4*  --  21.7*  HGB 11.1* 10.5* 11.0*  HCT 32.7* 31.0* 32.6*  PLT 171  --  177   BMET:  Recent Labs    08/29/21 1754 08/29/21 1803 08/30/21 0411  NA 136 137 134*  K 4.3 4.3 4.3  CL 109  --  108  CO2 23  --  23  GLUCOSE 158*  --  126*  BUN 15  --  15  CREATININE 0.80  --  0.81  CALCIUM 8.2*  --  8.2*    PT/INR:  Recent Labs    08/29/21 1400  LABPROT 16.0*  INR 1.3*   ABG    Component Value Date/Time   PHART 7.352 08/29/2021 1803   HCO3 22.1 08/29/2021  1803   TCO2 23 08/29/2021 1803   ACIDBASEDEF 3.0 (H) 08/29/2021 1803   O2SAT 98.0 08/29/2021 1803   CBG (last 3)  Recent Labs    08/30/21 0405 08/30/21 0527 08/30/21 0600  GLUCAP 131* 120* 129*    Assessment/Plan: S/P Procedure(s) (LRB): CORONARY ARTERY BYPASS GRAFTING (CABG)x 4 ON CARDIOPULMONARY BYPASS. LIMA TO LAD, SVG TO OM1, SVG TO PDA-OM2 SEQ. (N/A) TRANSESOPHAGEAL ECHOCARDIOGRAM (TEE) (N/A) ENDOVEIN HARVEST OF GREATER SAPHENOUS VEIN (Right) APPLICATION OF CELL SAVER POD # 1 Looks great NEURO- intact CV- in SR, good hemodynamics  DC Swan and A line  ASA, statin, beta blocker RESP- IS for atelectasis RENAL- creatinine and lytes OK ENDO- CBG well controlled GI- advance diet Anemia secondary to ABL- mild, follow SCD + enoxaparin Dc chest tubes Mobilize   LOS: 7 days    Melrose Nakayama 08/30/2021

## 2021-08-30 NOTE — Progress Notes (Signed)
Patient ID: Kyle Strickland, male   DOB: [birth year], 75 y.o.   MRN: [id number] TCTS Evening Rounds:  Hemodynamically stable in sinus rhythm.  Urine output ok.  CBC    Component Value Date/Time   WBC 21.0 (H) 08/30/2021 1703   RBC 3.48 (L) 08/30/2021 1703   HGB 11.1 (L) 08/30/2021 1703   HCT 33.7 (L) 08/30/2021 1703   PLT 163 08/30/2021 1703   MCV 96.8 08/30/2021 1703   MCH 31.9 08/30/2021 1703   MCHC 32.9 08/30/2021 1703   RDW 13.1 08/30/2021 1703   LYMPHSABS 1.2 10/23/2020 1146   MONOABS 0.8 10/23/2020 1146   EOSABS 0.2 10/23/2020 1146   BASOSABS 0.0 10/23/2020 1146   BMET pending this evening.  Sitting up watching TV. No problems today.

## 2021-08-30 NOTE — Discharge Summary (Signed)
[address]       [place] [address]             [phone]    Physician Discharge Summary  Patient ID: Kyle Strickland MRN: [id number] DOB/AGE: October 19, 1946 75 y.o.  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   ELEVATED BLOOD PRESSURE WITHOUT DIAGNOSIS OF HYPERTENSION 01/29/2010   Discharge Diagnoses:  Patient Active Problem List   Diagnosis Date Noted   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   NSTEMI (non-ST elevated myocardial infarction) ([place]) 08/23/2021   PCP NOTES >>>>>>>>>> 11/09/2020   Blepharitis of right upper eyelid 03/05/2019   Vitamin D deficiency 07/14/2018   Annual physical exam 12/06/2016   Obesity 12/06/2016   RBBB (right bundle branch block) 05/26/2015   Prostate cancer ([place]) 10/01/2011   Hyperlipidemia 10/01/2011   HEARING LOSS, BILATERAL 01/29/2010   ELEVATED BLOOD PRESSURE WITHOUT DIAGNOSIS OF HYPERTENSION 01/29/2010   Discharged Condition: stable  History of Present Illness:      Kyle Strickland is a 75 yo male with history of HTN and prostate cancer S/P Radical Prostatectomy.  He presented to [facility] [place] on 08/23/2021 with complaints of left arm pain and numbness.  The patient had been experiencing these episodes over the past week and he had experienced at least 5 separate episodes.  They would last about 45 seconds and the patient describes the episodes as a "blood pressure cuff squeezing on his arm."  The pain radiated into his chest and occur with exertion.  He  denied associated N/V and diaphoresis.  He did experience some shortness of breath with these episodes.  Workup in the ED showed EKG with a right bundle branch block.  Troponin levels were also elevated.  Cardiology was consulted and they felt catheterization would be indicated.  The patient was placed on a Heparin drip and transferred to [facility] for further care.  He was chest pain free on arrival.  He was taken to the catheterization lab on 10/27 and was found to have a preserved EF and multivessel CAD.  Cardiology felt the patient would benefit from coronary bypass grafting procedure.  [facility] was requested.  The patient has remained chest pain free.  He is active without too much issue until recently.  He does admit to have decreased energy and his wife states he falls asleep quite frequently during the day at just 15 min intervals.   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  He has already experienced one on the Heparin.  He is nervous/afraid about surgery.  His wife is having some health issues his daughter recently completed breast cancer treatment and they are trying to figure out how to juggle everything.  Hospital Course: Coronary bypass grafting was offered to Kyle Strickland and he elected to proceed.  He was taken to the operative room on 08/29/2021 where four-vessel coronary bypass grafting was accomplished.  The left internal  mammary artery was grafted to the left anterior descending coronary artery.  A sequential saphenous vein graft was placed to the posterior descending coronary artery into the OM 2.  A separate saphenous vein graft was placed to OM1.  Following the procedure, he separated from cardiopulmonary bypass without difficulty.  He was transferred to the surgical ICU in stable condition.  He was extubated the evening of surgery.  The patient did not require inotropic support  post operatively.  His chest tubes and arterial lines were removed without difficulty.  The patient was started on gentle diuretics for volume overloaded state.  He was felt medically stable for transfer to the progressive care unit on postop day 2 but a bed did not become available.  We will continue to use postop management and rehab in the ICU.  He was being prepared for discharge on postop day 4 when he developed atrial fibrillation.  This was treated with metoprolol and amiodarone resulting in conversion back to sinus rhythm.  Consults: cardiology  Significant Diagnostic Studies: angiography:      Mid RCA lesion is 100% stenosed.   Mid LAD lesion is 95% stenosed.   Prox LAD to Mid LAD lesion is 75% stenosed.   1st Diag lesion is 70% stenosed.   Prox Cx lesion is 90% stenosed.   1st Mrg lesion is 75% stenosed.   The left ventricular systolic function is normal.   LV end diastolic pressure is normal.   The left ventricular ejection fraction is 55-65% by visual estimate.   There is no aortic valve stenosis.   Severe, complex, multivessel CAD.   Treatments: surgery  Operative Report    DATE OF PROCEDURE: 08/29/2021   PREOPERATIVE DIAGNOSIS:  Three-vessel coronary artery disease.   POSTOPERATIVE DIAGNOSIS:  Three-vessel coronary artery disease.   PROCEDURE:  Median sternotomy, extracorporeal circulation, coronary artery bypass grafting x4 (left internal mammary artery to LAD, saphenous vein graft to obtuse marginal 1, sequential saphenous vein graft to posterior descending and obtuse marginal 2.  Endoscopic vein harvest, right leg.   SURGEON:  Modesto Charon, MD   ASSISTANT:  Enid Cutter, PA-C   Discharge Exam: Blood pressure 121/63, pulse 69, temperature 98.4 F (36.9 C), temperature source Oral, resp. rate 15, height 5\' 11"  (1.803 m), weight 98 kg, SpO2 98 %. Cardiovascular: RRR Pulmonary: Clear to auscultation bilaterally Abdomen: Soft, non tender, bowel sounds  present. Extremities: Mild bilateral lower extremity edema. Wounds: Clean and dry.  No erythema or signs of infection.   Discharge Medications:  The patient has been discharged on:   1.Beta Blocker:  Yes [ X  ]                              No   [   ]                              If No, reason:  2.Ace Inhibitor/ARB: Yes [   ]                                     No  [ x   ]  If No, reason:Labile BP. Will try to start after discharge when BP allows  3.Statin:   Yes [ x  ]                  No  [   ]                  If No, reason:  4.Ecasa:  Yes  [ x  ]                  No   [   ]                  If No, reason:  Patient had ACS upon admission:  yes  Plavix/P2Y12 inhibitor: Yes [   ]                                      No  [ x  ]. He was given a few days of Plavix but because of PAF, this was stopped and he was started on Apixaban     Discharge Instructions     Amb Referral to Cardiac Rehabilitation   Complete by: As directed    Diagnosis:  CABG NSTEMI     CABG X ___: 4   After initial evaluation and assessments completed: Virtual Based Care may be provided alone or in conjunction with Phase 2 Cardiac Rehab based on patient barriers.: Yes      Allergies as of 09/04/2021       Reactions   Ciprofloxacin    Hives post op   Ditropan [oxybutynin Chloride]    Hives 2011, post op   Codeine    Nausea and headaches         Medication List     TAKE these medications    amiodarone 400 MG tablet Commonly known as: PACERONE Take 1 tablet (400 mg total) by mouth 2 (two) times daily. For 12 days;then take 200 mg daily thereafter   apixaban 5 MG Tabs tablet Commonly known as: ELIQUIS Take 1 tablet (5 mg total) by mouth 2 (two) times daily.   aspirin EC 81 MG tablet Take 81 mg by mouth See admin instructions. 81mg  once daily for 5 days per week   atorvastatin 80 MG tablet Commonly known as: LIPITOR Take 1 tablet (80 mg total)  by mouth daily. Start taking on: September 05, 2021   cyanocobalamin 100 MCG tablet Take 100 mcg by mouth daily.   fish oil-omega-3 fatty acids 1000 MG capsule Take 1 capsule (1 g total) by mouth daily. Start taking on: October 04, 2021 What changed: These instructions start on October 04, 2021. If you are unsure what to do until then, ask your doctor or other care provider.   furosemide 40 MG tablet Commonly known as: LASIX Take 1 tablet (40 mg total) by mouth daily. For 5 days then stop. Start taking on: September 05, 2021   hydrocortisone cream 1 % Apply topically 2 (two) times daily.   Magnesium 400 MG Caps Take 400 mg by mouth daily.   metoprolol succinate 25 MG 24 hr tablet Commonly known as: TOPROL-XL Take 1 tablet (25 mg total) by mouth daily. Start taking on: September 05, 2021   MULTIVITAMINS PO Take 1 tablet by mouth daily.   potassium chloride SA 20 MEQ tablet Commonly known as: KLOR-CON Take 1 tablet (20 mEq total) by  mouth daily. For 5 days then stop. Start taking on: September 05, 2021   traMADol 50 MG tablet Commonly known as: ULTRAM Take 2 tablets (100 mg total) by mouth every 4 (four) hours as needed for moderate pain.   Vitamin D3 25 MCG (1000 UT) Caps Take 1,000 Units by mouth daily.               Durable Medical Equipment  (From admission, onward)           Start     Ordered   09/01/21 1519  For home use only DME Walker rolling  Once       Question Answer Comment  Walker: With [place] Wheels   Patient needs a walker to treat with the following condition Decreased functional mobility and endurance      09/01/21 1518            Follow-up Information     Imogene Burn, PA-C Follow up.   Specialty: Cardiology Why: Hospital follow-up with Cardiology scheduled for 09/26/2021 at 10:45am. Please arrive 15 minutes early for check-in. If this date/time does not work for you, please call our office to reschedule. Contact  information: [address] [place] [address] [phone]         Melrose Nakayama, MD Follow up on 09/25/2021.   Specialty: Cardiothoracic Surgery Why: Your appointment is at 10:45am. Please arrive 30 minutes early for a chest x-ray to be perfomed by [facility] located on the first floor of the same building. Contact information: [address] [place] [address] [phone]         Malka So R, [place]. Go on 09/13/2021.   Specialty: Cardiology Why: Appointment time is at 2:00 pm Contact information: [place] [address] [phone]                 Signed: Lars Pinks PA-C 09/04/2021, 10:59 AM

## 2021-08-30 NOTE — Op Note (Signed)
NAME: Kyle Strickland, Kyle Strickland MEDICAL RECORD NO: [id number] ACCOUNT NO: [id number] DATE OF BIRTH: August 20, 1946 FACILITY: [facility] LOCATION: [facility] PHYSICIAN: [name]. [name], MD  Operative Report   DATE OF PROCEDURE: 08/29/2021  PREOPERATIVE DIAGNOSIS:  Three-vessel coronary artery disease.  POSTOPERATIVE DIAGNOSIS:  Three-vessel coronary artery disease.  PROCEDURE:   Median sternotomy, extracorporeal circulation,  Coronary artery bypass grafting x 4  Left internal mammary artery to LAD,  Saphenous vein graft to obtuse marginal 1,  Sequential saphenous vein graft to posterior descending and obtuse marginal 2.  Endoscopic vein harvest right leg.  SURGEON:  Modesto Charon, MD  ASSISTANT:  Enid Cutter, PA   ANESTHESIA:  General.  FINDINGS: Preserved left ventricular function by transesophageal echocardiography. Mammary good quality, saphenous vein fair quality. LAD diffusely diseased, fair quality target. OM1 and OM2 good quality targets. Posterior descending small poor quality  target.  CLINICAL NOTE:  [name] is a 75 year old gentleman who presented with left arm pain and numbness.  He had been having accelerating symptoms.  He was found to have a right bundle branch block on EKG and his troponin levels were elevated.  He underwent  cardiac catheterization where he was found to have severe 3-vessel coronary artery disease.  He did have preserved left ventricular function.  He was advised to undergo coronary artery bypass grafting.  The indications, risks, benefits, and alternatives  were discussed in detail with the patient.  He understood and accepted the risks and agreed to proceed.  OPERATIVE NOTE:  [name] was brought to the preoperative holding area on 08/29/2021.  Anesthesia placed a central venous catheter and a Swan-Ganz catheter and also placed an arterial blood pressure monitoring line.  DESCRIPTION OF PROCEDURE:  He was taken to the operating room and anesthetized  and intubated.  A Foley catheter was placed.  Intravenous antibiotics were administered.  The chest, abdomen and legs were prepped and draped in the usual sterile fashion.   Dr. Suzette Battiest performed transesophageal echocardiography.  Please see his separately dictated note for full details of the procedure.  There was preserved left ventricular function and no significant valvular pathology.  A timeout was performed.  An incision was made in the medial aspect of the right leg at the level of the knee.  The greater saphenous vein was harvested from mid-calf to groin endoscopically. Simultaneously, a median sternotomy was performed and the left  internal mammary artery was harvested using standard technique. 2000 units of heparin was administered during the vessel harvest.  The remainder of the full heparin dose was given prior to opening the pericardium.  Sternal retractor was placed and was opened over time.  The pericardium was opened.  The ascending aorta was inspected.  It was of normal caliber with no significant atherosclerotic disease.  After confirming adequate anticoagulation with ACT  measurement, the aorta was cannulated via concentric 2-0 Ethibond pledgeted pursestring sutures.  A dual stage venous cannula was placed via a pursestring suture in the right atrial appendage.  Cardiopulmonary bypass was initiated.  Flows were maintained  per protocol.  The coronary arteries were inspected and anastomotic sites were chosen.  The diagonal was too small to graft.  The posterior descending was a small vessel, but was felt to be graftable.  The conduits were inspected and cut to length.  A  foam pad was placed in the pericardium to insulate the heart.  A temperature probe was placed in the myocardial septum and a cardioplegia cannula was placed in the ascending  aorta.  The aorta was cross clamped.  The left ventricle was emptied via the aortic root vent. Cardiac arrest was achieved with a  combination of cold antegrade blood cardioplegia and topical iced saline.  After achieving a complete diastolic arrest and  septal cooling to 10 degrees Celsius, the following distal anastomoses were performed.   First, a reversed saphenous vein graft was placed sequentially to the posterior descending branch of the right coronary and OM2.  This was done to allow use of only the acceptable quality vein because the vein became very small below the knee.  A  side-to-side anastomosis was performed to the posterior descending and end-to-side was done to OM2.  The posterior descending was a 1 mm poor quality target. OM2 was 1.5 mm good quality target.  Both anastomoses were performed with running 7-0 Prolene  sutures.  All anastomoses were probed proximally and distally at their completion.  Cardioplegia was administered down the graft and there was good flow and good hemostasis.  A reversed saphenous vein graft then was placed end-to-side to OM1.  This was 2 mm good quality target vessel.  The vein was of satisfactory quality.  It was anastomosed end-to-side with a running 7-0 Prolene suture.  Again, a probe passed easily proximally  and distally.  Cardioplegia was administered down the graft and there was good flow and good hemostasis.  Additional cardioplegia was also administered via the aortic root.  The left internal mammary artery was brought through a window in the pericardium.  The distal end was beveled. The mammary was a good quality vessel.  It was anastomosed end-to-side to the distal LAD.  The distal LAD was diffusely diseased, but did  accept a 1.5 mm probe, which passed to the apex.  It was a fair quality target vessel.  The end-to-side anastomosis was performed with a running 8-0 Prolene suture.  At the completion of the anastomosis, the bulldog clamp was briefly removed.  Rapid  septal rewarming was noted.  The bulldog clamp was replaced and the mammary pedicle was tacked to the epicardial  surface of the heart with 6-0 Prolene sutures.  Rewarming was begun.  Additional cardioplegia was administered.  The vein grafts were cut to length.  The cardioplegia cannula was removed from the ascending aorta.  The proximal vein graft anastomoses were performed to 4.0 mm punch aortotomies with running 6-0 Prolene  sutures.  At the completion of the final proximal anastomosis, the patient was placed in Trendelenburg position.  Lidocaine was administered.  The aortic root was de-aired and the aortic crossclamp was removed.  The total crossclamp time was 79 minutes.   The patient spontaneously defibrillated and did not require electrical cardioversion. While rewarming was completed, all proximal and distal anastomoses were inspected for hemostasis.  Epicardial pacing wires were placed on the right ventricle and right  atrium and atrial pacing was initiated at 80 beats per minute.  When the patient rewarmed to a core temperature of 36.5 degrees Celsius, he was weaned from cardiopulmonary bypass on the first attempt.  He did not require inotropic support.  Total bypass  time was 119 minutes.  The initial cardiac index was greater than 2 liters per minute per meter squared and the patient remained hemodynamically stable throughout the post-bypass period.  Post-bypass transesophageal echocardiography was unchanged from the  prebypass study.  A test dose of protamine was administered and was well tolerated.  The atrial and aortic cannulae were removed.  The remainder of the  protamine was administered without incident.  The chest was irrigated with warm saline.  Hemostasis was achieved.  The  pericardium was reapproximated over the aorta and base of the heart with interrupted 3-0 silk sutures.  Left pleural and mediastinal chest tubes were placed through separate subcostal incisions.  The sternum was closed with a combination of single and  double heavy-gauge stainless steel wires.  The pectoralis fascia,  subcutaneous tissue and skin were closed in standard fashion.  The leg incisions also were closed in standard fashion.  All sponge, needle and instrument counts were correct at the end of  the procedure. The patient was taken from the operating room to the surgical intensive care unit, intubated and in good condition.  Enid Cutter served as Licensed conveyancer for this case, harvesting the saphenous vein, providing retraction, exposure, suturing and other services as needed.   PAA D: 08/29/2021 6:18:09 pm T: 08/30/2021 2:27:00 am  JOB: [id number]/ [id number]

## 2021-08-31 ENCOUNTER — Inpatient Hospital Stay (HOSPITAL_COMMUNITY): Payer: Medicare Other

## 2021-08-31 LAB — BASIC METABOLIC PANEL
Anion gap: 4 — ABNORMAL LOW (ref 5–15)
BUN: 16 mg/dL (ref 8–23)
CO2: 27 mmol/L (ref 22–32)
Calcium: 8.3 mg/dL — ABNORMAL LOW (ref 8.9–10.3)
Chloride: 103 mmol/L (ref 98–111)
Creatinine, Ser: 0.91 mg/dL (ref 0.61–1.24)
GFR, Estimated: >60 mL/min (ref >60)
Glucose, Bld: 127 mg/dL — ABNORMAL HIGH (ref 70–99)
Potassium: 3.7 mmol/L (ref 3.5–5.1)
Sodium: 134 mmol/L — ABNORMAL LOW (ref 135–145)

## 2021-08-31 LAB — GLUCOSE, CAPILLARY
Glucose-Capillary: 121 mg/dL — ABNORMAL HIGH (ref 70–99)
Glucose-Capillary: 112 mg/dL — ABNORMAL HIGH (ref 70–99)
Glucose-Capillary: 145 mg/dL — ABNORMAL HIGH (ref 70–99)
Glucose-Capillary: 150 mg/dL — ABNORMAL HIGH (ref 70–99)
Glucose-Capillary: 133 mg/dL — ABNORMAL HIGH (ref 70–99)

## 2021-08-31 LAB — CBC
HCT: 30 % — ABNORMAL LOW (ref 39.0–52.0)
Hemoglobin: 10 g/dL — ABNORMAL LOW (ref 13.0–17.0)
MCH: 32.1 pg (ref 26.0–34.0)
MCHC: 33.3 g/dL (ref 30.0–36.0)
MCV: 96.2 fL (ref 80.0–100.0)
Platelets: 126 10*3/uL — ABNORMAL LOW (ref 150–400)
RBC: 3.12 MIL/uL — ABNORMAL LOW (ref 4.22–5.81)
RDW: 13.2 % (ref 11.5–15.5)
WBC: 16.5 10*3/uL — ABNORMAL HIGH (ref 4.0–10.5)
nRBC: 0 % (ref 0.0–0.2)

## 2021-08-31 MED ORDER — SODIUM CHLORIDE 0.9% FLUSH
3.0000 mL | Freq: Two times a day (BID) | INTRAVENOUS | Status: DC
  Administered 2021-08-31 – 2021-09-04 (×9): 3 mL via INTRAVENOUS

## 2021-08-31 MED ORDER — ~~LOC~~ CARDIAC SURGERY, PATIENT & FAMILY EDUCATION: Freq: Once | Status: AC

## 2021-08-31 MED ORDER — SODIUM CHLORIDE 0.9% FLUSH: 3.0000 mL | INTRAVENOUS | Status: DC | PRN

## 2021-08-31 MED ORDER — ZOLPIDEM TARTRATE 5 MG PO TABS
5.0000 mg | ORAL_TABLET | Freq: Every evening | ORAL | Status: DC | PRN
  Filled 2021-08-31: qty 1

## 2021-08-31 MED ORDER — POTASSIUM CHLORIDE CRYS ER 20 MEQ PO TBCR
20.0000 meq | EXTENDED_RELEASE_TABLET | ORAL | Status: AC
  Administered 2021-08-31 (×3): 20 meq via ORAL
  Filled 2021-08-31 (×3): qty 1

## 2021-08-31 MED ORDER — POTASSIUM CHLORIDE CRYS ER 20 MEQ PO TBCR
20.0000 meq | EXTENDED_RELEASE_TABLET | Freq: Every day | ORAL | Status: DC
  Administered 2021-09-01 – 2021-09-04 (×4): 20 meq via ORAL
  Filled 2021-08-31 (×4): qty 1

## 2021-08-31 MED ORDER — FUROSEMIDE 40 MG PO TABS
40.0000 mg | ORAL_TABLET | Freq: Every day | ORAL | Status: DC
  Administered 2021-08-31 – 2021-09-04 (×5): 40 mg via ORAL
  Filled 2021-08-31 (×5): qty 1

## 2021-08-31 MED ORDER — ALUM & MAG HYDROXIDE-SIMETH 200-200-20 MG/5ML PO SUSP: 15.0000 mL | Freq: Four times a day (QID) | ORAL | Status: DC | PRN

## 2021-08-31 MED ORDER — INSULIN ASPART 100 UNIT/ML IJ SOLN
0.0000 [IU] | Freq: Three times a day (TID) | INTRAMUSCULAR | Status: DC
  Administered 2021-08-31 – 2021-09-01 (×3): 2 [IU] via SUBCUTANEOUS

## 2021-08-31 MED ORDER — INSULIN ASPART 100 UNIT/ML IJ SOLN: 0.0000 [IU] | Freq: Every day | INTRAMUSCULAR | Status: DC

## 2021-08-31 MED ORDER — TRAMADOL HCL 50 MG PO TABS
100.0000 mg | ORAL_TABLET | ORAL | Status: DC | PRN
  Administered 2021-08-31 – 2021-09-03 (×8): 100 mg via ORAL
  Filled 2021-08-31 (×9): qty 2

## 2021-08-31 MED ORDER — SODIUM CHLORIDE 0.9 % IV SOLN: 250.0000 mL | INTRAVENOUS | Status: DC | PRN

## 2021-08-31 MED ORDER — MAGNESIUM HYDROXIDE 400 MG/5ML PO SUSP
30.0000 mL | Freq: Every day | ORAL | Status: DC | PRN
  Administered 2021-09-03 (×2): 30 mL via ORAL
  Filled 2021-08-31 (×3): qty 30

## 2021-08-31 MED ORDER — INSULIN DETEMIR 100 UNIT/ML ~~LOC~~ SOLN
25.0000 [IU] | Freq: Every day | SUBCUTANEOUS | Status: DC
  Administered 2021-08-31: 25 [IU] via SUBCUTANEOUS
  Filled 2021-08-31: qty 0.25

## 2021-08-31 MED FILL — Mannitol IV Soln 20%: INTRAVENOUS | Qty: 500 | Status: AC

## 2021-08-31 MED FILL — Lidocaine HCl (Cardiac) IV PF Soln 100 MG/5ML (2%): INTRAVENOUS | Qty: 5 | Status: AC

## 2021-08-31 MED FILL — Electrolyte-R (PH 7.4) Solution: INTRAVENOUS | Qty: 3000 | Status: AC

## 2021-08-31 MED FILL — Heparin Sodium (Porcine) Inj 1000 Unit/ML: INTRAMUSCULAR | Qty: 30 | Status: AC

## 2021-08-31 MED FILL — Sodium Bicarbonate IV Soln 8.4%: INTRAVENOUS | Qty: 50 | Status: AC

## 2021-08-31 MED FILL — Sodium Chloride IV Soln 0.9%: INTRAVENOUS | Qty: 2000 | Status: AC

## 2021-08-31 NOTE — Plan of Care (Signed)
  Problem: Activity: Goal: Ability to return to baseline activity level will improve Outcome: Progressing   Problem: Education: Goal: Knowledge of General Education information will improve Description: Including pain rating scale, medication(s)/side effects and non-pharmacologic comfort measures Outcome: Progressing   Problem: Cardiac: Goal: Will achieve and/or maintain hemodynamic stability Outcome: Progressing   Problem: Clinical Measurements: Goal: Postoperative complications will be avoided or minimized Outcome: Progressing

## 2021-08-31 NOTE — Discharge Instructions (Addendum)
Discharge Instructions:  1. You may shower, please wash incisions daily with soap and water and keep dry.  If you wish to cover wounds with dressing you may do so but please keep clean and change daily.  No tub baths or swimming until incisions have completely healed.  If your incisions become red or develop any drainage please call our office at [phone]  2. No Driving until cleared by [facility]'s office and you are no longer using narcotic pain medications  3. Monitor your weight daily. Please use the same scale and weigh at same time. If you gain 5-10 lbs in 48 hours with associated lower extremity swelling, please contact our office at [phone]  4. Fever of 101.5 for at least 24 hours with no source, please contact our office at [phone]  5. Activity- up as tolerated, please walk at least 3 times per day.  Avoid strenuous activity, no lifting, pushing, or pulling with your arms over 8-10 lbs for a minimum of 6 weeks  6. If any questions or concerns arise, please do not hesitate to contact our office at [phone]   _______________________________________ Information on my medicine - ELIQUIS (apixaban)   Why was Eliquis prescribed for you? Eliquis was prescribed for you to reduce the risk of a blood clot forming that can cause a stroke if you have a medical condition called atrial fibrillation (a type of irregular heartbeat).  What do You need to know about Eliquis ? Take your Eliquis TWICE DAILY - one tablet in the morning and one tablet in the evening with or without food. If you have difficulty swallowing the tablet whole please discuss with your pharmacist how to take the medication safely.  Take Eliquis exactly as prescribed by your doctor and DO NOT stop taking Eliquis without talking to the doctor who prescribed the medication.  Stopping may increase your risk of developing a stroke.  Refill your prescription before you run out.  After discharge, you should  have regular check-up appointments with your healthcare provider that is prescribing your Eliquis.  In the future your dose may need to be changed if your kidney function or weight changes by a significant amount or as you get older.  What do you do if you miss a dose? If you miss a dose, take it as soon as you remember on the same day and resume taking twice daily.  Do not take more than one dose of ELIQUIS at the same time to make up a missed dose.  Important Safety Information A possible side effect of Eliquis is bleeding. You should call your healthcare provider right away if you experience any of the following: Bleeding from an injury or your nose that does not stop. Unusual colored urine (red or dark brown) or unusual colored stools (red or black). Unusual bruising for unknown reasons. A serious fall or if you hit your head (even if there is no bleeding).  Some medicines may interact with Eliquis and might increase your risk of bleeding or clotting while on Eliquis. To help avoid this, consult your healthcare provider or pharmacist prior to using any new prescription or non-prescription medications, including herbals, vitamins, non-steroidal anti-inflammatory drugs (NSAIDs) and supplements.  This website has more information on Eliquis (apixaban): http://www.eliquis.com/eliquis/home

## 2021-08-31 NOTE — Progress Notes (Signed)
      [address]       [place] [address]             [phone]      Awaiting bed on telemetry unit Walking with RN BP 122/64   Pulse 66   Temp 98.9 F (37.2 C) (Oral)   Resp (!) 21   Ht 5\' 11"  (1.803 m)   Wt 99.1 kg   SpO2 98%   BMI 30.47 kg/m   Intake/Output Summary (Last 24 hours) at 08/31/2021 1627 Last data filed at 08/31/2021 1326 Gross per 24 hour  Intake 943 ml  Output 1810 ml  Net -867 ml   No new issues.  Continues to progress well.  [name] [name], MD [facility] [phone]

## 2021-08-31 NOTE — Plan of Care (Signed)
  Problem: Activity: Goal: Risk for activity intolerance will decrease Outcome: Progressing   Problem: Respiratory: Goal: Respiratory status will improve Outcome: Progressing   Problem: Urinary Elimination: Goal: Ability to achieve and maintain adequate renal perfusion and functioning will improve Outcome: Progressing

## 2021-08-31 NOTE — Progress Notes (Addendum)
T[facility] DAILY ICU PROGRESS NOTE                   [address]            [place] [address]          [phone]   2 Days Post-Op Procedure(s) (LRB): CORONARY ARTERY BYPASS GRAFTING (CABG)x 4 ON CARDIOPULMONARY BYPASS. LIMA TO LAD, SVG TO OM1, SVG TO PDA-OM2 SEQ. (N/A) TRANSESOPHAGEAL ECHOCARDIOGRAM (TEE) (N/A) ENDOVEIN HARVEST OF GREATER SAPHENOUS VEIN (Right) APPLICATION OF CELL SAVER  Total Length of Stay:  LOS: 8 days   Subjective: Up in the bedside chair, walked in the hall this morning. Says he is tired but having minimal pain. No new concerns.   Objective: Vital signs in last 24 hours: Temp:  [98.1 F (36.7 C)-99.2 F (37.3 C)] 98.4 F (36.9 C) (11/04 0000) Pulse Rate:  [80-87] 83 (11/04 0600) Cardiac Rhythm: Atrial paced (11/04 0400) Resp:  [11-26] 11 (11/04 0600) BP: (92-125)/(54-66) 104/58 (11/04 0600) SpO2:  [75 %-100 %] 92 % (11/04 0600) Weight:  [99.1 kg] 99.1 kg (11/04 0500)  Filed Weights   08/29/21 0757 08/30/21 0500 08/31/21 0500  Weight: 96.2 kg 97.2 kg 99.1 kg    Weight change: 2.9 kg   Hemodynamic parameters for last 24 hours: PAP: (30)/(14) 30/14  Intake/Output from previous day: 11/03 0701 - 11/04 0700 In: 2183.4 [P.O.:1800; I.V.:183.4; IV Piggyback:200] Out: 1670 [Urine:1650; Chest Tube:20]  Intake/Output this shift: No intake/output data recorded.  Current Meds: Scheduled Meds:  acetaminophen  1,000 mg Oral Q6H   Or   acetaminophen (TYLENOL) oral liquid 160 mg/5 mL  1,000 mg Per Tube Q6H   aspirin EC  325 mg Oral Daily   Or   aspirin  324 mg Per Tube Daily   atorvastatin  80 mg Oral Daily   bisacodyl  10 mg Oral Daily   Or   bisacodyl  10 mg Rectal Daily   Chlorhexidine Gluconate Cloth  6 each Topical Daily   docusate sodium  200 mg Oral Daily   enoxaparin (LOVENOX) injection  40 mg Subcutaneous QHS   influenza vaccine adjuvanted  0.5 mL Intramuscular Tomorrow-1000   insulin aspart  0-24 Units Subcutaneous Q4H    insulin detemir  25 Units Subcutaneous BID   magnesium oxide  400 mg Oral Daily   metoprolol tartrate  12.5 mg Oral BID   Or   metoprolol tartrate  12.5 mg Per Tube BID   omega-3 acid ethyl esters  1 g Oral Daily   pantoprazole  40 mg Oral Daily   potassium chloride  20 mEq Oral Q4H   sodium chloride flush  3 mL Intravenous Q12H   Continuous Infusions:  sodium chloride Stopped (08/30/21 0826)   sodium chloride     sodium chloride      ceFAZolin (ANCEF) IV 2 g (08/31/21 0506)   dexmedetomidine (PRECEDEX) IV infusion Stopped (08/30/21 0058)   insulin Stopped (08/30/21 1258)   lactated ringers     lactated ringers     lactated ringers Stopped (08/30/21 1258)   PRN Meds:.sodium chloride, dextrose, lactated ringers, metoprolol tartrate, midazolam, morphine injection, ondansetron (ZOFRAN) IV, sodium chloride flush, traMADol  General appearance: alert, cooperative, and no distress Neurologic: intact Heart: Has been atrially paced over night. Now in SR in the low 80's. Lungs: breath sounds clear, CXR stable expected changes.  Abdomen: soft and non-tender Extremities: warm and well perfused, RLE EVH site is dry Wound: The sternotomy is  covered with a dry dressing.   Lab Results: CBC: Recent Labs    08/30/21 1703 08/31/21 0348  WBC 21.0* 16.5*  HGB 11.1* 10.0*  HCT 33.7* 30.0*  PLT 163 126*   BMET:  Recent Labs    08/30/21 1703 08/31/21 0348  NA 132* 134*  K 4.1 3.7  CL 103 103  CO2 23 27  GLUCOSE 169* 127*  BUN 18 16  CREATININE 0.87 0.91  CALCIUM 8.5* 8.3*    CMET: Lab Results  Component Value Date   WBC 16.5 (H) 08/31/2021   HGB 10.0 (L) 08/31/2021   HCT 30.0 (L) 08/31/2021   PLT 126 (L) 08/31/2021   GLUCOSE 127 (H) 08/31/2021   CHOL 193 07/10/2021   TRIG 115.0 07/10/2021   HDL 42.70 07/10/2021   LDLCALC 127 (H) 07/10/2021   ALT 16 10/23/2020   AST 15 10/23/2020   NA 134 (L) 08/31/2021   K 3.7 08/31/2021   CL 103 08/31/2021   CREATININE 0.91  08/31/2021   BUN 16 08/31/2021   CO2 27 08/31/2021   TSH 1.46 10/23/2020   PSA 0.02 (L) 10/23/2020   INR 1.3 (H) 08/29/2021   HGBA1C 5.6 08/27/2021      PT/INR:  Recent Labs    08/29/21 1400  LABPROT 16.0*  INR 1.3*   Radiology: No results found.   Assessment/Plan: S/P Procedure(s) (LRB): CORONARY ARTERY BYPASS GRAFTING (CABG)x 4 ON CARDIOPULMONARY BYPASS. LIMA TO LAD, SVG TO OM1, SVG TO PDA-OM2 SEQ. (N/A) TRANSESOPHAGEAL ECHOCARDIOGRAM (TEE) (N/A) ENDOVEIN HARVEST OF GREATER SAPHENOUS VEIN (Right) APPLICATION OF CELL SAVER  -CV- POD2 CABGx 4  after presenting with acute NSTEMI, EF 65%.  Progressing well. BP stable.  HR improved, no longer requiring pacer.   D/C cordis and Foley cath. On ASA, Lipitor and low-dose metoprolol. Plan to start Plavix for NSTEMI after pacer wire removal.   -Pulm- good sats on RA.    -GI- tolerating cardiac diet. No BM yet  -Endo- no H/O DM, Glucose 112-150's. Decrease the Levemir to once daily.   -HEME-mild expected ABL anemia, monitor.  -RENAL- normal function, Wt ~3kg+. Begin diuresis  -DVT PPX- on daily [place] enoxaparin.   -Dispo:-Transfer to progressive care. Advance activity.     Antony Odea, PA-C [phone] 08/31/2021 7:53 AM Patient seen and examined.  Agree with assessment and plan as noted above.  Looks great this morning.  Plan as noted above.  Will transfer to 4 E. when bed available.  [name] [name], MD [facility] [phone]

## 2021-09-01 ENCOUNTER — Inpatient Hospital Stay (HOSPITAL_COMMUNITY): Payer: Medicare Other

## 2021-09-01 LAB — CBC
HCT: 31.3 % — ABNORMAL LOW (ref 39.0–52.0)
Hemoglobin: 10.6 g/dL — ABNORMAL LOW (ref 13.0–17.0)
MCH: 32.5 pg (ref 26.0–34.0)
MCHC: 33.9 g/dL (ref 30.0–36.0)
MCV: 96 fL (ref 80.0–100.0)
Platelets: 134 10*3/uL — ABNORMAL LOW (ref 150–400)
RBC: 3.26 MIL/uL — ABNORMAL LOW (ref 4.22–5.81)
RDW: 13.1 % (ref 11.5–15.5)
WBC: 14 10*3/uL — ABNORMAL HIGH (ref 4.0–10.5)
nRBC: 0 % (ref 0.0–0.2)

## 2021-09-01 LAB — BASIC METABOLIC PANEL
Anion gap: 6 (ref 5–15)
BUN: 17 mg/dL (ref 8–23)
CO2: 23 mmol/L (ref 22–32)
Calcium: 8.2 mg/dL — ABNORMAL LOW (ref 8.9–10.3)
Chloride: 106 mmol/L (ref 98–111)
Creatinine, Ser: 0.85 mg/dL (ref 0.61–1.24)
GFR, Estimated: >60 mL/min (ref >60)
Glucose, Bld: 96 mg/dL (ref 70–99)
Potassium: 4.4 mmol/L (ref 3.5–5.1)
Sodium: 135 mmol/L (ref 135–145)

## 2021-09-01 LAB — GLUCOSE, CAPILLARY
Glucose-Capillary: 90 mg/dL (ref 70–99)
Glucose-Capillary: 114 mg/dL — ABNORMAL HIGH (ref 70–99)
Glucose-Capillary: 128 mg/dL — ABNORMAL HIGH (ref 70–99)
Glucose-Capillary: 135 mg/dL — ABNORMAL HIGH (ref 70–99)

## 2021-09-01 LAB — ECHO INTRAOPERATIVE TEE
Height: 71 in
Weight: 3393.32 oz

## 2021-09-01 MED ORDER — LACTULOSE 10 GM/15ML PO SOLN
20.0000 g | Freq: Every day | ORAL | Status: DC | PRN
  Administered 2021-09-01: 20 g via ORAL
  Filled 2021-09-01: qty 30

## 2021-09-01 MED ORDER — ASPIRIN EC 81 MG PO TBEC
81.0000 mg | DELAYED_RELEASE_TABLET | Freq: Every day | ORAL | Status: DC
  Administered 2021-09-02 – 2021-09-04 (×3): 81 mg via ORAL
  Filled 2021-09-01 (×3): qty 1

## 2021-09-01 MED ORDER — CLOPIDOGREL BISULFATE 75 MG PO TABS
75.0000 mg | ORAL_TABLET | Freq: Every day | ORAL | Status: DC
  Administered 2021-09-02 – 2021-09-04 (×3): 75 mg via ORAL
  Filled 2021-09-01 (×3): qty 1

## 2021-09-01 MED ORDER — METOPROLOL SUCCINATE ER 25 MG PO TB24
25.0000 mg | ORAL_TABLET | Freq: Every day | ORAL | Status: DC
  Administered 2021-09-01 – 2021-09-04 (×4): 25 mg via ORAL
  Filled 2021-09-01 (×4): qty 1

## 2021-09-01 NOTE — Plan of Care (Signed)
  Problem: Cardiovascular: Goal: Ability to achieve and maintain adequate cardiovascular perfusion will improve Outcome: Progressing   Problem: Education: Goal: Understanding of CV disease, CV risk reduction, and recovery process will improve Outcome: Adequate for Discharge   Problem: Activity: Goal: Ability to return to baseline activity level will improve Outcome: Adequate for Discharge   Problem: Cardiovascular: Goal: Vascular access site(s) Level 0-1 will be maintained Outcome: Adequate for Discharge   Problem: Health Behavior/Discharge Planning: Goal: Ability to safely manage health-related needs after discharge will improve Outcome: Adequate for Discharge

## 2021-09-01 NOTE — Progress Notes (Signed)
Pt ambulated 560 feet in hallway, standby assist on room air and tolerated it very well. Pt now in chair. VSS. Lucius Conn, RN

## 2021-09-01 NOTE — Progress Notes (Signed)
      [address]       [place] [address]             [phone]      No complaints  BP 108/70   Pulse 78   Temp 98.1 F (36.7 C) (Oral)   Resp (!) 21   Ht 5\' 11"  (1.803 m)   Wt 98.3 kg   SpO2 96%   BMI 30.23 kg/m   Plan home in AM  [place] C. [name], MD [facility] [phone]

## 2021-09-01 NOTE — TOC Progression Note (Signed)
Transition of Care [facility]) - Progression Note    Patient Details  Name: Kyle Strickland MRN: [id number] Date of Birth: 1946/04/12  [facility] CM/SW Contact  Bartholomew Crews, RN Phone Number: [phone] 09/01/2021, 3:22 PM  Clinical Narrative:     Notified by nursing of DME needs for RW per cardiac rehab recommendation. Referral to [facility] for delivery to room.   Expected Discharge Plan: Home/Self Care Barriers to Discharge: Continued Medical Work up  Expected Discharge Plan and Services Expected Discharge Plan: Home/Self Care                                               Social Determinants of Health (SDOH) Interventions    Readmission Risk Interventions No flowsheet data found.

## 2021-09-01 NOTE — Progress Notes (Signed)
3 Days Post-Op Procedure(s) (LRB): CORONARY ARTERY BYPASS GRAFTING (CABG)x 4 ON CARDIOPULMONARY BYPASS. LIMA TO LAD, SVG TO OM1, SVG TO PDA-OM2 SEQ. (N/A) TRANSESOPHAGEAL ECHOCARDIOGRAM (TEE) (N/A) ENDOVEIN HARVEST OF GREATER SAPHENOUS VEIN (Right) APPLICATION OF CELL SAVER Subjective: C/o "soreness, not pain"  Objective: Vital signs in last 24 hours: Temp:  [98.3 F (36.8 C)-99.3 F (37.4 C)] 98.5 F (36.9 C) (11/05 0800) Pulse Rate:  [66-96] 78 (11/05 0800) Cardiac Rhythm: Normal sinus rhythm (11/05 0800) Resp:  [12-23] 15 (11/05 0800) BP: (104-122)/(52-64) 116/58 (11/05 0800) SpO2:  [83 %-100 %] 99 % (11/05 0800) Weight:  [98.3 kg] 98.3 kg (11/05 0500)  Hemodynamic parameters for last 24 hours:    Intake/Output from previous day: 11/04 0701 - 11/05 0700 In: 130 [P.O.:120; I.V.:10] Out: 2350 [Urine:2350] Intake/Output this shift: Total I/O In: 360 [P.O.:360] Out: -   General appearance: alert, cooperative, and no distress Neurologic: intact Heart: regular rate and rhythm Lungs: diminished breath sounds bibasilar Abdomen: normal findings: soft, non-tender  Lab Results: Recent Labs    08/31/21 0348 09/01/21 0300  WBC 16.5* 14.0*  HGB 10.0* 10.6*  HCT 30.0* 31.3*  PLT 126* 134*   BMET:  Recent Labs    08/31/21 0348 09/01/21 0300  NA 134* 135  K 3.7 4.4  CL 103 106  CO2 27 23  GLUCOSE 127* 96  BUN 16 17  CREATININE 0.91 0.85  CALCIUM 8.3* 8.2*    PT/INR:  Recent Labs    08/29/21 1400  LABPROT 16.0*  INR 1.3*   ABG    Component Value Date/Time   PHART 7.352 08/29/2021 1803   HCO3 22.1 08/29/2021 1803   TCO2 23 08/29/2021 1803   ACIDBASEDEF 3.0 (H) 08/29/2021 1803   O2SAT 98.0 08/29/2021 1803   CBG (last 3)  Recent Labs    08/31/21 1544 08/31/21 2222 09/01/21 0652  GLUCAP 150* 133* 90    Assessment/Plan: S/P Procedure(s) (LRB): CORONARY ARTERY BYPASS GRAFTING (CABG)x 4 ON CARDIOPULMONARY BYPASS. LIMA TO LAD, SVG TO OM1, SVG TO  PDA-OM2 SEQ. 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

## 2021-09-01 NOTE — Progress Notes (Signed)
CARDIAC REHAB PHASE I   D/c education completed with pt. Pt educated on importance of site care and monitoring incisions daily. Encouraged continued IS use, walks, and sternal precautions. Pt given in-the-tube sheet along with heart healthy diet. Reviewed restrictions and exercise guidelines. Pt requesting walker for home use, RN aware. Will refer to [facility] [place]. Hopeful for d/c tomorrow. Did not ambulate pt as pt is on bedrest for EPW removal.  [phone] Rufina Falco, RN BSN 09/01/2021 1:51 PM

## 2021-09-01 NOTE — Progress Notes (Signed)
Removed sternal and chest tube dressings and cleansed with betadine. Removed epicardial pacing wires at bedside with Scheryl Darter, RN per MD order. Pt bedrest starting 1330. Small amount of blood from right epicardial pacer site. Pressure held, bleeding has stopped. Dressing placed. Monitoring closely, VSS.  Lucius Conn, RN

## 2021-09-02 LAB — GLUCOSE, CAPILLARY
Glucose-Capillary: 114 mg/dL — ABNORMAL HIGH (ref 70–99)
Glucose-Capillary: 114 mg/dL — ABNORMAL HIGH (ref 70–99)
Glucose-Capillary: 126 mg/dL — ABNORMAL HIGH (ref 70–99)
Glucose-Capillary: 124 mg/dL — ABNORMAL HIGH (ref 70–99)
Glucose-Capillary: 135 mg/dL — ABNORMAL HIGH (ref 70–99)

## 2021-09-02 MED ORDER — CHLORHEXIDINE GLUCONATE CLOTH 2 % EX PADS: 6.0000 | MEDICATED_PAD | Freq: Every day | CUTANEOUS | Status: DC

## 2021-09-02 MED ORDER — AMIODARONE HCL IN DEXTROSE 360-4.14 MG/200ML-% IV SOLN
30.0000 mg/h | INTRAVENOUS | Status: AC
  Administered 2021-09-02 – 2021-09-03 (×3): 30 mg/h via INTRAVENOUS
  Filled 2021-09-02 (×3): qty 200

## 2021-09-02 MED ORDER — AMIODARONE HCL IN DEXTROSE 360-4.14 MG/200ML-% IV SOLN
60.0000 mg/h | INTRAVENOUS | Status: AC
  Administered 2021-09-02 (×2): 60 mg/h via INTRAVENOUS
  Filled 2021-09-02 (×2): qty 200

## 2021-09-02 MED ORDER — AMIODARONE LOAD VIA INFUSION
150.0000 mg | Freq: Once | INTRAVENOUS | Status: AC
  Administered 2021-09-02: 150 mg via INTRAVENOUS
  Filled 2021-09-02: qty 83.34

## 2021-09-02 MED ORDER — HYDROCORTISONE 1 % EX CREA
TOPICAL_CREAM | Freq: Two times a day (BID) | CUTANEOUS | Status: DC
  Administered 2021-09-02: 1 via TOPICAL
  Filled 2021-09-02: qty 28

## 2021-09-02 NOTE — Progress Notes (Signed)
Pt went into Afib RVR with HR up to 170's; Pt presented with no acute distress.   2.5mg  Metoprolol tartrate given via IV.  Pt still in Afib, rate is down to 100-110's now with Intermittent fluctuations to 160's.

## 2021-09-02 NOTE — Progress Notes (Signed)
4 Days Post-Op Procedure(s) (LRB): CORONARY ARTERY BYPASS GRAFTING (CABG)x 4 ON CARDIOPULMONARY BYPASS. LIMA TO LAD, SVG TO OM1, SVG TO PDA-OM2 SEQ. (N/A) TRANSESOPHAGEAL ECHOCARDIOGRAM (TEE) (N/A) ENDOVEIN HARVEST OF GREATER SAPHENOUS VEIN (Right) APPLICATION OF CELL SAVER Subjective: C/o rash on back, better this AM  Objective: Vital signs in last 24 hours: Temp:  [98.1 F (36.7 C)-99.4 F (37.4 C)] 98.3 F (36.8 C) (11/06 0650) Pulse Rate:  [48-100] 77 (11/06 0700) Cardiac Rhythm: Normal sinus rhythm (11/06 0800) Resp:  [10-26] 23 (11/06 0800) BP: (64-133)/(22-75) 101/71 (11/06 0700) SpO2:  [69 %-100 %] 99 % (11/06 0700) Weight:  [98.3 kg] 98.3 kg (11/06 0500)  Hemodynamic parameters for last 24 hours:    Intake/Output from previous day: 11/05 0701 - 11/06 0700 In: 600 [P.O.:600] Out: 1100 [Urine:1100] Intake/Output this shift: Total I/O In: 240 [P.O.:240] Out: 275 [Urine:275]  General appearance: alert, cooperative, and no distress Neurologic: intact Heart: regular rate and rhythm Lungs: clear to auscultation bilaterally Wound: clean and dry Diffuse mild erythematous rash on back  Lab Results: Recent Labs    08/31/21 0348 09/01/21 0300  WBC 16.5* 14.0*  HGB 10.0* 10.6*  HCT 30.0* 31.3*  PLT 126* 134*   BMET:  Recent Labs    08/31/21 0348 09/01/21 0300  NA 134* 135  K 3.7 4.4  CL 103 106  CO2 27 23  GLUCOSE 127* 96  BUN 16 17  CREATININE 0.91 0.85  CALCIUM 8.3* 8.2*    PT/INR: No results for input(s): LABPROT, INR in the last 72 hours. ABG    Component Value Date/Time   PHART 7.352 08/29/2021 1803   HCO3 22.1 08/29/2021 1803   TCO2 23 08/29/2021 1803   ACIDBASEDEF 3.0 (H) 08/29/2021 1803   O2SAT 98.0 08/29/2021 1803   CBG (last 3)  Recent Labs    09/01/21 1551 09/01/21 2139 09/02/21 0650  GLUCAP 128* 135* 114*    Assessment/Plan: S/P Procedure(s) (LRB): CORONARY ARTERY BYPASS GRAFTING (CABG)x 4 ON CARDIOPULMONARY BYPASS. LIMA TO  LAD, SVG TO OM1, SVG TO PDA-OM2 SEQ. (N/A) TRANSESOPHAGEAL ECHOCARDIOGRAM (TEE) (N/A) ENDOVEIN HARVEST OF GREATER SAPHENOUS VEIN (Right) APPLICATION OF CELL SAVER POD # 4 Overall looks good Rash- unclear etiology, itching improved with antifungal powder but I doubt this is a fungal rash. Per report it is better this AM. Will start hydrocortisone cream BID Home later today   LOS: 10 days    Melrose Nakayama 09/02/2021

## 2021-09-02 NOTE — Progress Notes (Signed)
      [address]       [place] [address]             [phone]      Update  Kyle Strickland went into A fib with RVR this morning. Treated with IV metoprolol and amiodarone.  Now back in SR. Sitting up eating lunch.  Plan for dc cancelled. To 4E when bed available.  [name] [name], MD [facility] [phone]

## 2021-09-03 DIAGNOSIS — I214 Non-ST elevation (NSTEMI) myocardial infarction: Secondary | ICD-10-CM | POA: Diagnosis not present

## 2021-09-03 LAB — GLUCOSE, CAPILLARY
Glucose-Capillary: 114 mg/dL — ABNORMAL HIGH (ref 70–99)
Glucose-Capillary: 108 mg/dL — ABNORMAL HIGH (ref 70–99)
Glucose-Capillary: 151 mg/dL — ABNORMAL HIGH (ref 70–99)
Glucose-Capillary: 127 mg/dL — ABNORMAL HIGH (ref 70–99)

## 2021-09-03 LAB — MAGNESIUM: Magnesium: 2.2 mg/dL (ref 1.7–2.4)

## 2021-09-03 MED ORDER — AMIODARONE HCL 200 MG PO TABS
400.0000 mg | ORAL_TABLET | Freq: Two times a day (BID) | ORAL | Status: DC
Start: 2021-09-03 — End: 2021-09-03

## 2021-09-03 MED ORDER — AMIODARONE HCL 200 MG PO TABS
400.0000 mg | ORAL_TABLET | Freq: Two times a day (BID) | ORAL | Status: DC
  Administered 2021-09-03 – 2021-09-04 (×3): 400 mg via ORAL
  Filled 2021-09-03 (×3): qty 2

## 2021-09-03 MED ORDER — AMIODARONE LOAD VIA INFUSION
150.0000 mg | Freq: Once | INTRAVENOUS | Status: AC
  Administered 2021-09-03: 150 mg via INTRAVENOUS
  Filled 2021-09-03: qty 83.34

## 2021-09-03 MED ORDER — SENNOSIDES-DOCUSATE SODIUM 8.6-50 MG PO TABS
1.0000 | ORAL_TABLET | Freq: Two times a day (BID) | ORAL | Status: DC | PRN
  Administered 2021-09-03: 1 via ORAL
  Filled 2021-09-03: qty 1

## 2021-09-03 MED FILL — Magnesium Sulfate Inj 50%: INTRAMUSCULAR | Qty: 10 | Status: AC

## 2021-09-03 MED FILL — Heparin Sodium (Porcine) Inj 1000 Unit/ML: Qty: 1000 | Status: AC

## 2021-09-03 MED FILL — Potassium Chloride Inj 2 mEq/ML: INTRAVENOUS | Qty: 40 | Status: AC

## 2021-09-03 NOTE — Progress Notes (Signed)
Progress Note  Patient Name: Kyle Strickland Date of Encounter: 09/03/2021  [facility] Cardiologist: Larae Grooms, MD   Subjective   Feeling well today.  Denies chest pain or shortness of breath.  Inpatient Medications    Scheduled Meds:  acetaminophen  1,000 mg Oral Q6H   Or   acetaminophen (TYLENOL) oral liquid 160 mg/5 mL  1,000 mg Per Tube Q6H   amiodarone  400 mg Oral BID   aspirin EC  81 mg Oral Daily   atorvastatin  80 mg Oral Daily   clopidogrel  75 mg Oral Daily   enoxaparin (LOVENOX) injection  40 mg Subcutaneous QHS   furosemide  40 mg Oral Daily   hydrocortisone cream   Topical BID   influenza vaccine adjuvanted  0.5 mL Intramuscular Tomorrow-1000   insulin aspart  0-15 Units Subcutaneous TID WC   insulin aspart  0-5 Units Subcutaneous QHS   magnesium oxide  400 mg Oral Daily   metoprolol succinate  25 mg Oral Daily   omega-3 acid ethyl esters  1 g Oral Daily   pantoprazole  40 mg Oral Daily   potassium chloride SA  20 mEq Oral Daily   sodium chloride flush  3 mL Intravenous Q12H   Continuous Infusions:  sodium chloride     amiodarone 30 mg/hr (09/03/21 0944)   PRN Meds: sodium chloride, alum & mag hydroxide-simeth, magnesium hydroxide, metoprolol tartrate, ondansetron (ZOFRAN) IV, sodium chloride flush, traMADol, zolpidem   Vital Signs    Vitals:   09/03/21 0500 09/03/21 0808 09/03/21 1000 09/03/21 1159  BP:  116/65 117/85 113/76  Pulse:  68 70 64  Resp:  18 18 19   Temp:  98.1 F (36.7 C)  98 F (36.7 C)  TempSrc:  Oral  Oral  SpO2:  99% 99% 94%  Weight: 99.6 kg     Height:        Intake/Output Summary (Last 24 hours) at 09/03/2021 1407 Last data filed at 09/02/2021 1500 Gross per 24 hour  Intake 33.28 ml  Output 240 ml  Net -206.72 ml   Last 3 Weights 09/03/2021 09/02/2021 09/01/2021  Weight (lbs) 219 lb 8 oz 216 lb 11.4 oz 216 lb 11.4 oz  Weight (kg) 99.565 kg 98.3 kg 98.3 kg      Telemetry    Today's telemetry review shows  normal sinus rhythm with a period of atrial bigeminy - Personally Reviewed   Physical Exam  Alert, oriented, no distress GEN: No acute distress.   Neck: No JVD Cardiac: RRR, no murmurs, rubs, or gallops.  Respiratory: Clear to auscultation bilaterally. GI: Soft, nontender, non-distended  MS: No edema; No deformity. Neuro:  Nonfocal  Psych: Normal affect   Labs    High Sensitivity Troponin:   Recent Labs  Lab 08/23/21 0949 08/23/21 1200  TROPONINIHS 381* 403*     Chemistry Recent Labs  Lab 08/30/21 0411 08/30/21 1703 08/31/21 0348 09/01/21 0300 09/03/21 0328  NA 134* 132* 134* 135  --   K 4.3 4.1 3.7 4.4  --   CL 108 103 103 106  --   CO2 23 23 27 23   --   GLUCOSE 126* 169* 127* 96  --   BUN 15 18 16 17   --   CREATININE 0.81 0.87 0.91 0.85  --   CALCIUM 8.2* 8.5* 8.3* 8.2*  --   MG 2.5* 2.3  --   --  2.2  GFRNONAA >60 >60 >60 >60  --   ANIONGAP  3* 6 4* 6  --     Lipids No results for input(s): CHOL, TRIG, HDL, LABVLDL, LDLCALC, CHOLHDL in the last 168 hours.  Hematology Recent Labs  Lab 08/30/21 1703 08/31/21 0348 09/01/21 0300  WBC 21.0* 16.5* 14.0*  RBC 3.48* 3.12* 3.26*  HGB 11.1* 10.0* 10.6*  HCT 33.7* 30.0* 31.3*  MCV 96.8 96.2 96.0  MCH 31.9 32.1 32.5  MCHC 32.9 33.3 33.9  RDW 13.1 13.2 13.1  PLT 163 126* 134*   Thyroid No results for input(s): TSH, FREET4 in the last 168 hours.  BNPNo results for input(s): BNP, PROBNP in the last 168 hours.  DDimer No results for input(s): DDIMER in the last 168 hours.   Radiology    No results found.   Patient Profile     75 y.o. 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  Non-STEMI: Now postoperative day #5 from multivessel CABG 2.  Postoperative atrial fibrillation: Back in sinus rhythm on IV amiodarone.  Transitioning to oral amiodarone.  Maintaining sinus rhythm today.  Telemetry is personally  reviewed. 3.  Postoperative blood loss anemia: Stable, mild.  Last hemoglobin 10.6.  Overall the patient appears to be progressing well.  He is on aspirin and clopidogrel, high intensity statin drug with atorvastatin, and a beta-blocker with metoprolol succinate 25 mg daily.  He is now on amiodarone, transitioning from IV to oral today.  He expects to be discharged tomorrow as long as he is maintaining sinus rhythm.  Will arrange outpatient cardiology follow-up.  I discussed postoperative atrial fibrillation with him and he understands that he will likely be on oral amiodarone for approximately 8 weeks if no recurrent atrial fibrillation .     For questions or updates, please contact [place] Please consult [email or web address] for contact info under        Signed, Sherren Mocha, MD  09/03/2021, 2:07 PM

## 2021-09-03 NOTE — Progress Notes (Signed)
Mobility Specialist: Progress Note   09/03/21 1508  Mobility  Activity Ambulated in hall  Level of Assistance Independent  Assistive Device None  Distance Ambulated (ft) 470 ft  Mobility Ambulated independently in hallway  Mobility Response Tolerated well  Mobility performed by Mobility specialist  $Mobility charge 1 Mobility   Pre-Mobility: 75 HR, 96% SpO2 Post-Mobility: 81 HR, 100% SpO2  Pt had no c/o during ambulation. Pt back to bed after walk with call bell at his side.   [facility] Daylani Deblois Mobility Specialist Mobility Specialist Phone: [phone]

## 2021-09-03 NOTE — Progress Notes (Addendum)
[address]       [place] [address]             [phone]      5 Days Post-Op Procedure(s) (LRB): CORONARY ARTERY BYPASS GRAFTING (CABG)x 4 ON CARDIOPULMONARY BYPASS. LIMA TO LAD, SVG TO OM1, SVG TO PDA-OM2 SEQ. (N/A) TRANSESOPHAGEAL ECHOCARDIOGRAM (TEE) (N/A) ENDOVEIN HARVEST OF GREATER SAPHENOUS VEIN (Right) APPLICATION OF CELL SAVER Subjective: Feels well, some incisional soreness  Objective: Vital signs in last 24 hours: Temp:  [98 F (36.7 C)-98.4 F (36.9 C)] 98 F (36.7 C) (11/07 0410) Pulse Rate:  [66-111] 69 (11/07 0410) Cardiac Rhythm: Normal sinus rhythm (11/06 1900) Resp:  [15-23] 17 (11/07 0410) BP: (94-122)/(56-76) 112/57 (11/07 0410) SpO2:  [94 %-100 %] 98 % (11/07 0410) Weight:  [99.6 kg] 99.6 kg (11/07 0500)  Hemodynamic parameters for last 24 hours:    Intake/Output from previous day: 11/06 0701 - 11/07 0700 In: 379.2 [P.O.:240; I.V.:139.2] Out: 815 [Urine:815] Intake/Output this shift: No intake/output data recorded.  General appearance: alert, cooperative, and no distress Heart: regular rate and rhythm Lungs: clear to auscultation bilaterally Abdomen: benign Extremities: no edema Wound: incis healing well  Lab Results: Recent Labs    09/01/21 0300  WBC 14.0*  HGB 10.6*  HCT 31.3*  PLT 134*   BMET:  Recent Labs    09/01/21 0300  NA 135  K 4.4  CL 106  CO2 23  GLUCOSE 96  BUN 17  CREATININE 0.85  CALCIUM 8.2*    PT/INR: No results for input(s): LABPROT, INR in the last 72 hours. ABG    Component Value Date/Time   PHART 7.352 08/29/2021 1803   HCO3 22.1 08/29/2021 1803   TCO2 23 08/29/2021 1803   ACIDBASEDEF 3.0 (H) 08/29/2021 1803   O2SAT 98.0 08/29/2021 1803   CBG (last 3)  Recent Labs    09/02/21 1759 09/02/21 2100 09/03/21 0611  GLUCAP 124* 135* 114*    Meds Scheduled Meds:  acetaminophen  1,000 mg Oral Q6H   Or   acetaminophen (TYLENOL) oral liquid 160 mg/5 mL  1,000 mg Per Tube  Q6H   aspirin EC  81 mg Oral Daily   atorvastatin  80 mg Oral Daily   clopidogrel  75 mg Oral Daily   enoxaparin (LOVENOX) injection  40 mg Subcutaneous QHS   furosemide  40 mg Oral Daily   hydrocortisone cream   Topical BID   influenza vaccine adjuvanted  0.5 mL Intramuscular Tomorrow-1000   insulin aspart  0-15 Units Subcutaneous TID WC   insulin aspart  0-5 Units Subcutaneous QHS   magnesium oxide  400 mg Oral Daily   metoprolol succinate  25 mg Oral Daily   omega-3 acid ethyl esters  1 g Oral Daily   pantoprazole  40 mg Oral Daily   potassium chloride SA  20 mEq Oral Daily   sodium chloride flush  3 mL Intravenous Q12H   Continuous Infusions:  sodium chloride     amiodarone 30 mg/hr (09/02/21 2101)   PRN Meds:.sodium chloride, alum & mag hydroxide-simeth, magnesium hydroxide, metoprolol tartrate, ondansetron (ZOFRAN) IV, sodium chloride flush, traMADol, zolpidem  Xrays No results found.  Assessment/Plan: S/P Procedure(s) (LRB): CORONARY ARTERY BYPASS GRAFTING (CABG)x 4 ON CARDIOPULMONARY BYPASS. LIMA TO LAD, SVG TO OM1, SVG TO PDA-OM2 SEQ. (N/A) TRANSESOPHAGEAL ECHOCARDIOGRAM (TEE) (N/A) ENDOVEIN HARVEST OF GREATER SAPHENOUS VEIN (Right) APPLICATION OF CELL SAVER  1 afeb, VSS s BP 94-122, sinus rhythm- on amio  gtt- transition to po 2 sats good on RA 3 adeq UOP, weight up approc 3 kg over preop- cont gentle diuresis 4 MG 2.2 5 BS well  controlled- not a diabetic 6 cont routine pulm toilet and rehab modalities     LOS: 11 days    John Giovanni PA-C Pager [phone] 09/03/2021  Patient seen and examined, agree with above In SR, transition to PO amiodarone Possibly home tomorrow  [place]. [name], MD [facility] [phone]

## 2021-09-03 NOTE — Progress Notes (Signed)
CARDIAC REHAB PHASE I   PRE:  Rate/Rhythm: 65 SR    BP: sitting 124/70    SaO2: 100 RA  MODE:  Ambulation: 880 ft   POST:  Rate/Rhythm: 90 SR    BP: sitting 117/85     SaO2: 98 RA  Pt stood and ambulated independently. No c/o, no afib. Didn't need RW. Return to recliner, practiced IS, 1500 ml. 0930-1010   [place], ACSM 09/03/2021 10:06 AM

## 2021-09-04 ENCOUNTER — Other Ambulatory Visit (HOSPITAL_COMMUNITY): Payer: Self-pay

## 2021-09-04 DIAGNOSIS — I4891 Unspecified atrial fibrillation: Secondary | ICD-10-CM | POA: Diagnosis not present

## 2021-09-04 DIAGNOSIS — I9789 Other postprocedural complications and disorders of the circulatory system, not elsewhere classified: Secondary | ICD-10-CM | POA: Diagnosis not present

## 2021-09-04 LAB — GLUCOSE, CAPILLARY: Glucose-Capillary: 100 mg/dL — ABNORMAL HIGH (ref 70–99)

## 2021-09-04 LAB — MAGNESIUM: Magnesium: 2.4 mg/dL (ref 1.7–2.4)

## 2021-09-04 MED ORDER — TRAMADOL HCL 50 MG PO TABS: 100.0000 mg | ORAL_TABLET | ORAL | 0 refills | Status: DC | PRN

## 2021-09-04 MED ORDER — APIXABAN 5 MG PO TABS: 5.0000 mg | ORAL_TABLET | Freq: Two times a day (BID) | ORAL | 1 refills | Status: DC

## 2021-09-04 MED ORDER — FUROSEMIDE 40 MG PO TABS: 40.0000 mg | ORAL_TABLET | Freq: Every day | ORAL | 0 refills | Status: DC

## 2021-09-04 MED ORDER — METOPROLOL SUCCINATE ER 25 MG PO TB24: 25.0000 mg | ORAL_TABLET | Freq: Every day | ORAL | 1 refills | Status: DC

## 2021-09-04 MED ORDER — OMEGA-3 FATTY ACIDS 1000 MG PO CAPS: 1.0000 g | ORAL_CAPSULE | Freq: Every day | ORAL | Status: DC

## 2021-09-04 MED ORDER — ATORVASTATIN CALCIUM 80 MG PO TABS: 80.0000 mg | ORAL_TABLET | Freq: Every day | ORAL | 1 refills | Status: DC

## 2021-09-04 MED ORDER — APIXABAN 5 MG PO TABS
5.0000 mg | ORAL_TABLET | Freq: Two times a day (BID) | ORAL | 1 refills | Status: DC
  Filled 2021-09-04: qty 60, 30d supply, fill #0

## 2021-09-04 MED ORDER — POTASSIUM CHLORIDE CRYS ER 20 MEQ PO TBCR: 20.0000 meq | EXTENDED_RELEASE_TABLET | Freq: Every day | ORAL | 0 refills | Status: DC

## 2021-09-04 MED ORDER — APIXABAN 5 MG PO TABS
5.0000 mg | ORAL_TABLET | Freq: Two times a day (BID) | ORAL | Status: DC
  Administered 2021-09-04: 5 mg via ORAL
  Filled 2021-09-04: qty 1

## 2021-09-04 MED ORDER — AMIODARONE HCL 400 MG PO TABS: 400.0000 mg | ORAL_TABLET | Freq: Two times a day (BID) | ORAL | 1 refills | Status: DC

## 2021-09-04 MED ORDER — AMIODARONE HCL 200 MG PO TABS: 400.0000 mg | ORAL_TABLET | Freq: Two times a day (BID) | ORAL | 0 refills | Status: DC

## 2021-09-04 MED ORDER — HYDROCORTISONE 1 % EX CREA: TOPICAL_CREAM | Freq: Two times a day (BID) | CUTANEOUS | 0 refills | Status: DC

## 2021-09-04 NOTE — Care Management Important Message (Signed)
Important Message  Patient Details  Name: [name] MRN: [id number] Date of Birth: 05-14-1946   Medicare Important Message Given:  Yes     Shelda Altes 09/04/2021, 1:26 PM

## 2021-09-04 NOTE — TOC Benefit Eligibility Note (Signed)
Patient Advocate Encounter ° °Insurance verification completed.   ° °The patient is currently admitted and upon discharge could be taking Eliquis 5 mg. ° °The current 30 day co-pay is, $47.00.  ° °The patient is insured through [employer] Medicare Part D  ° ° ° °Mahamud Metts, CPhT °Pharmacy Patient Advocate Specialist ° Pharmacy Patient Advocate Team °Direct Number: [phone]  Fax: [phone] ° ° ° ° ° °  °

## 2021-09-04 NOTE — TOC Transition Note (Signed)
Transition of Care (TOC) - CM/SW Discharge Note Marvetta Gibbons RN, BSN Transitions of Care Unit 4E- RN Case Manager See Treatment Team for direct phone #    Patient Details  Name: ARREN LAMINACK MRN: [id number] Date of Birth: Jun 17, 1946  [facility] CM/SW Contact:  Dawayne Patricia, RN Phone Number: 09/04/2021, 11:19 AM   Clinical Narrative:    Pt stable for transition home today, DME order placed for Rolling walker- [employer] called for DME need- RW to be delivered to room prior to discharge.    Final next level of care: Home/Self Care Barriers to Discharge: Barriers Resolved   Patient Goals and CMS Choice    In house provider    Discharge Placement             home          Discharge Plan and Services   Discharge Planning Services: CM Consult Post Acute Care Choice: Durable Medical Equipment          DME Arranged: Walker rolling DME Agency: [employer] Date DME Agency Contacted: 09/04/21 Time DME Agency Contacted: 1000 Representative spoke with at DME Agency: Freda Munro HH Arranged: NA [place] Agency: NA        Social Determinants of Health ([place]) Interventions     Readmission Risk Interventions Readmission Risk Prevention Plan 09/04/2021  Post Dischage Appt Complete  Medication Screening Complete  Transportation Screening Complete  Some recent data might be hidden

## 2021-09-04 NOTE — Progress Notes (Signed)
Mobility Specialist: Progress Note   09/04/21 1231  Mobility  Activity Ambulated in hall  Level of Assistance Independent  Assistive Device None  Distance Ambulated (ft) 470 ft  Mobility Ambulated independently in hallway  Mobility Response Tolerated well  Mobility performed by Mobility specialist  $Mobility charge 1 Mobility   Pre-Mobility: 77 HR Post-Mobility: 88 HR  Pt to BR and then agreeable to ambulation. Pt had no c/o throughout. Pt back to bed after walk with call bell and phone at his side.   [facility] Katena Petitjean Mobility Specialist Mobility Specialist Phone: [phone]

## 2021-09-04 NOTE — Plan of Care (Signed)
  Problem: Education: Goal: Understanding of CV disease, CV risk reduction, and recovery process will improve Outcome: Adequate for Discharge

## 2021-09-04 NOTE — Progress Notes (Signed)
Pt just ambulated. Gave Off the Beat book for his afib. Will change CRPII to [place] instead of [place] at pts request. [phone] Yves Dill CES, ACSM 10:27 AM 09/04/2021

## 2021-09-04 NOTE — Progress Notes (Signed)
RN called.  Pt reported that the amiodarone prescription for 400mg  tablets would be $290 but that if the prescription was written for 200mg  tablets (take 2 for 400mg  dose) the insurance would cover for $0.  I changed the prescription and called [facility] to confirm receipt of new prescription and $0 copay.  [employer], Pharm.D., BCPS Clinical Pharmacist 09/04/2021 4:28 PM

## 2021-09-04 NOTE — Progress Notes (Signed)
Discharge instructions (including medications) discussed with and copy provided to patient/caregiver 

## 2021-09-04 NOTE — Progress Notes (Signed)
[place] for Apixaban Indication: atrial fibrillation  Allergies  Allergen Reactions   Ciprofloxacin     Hives post op   Ditropan [Oxybutynin Chloride]     Hives 2011, post op   Codeine     Nausea and headaches     Patient Measurements: Height: 5\' 11"  (180.3 cm) Weight: 98 kg (216 lb 0.8 oz) IBW/kg (Calculated) : 75.3  Vital Signs: Temp: 98.4 F (36.9 C) (11/08 0717) Temp Source: Oral (11/08 0717) BP: 121/63 (11/08 0754) Pulse Rate: 69 (11/08 0717)  Labs: No results for input(s): HGB, HCT, PLT, APTT, LABPROT, INR, HEPARINUNFRC, HEPRLOWMOCWT, CREATININE, CKTOTAL, CKMB, TROPONINIHS in the last 72 hours.  Estimated Creatinine Clearance: 89.6 mL/min (by C-G formula based on SCr of 0.85 mg/dL).   Medical History: Past Medical History:  Diagnosis Date   Elevated blood-pressure reading without diagnosis of hypertension    Other and unspecified hyperlipidemia    Prostate cancer ([place]) 2011   Vitamin D deficiency     Medications:  Scheduled:   amiodarone  400 mg Oral BID   aspirin EC  81 mg Oral Daily   atorvastatin  80 mg Oral Daily   furosemide  40 mg Oral Daily   hydrocortisone cream   Topical BID   influenza vaccine adjuvanted  0.5 mL Intramuscular Tomorrow-1000   magnesium oxide  400 mg Oral Daily   metoprolol succinate  25 mg Oral Daily   omega-3 acid ethyl esters  1 g Oral Daily   pantoprazole  40 mg Oral Daily   potassium chloride SA  20 mEq Oral Daily   sodium chloride flush  3 mL Intravenous Q12H   Infusions:   sodium chloride      Assessment: 75 yo M s/p CABG 11/2.  Pt with post-op afib to start anticoagulation with apixaban.    Goal of Therapy:  Therapeutic Anticoagulation Monitor platelets by anticoagulation protocol: Yes   Plan:  Start apixaban 5mg  PO BID. Patient does not meet any of the criteria for dose reduction. Copay check completed $47 Follow-up for s/sx bleeding.  [employer], Pharm.D.,  BCPS Clinical Pharmacist Clinical phone for 09/04/2021 from 7:30-3:00 is [phone].  **Pharmacist phone directory can be found on [email or web address] listed under [place].  09/04/2021 10:27 AM

## 2021-09-04 NOTE — Progress Notes (Addendum)
      [address]       [place] [address]             [phone]        6 Days Post-Op Procedure(s) (LRB): CORONARY ARTERY BYPASS GRAFTING (CABG)x 4 ON CARDIOPULMONARY BYPASS. LIMA TO LAD, SVG TO OM1, SVG TO PDA-OM2 SEQ. (N/A) TRANSESOPHAGEAL ECHOCARDIOGRAM (TEE) (N/A) ENDOVEIN HARVEST OF GREATER SAPHENOUS VEIN (Right) APPLICATION OF CELL SAVER  Subjective: Patient had a bowel movement and slept well. No complaint this am.  Objective: Vital signs in last 24 hours: Temp:  [97.7 F (36.5 C)-98.7 F (37.1 C)] 98.4 F (36.9 C) (11/08 0717) Pulse Rate:  [64-91] 69 (11/08 0717) Cardiac Rhythm: Normal sinus rhythm (11/07 2016) Resp:  [14-19] 15 (11/08 0717) BP: (105-135)/(56-85) 114/66 (11/08 0717) SpO2:  [94 %-100 %] 97 % (11/08 0717) Weight:  [98 kg] 98 kg (11/08 0323)  Pre op weight  96.2 kg Current Weight  09/04/21 98 kg    Intake/Output from previous day: 11/07 0701 - 11/08 0700 In: 240 [P.O.:240] Out: 300 [Urine:300]   Physical Exam:  Cardiovascular: RRR Pulmonary: Clear to auscultation bilaterally Abdomen: Soft, non tender, bowel sounds present. Extremities: Mild bilateral lower extremity edema. Wounds: Clean and dry.  No erythema or signs of infection.  Lab Results: CBC:No results for input(s): WBC, HGB, HCT, PLT in the last 72 hours. BMET: No results for input(s): NA, K, CL, CO2, GLUCOSE, BUN, CREATININE, CALCIUM in the last 72 hours.  PT/INR:  Lab Results  Component Value Date   INR 1.3 (H) 08/29/2021   INR 1.0 08/27/2021   ABG:  INR: Will add last result for INR, ABG once components are confirmed Will add last 4 CBG results once components are confirmed  Assessment/Plan:  1. CV - S/p NSTEMI. SR with HR in the 60's this am. He did have a fib yesterday afternoon. On Amiodarone 400 mg bid, Toprol XL 25 mg daily. Will discuss with Dr. [name] if need to anticoagulate as having episodes of a fib. 2.  Pulmonary - On room air.  Encourage incentive spirometer. 3. Volume Overload - On Lasix 40 mg daily 4.  Expected post op acute blood loss anemia - Last H and H stable at 10.6 and 31.3 5. CBGs 151/127/100. Pre op HGA1C 5.6. Stop accu checks and SS PRN 6. Hope to discharge soon, once determine anticoagulation  Donielle M ZimmermanPA-C 09/04/2021,7:50 AM   Appreciate Dr. Antionette Char input Will dc on Eliquis + 81 mg ASA  Remo Lipps C. [name], MD [facility] [phone]

## 2021-09-04 NOTE — Progress Notes (Signed)
Progress Note  Patient Name: Kyle Strickland Date of Encounter: 09/04/2021  [facility] Cardiologist: Larae Grooms, MD   Subjective   No chest pain or shortness of breath. Feeling much today after a BM.   Inpatient Medications    Scheduled Meds:  amiodarone  400 mg Oral BID   aspirin EC  81 mg Oral Daily   atorvastatin  80 mg Oral Daily   clopidogrel  75 mg Oral Daily   enoxaparin (LOVENOX) injection  40 mg Subcutaneous QHS   furosemide  40 mg Oral Daily   hydrocortisone cream   Topical BID   influenza vaccine adjuvanted  0.5 mL Intramuscular Tomorrow-1000   magnesium oxide  400 mg Oral Daily   metoprolol succinate  25 mg Oral Daily   omega-3 acid ethyl esters  1 g Oral Daily   pantoprazole  40 mg Oral Daily   potassium chloride SA  20 mEq Oral Daily   sodium chloride flush  3 mL Intravenous Q12H   Continuous Infusions:  sodium chloride     PRN Meds: sodium chloride, alum & mag hydroxide-simeth, magnesium hydroxide, metoprolol tartrate, ondansetron (ZOFRAN) IV, senna-docusate, sodium chloride flush, traMADol, zolpidem   Vital Signs    Vitals:   09/03/21 2332 09/04/21 0323 09/04/21 0717 09/04/21 0754  BP: 130/83 105/70 114/66 121/63  Pulse: 71 71 69   Resp: 18 14 15    Temp: 97.7 F (36.5 C) 98.2 F (36.8 C) 98.4 F (36.9 C)   TempSrc: Oral Oral Oral   SpO2: 100% 100% 97% 98%  Weight:  98 kg    Height:        Intake/Output Summary (Last 24 hours) at 09/04/2021 0956 Last data filed at 09/03/2021 1617 Gross per 24 hour  Intake 240 ml  Output 300 ml  Net -60 ml   Last 3 Weights 09/04/2021 09/03/2021 09/02/2021  Weight (lbs) 216 lb 0.8 oz 219 lb 8 oz 216 lb 11.4 oz  Weight (kg) 98 kg 99.565 kg 98.3 kg      Telemetry    Sinus rhythm. Episode of AF yesterday duration approximately 35 minutes - Personally Reviewed    Physical Exam  Alert, oriented, NAD GEN: No acute distress.   Neck: No JVD Cardiac: RRR, no murmurs, rubs, or gallops.   Respiratory: Clear to auscultation bilaterally. GI: Soft, nontender, non-distended  MS: No edema; No deformity. Neuro:  Nonfocal  Psych: Normal affect   Labs    High Sensitivity Troponin:   Recent Labs  Lab 08/23/21 0949 08/23/21 1200  TROPONINIHS 381* 403*     Chemistry Recent Labs  Lab 08/30/21 1703 08/31/21 0348 09/01/21 0300 09/03/21 0328 09/04/21 0324  NA 132* 134* 135  --   --   K 4.1 3.7 4.4  --   --   CL 103 103 106  --   --   CO2 23 27 23   --   --   GLUCOSE 169* 127* 96  --   --   BUN 18 16 17   --   --   CREATININE 0.87 0.91 0.85  --   --   CALCIUM 8.5* 8.3* 8.2*  --   --   MG 2.3  --   --  2.2 2.4  GFRNONAA >60 >60 >60  --   --   ANIONGAP 6 4* 6  --   --     Lipids No results for input(s): CHOL, TRIG, HDL, LABVLDL, LDLCALC, CHOLHDL in the last 168 hours.  Hematology  Recent Labs  Lab 08/30/21 1703 08/31/21 0348 09/01/21 0300  WBC 21.0* 16.5* 14.0*  RBC 3.48* 3.12* 3.26*  HGB 11.1* 10.0* 10.6*  HCT 33.7* 30.0* 31.3*  MCV 96.8 96.2 96.0  MCH 31.9 32.1 32.5  MCHC 32.9 33.3 33.9  RDW 13.1 13.2 13.1  PLT 163 126* 134*   Thyroid No results for input(s): TSH, FREET4 in the last 168 hours.  BNPNo results for input(s): BNP, PROBNP in the last 168 hours.  DDimer No results for input(s): DDIMER in the last 168 hours.   Radiology    No results found.   Patient Profile     75 y.o. 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  Post-op atrial fibrillation: breakthrough episode yesterday on amiodarone, minimally symptomatic. Plan amiodarone 400 mg BID x 2 weeks, then 200 mg daily. Would start apixaban for oral anticoagulation, anticipating use for 2-3 months as long as no recurrent AF. Stop plavix. Will discuss with surgical team to make sure they are ok with these recommendations. OP follow-up arranged  with Ermalinda Barrios in our office 11/30.   [facility] will sign off.   Medication Recommendations:  as above Other recommendations (labs, testing, etc):  none Follow up as an outpatient:  schedule 11/30  For questions or updates, please contact [place] Please consult [email or web address] for contact info under        Signed, Sherren Mocha, MD  09/04/2021, 9:56 AM

## 2021-09-07 ENCOUNTER — Telehealth (HOSPITAL_COMMUNITY): Payer: Self-pay

## 2021-09-07 NOTE — Telephone Encounter (Signed)
Pt insurance is active and benefits verified through [facility] Medicare Co-pay 0, DED 0/0 met, out of pocket $3,600/0 met, co-insurance 0%. no pre-authorization required. Passport, 09/07/2021_0 :27am, REF# [id number]   Will contact patient to see if he is interested in the Cardiac Rehab Program. If interested, patient will need to complete follow up appt. Once completed, patient will be contacted for scheduling upon review by the RN Navigator.

## 2021-09-07 NOTE — Telephone Encounter (Signed)
Called patient to see if he is interested in the Cardiac Rehab Program. Patient expressed interest. Explained scheduling process and went over insurance, patient verbalized understanding. Will contact patient for scheduling once f/u has been completed.  °

## 2021-09-10 ENCOUNTER — Telehealth: Payer: Self-pay

## 2021-09-10 NOTE — Telephone Encounter (Signed)
Transition Care Management Follow-up Telephone Call Date of discharge and from where: 09/04/2021  Kyle Strickland How have you been since you were released from the hospital? "Doing well"  Any questions or concerns? No  Items Reviewed: Did the pt receive and understand the discharge instructions provided? Yes  Medications obtained and verified? Yes  Other? No  Any new allergies since your discharge? No  Dietary orders reviewed? Yes Do you have support at home? Yes   Home Care and Equipment/Supplies: Were home health services ordered? no If so, what is the name of the agency?  Has the agency set up a time to come to the patient's home?  Were any new equipment or medical supplies ordered? yes   What is the name of the medical supply agency? [facility] Were you able to get the supplies/equipment? yes Do you have any questions related to the use of the equipment or supplies? No  Functional Questionnaire: (I = Independent and D = Dependent) ADLs: I  Bathing/Dressing- I  Meal Prep- I  Eating- I  Maintaining continence- I  Transferring/Ambulation- I  Managing Meds- I  Follow up appointments reviewed:  PCP Hospital f/u appt confirmed? No   Specialist Hospital f/u appt confirmed? Yes  Scheduled to see Surgeon on 09/13/2021  Are transportation arrangements needed?  If their condition worsens, is the pt aware to call PCP or go to the Emergency Dept.? yes Was the patient provided with contact information for the PCP's office or ED? Yes Was to pt encouraged to call back with questions or concerns? Yes  Reviewed splinting when coughing. Reviewed constipation. Encouraged patient to use Miralax to help with constipation and avoid straining. Reports wife bought him some.  Tomasa Rand, RN, BSN, CEN [facility] [employer] [phone]

## 2021-09-12 ENCOUNTER — Telehealth (HOSPITAL_COMMUNITY): Payer: Self-pay

## 2021-09-12 ENCOUNTER — Other Ambulatory Visit (HOSPITAL_COMMUNITY): Payer: Self-pay

## 2021-09-12 NOTE — Progress Notes (Signed)
Cardiology Office Note    Date:  09/26/2021   ID:  Hersel, Mcmeen [birth year], MRN [id number]   PCP:  Colon Branch, MD   [place]  Cardiologist:  Larae Grooms, MD   Advanced Practice Provider:  No care team member to display Electrophysiologist:  None   [id number]   Chief Complaint  Patient presents with   Follow-up   Hospitalization Follow-up    History of Present Illness:  Kyle Strickland is a 75 y.o. male with history of hypertension and HLD, was sent to [facility] 08/23/2021 from med center in [facility] for chest pain.  Troponins peaked at 42 considered a non-STEMI.  Cardiac cath 08/23/2021 very complex multivessel CAD. He underwent CABG x4 03/30/00 complicated by post op AFib on Amio.  Patient comes in for f/u. Feels good. A little weak and doesn't have the stamina. Walking 15-20 min daily.  Patient saw Dr. [name] yesterday and was seen in the A. fib clinic as well.    Past Medical History:  Diagnosis Date   Elevated blood-pressure reading without diagnosis of hypertension    Other and unspecified hyperlipidemia    Prostate cancer ([place]) 2011   Vitamin D deficiency     Past Surgical History:  Procedure Laterality Date   CORONARY ARTERY BYPASS GRAFT N/A 08/29/2021   Procedure: CORONARY ARTERY BYPASS GRAFTING (CABG)x 4 ON CARDIOPULMONARY BYPASS. LIMA TO LAD, SVG TO OM1, SVG TO PDA-OM2 SEQ.;  Surgeon: Melrose Nakayama, MD;  Location: [place];  Service: Open Heart Surgery;  Laterality: N/A;   ENDOVEIN HARVEST OF GREATER SAPHENOUS VEIN Right 08/29/2021   Procedure: ENDOVEIN HARVEST OF GREATER SAPHENOUS VEIN;  Surgeon: Melrose Nakayama, MD;  Location: [place];  Service: Open Heart Surgery;  Laterality: Right;   LEFT HEART CATH AND CORONARY ANGIOGRAPHY N/A 08/23/2021   Procedure: LEFT HEART CATH AND CORONARY ANGIOGRAPHY;  Surgeon: Jettie Booze, MD;  Location: [place] [facility];  Service: Cardiovascular;  Laterality: N/A;    PROSTATECTOMY  2011   Dr Darcus Austin, [facility]   TEE WITHOUT CARDIOVERSION N/A 08/29/2021   Procedure: TRANSESOPHAGEAL ECHOCARDIOGRAM (TEE);  Surgeon: Melrose Nakayama, MD;  Location: [place];  Service: Open Heart Surgery;  Laterality: N/A;    Current Medications: Current Meds  Medication Sig   amiodarone (PACERONE) 200 MG tablet Take 2 tablets (400 mg total) by mouth 2 (two) times daily. For 12 days;then take 200 mg daily thereafter   apixaban (ELIQUIS) 5 MG TABS tablet Take 1 tablet (5 mg total) by mouth 2 (two) times daily.   aspirin EC 81 MG tablet Take 81 mg by mouth See admin instructions. 81mg  once daily for 5 days per week   atorvastatin (LIPITOR) 80 MG tablet Take 1 tablet (80 mg total) by mouth daily.   Cholecalciferol (VITAMIN D3) 1000 UNITS CAPS Take 1,000 Units by mouth daily.   cyanocobalamin 100 MCG tablet Take 100 mcg by mouth daily.   [START ON 10/04/2021] fish oil-omega-3 fatty acids 1000 MG capsule Take 1 capsule (1 g total) by mouth daily.   Magnesium 400 MG CAPS Take 400 mg by mouth daily.   metoprolol succinate (TOPROL-XL) 25 MG 24 hr tablet Take 1 tablet (25 mg total) by mouth daily.   Multiple Vitamin (MULTIVITAMINS PO) Take 1 tablet by mouth daily.     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   Tobacco Use   Smoking status: Never   Smokeless tobacco: Never  Substance and Sexual Activity   Alcohol use: Not Currently    Comment: Rarely   Drug use: No   Sexual activity: Not on file  Other Topics Concern   Not on file  Social History Narrative   Fun: Biomedical scientist, golf    Social Determinants of Health   Financial Resource Strain: Low Risk    Difficulty of Paying Living Expenses: Not  hard at all  Food Insecurity: No Food Insecurity   Worried About Charity fundraiser in the Last Year: Never true   Arboriculturist in the Last Year: Never true  Transportation Needs: No Transportation Needs   Lack of Transportation (Medical): No   Lack of Transportation (Non-Medical): No  Physical Activity: Inactive   Days of Exercise per Week: 0 days   Minutes of Exercise per Session: 0 min  Stress: No Stress Concern Present   Feeling of Stress : Not at all  Social Connections: Socially Integrated   Frequency of Communication with Friends and Family: More than three times a week   Frequency of Social Gatherings with Friends and Family: More than three times a week   Attends Religious Services: More than 4 times per year   Active Member of [employer] or Organizations: Yes   Attends Music therapist: More than 4 times per year   Marital Status: Married     Family History:  The patient's  family history includes Cancer in his maternal grandfather; Dementia in his father; Diabetes in his father and paternal grandfather; Heart attack (age of onset: 22) in his mother; Stroke in his paternal grandfather; Transient ischemic attack in his paternal uncle.   ROS:   Please see the history of present illness.    ROS All other systems reviewed and are negative.   PHYSICAL EXAM:   VS:  BP 130/64   Pulse 74   Ht 5\' 11"  (1.803 m)   Wt 212 lb 3.2 oz (96.3 kg)   SpO2 94%   BMI 29.60 kg/m   Physical Exam  GEN: Well nourished, well developed, in no acute distress  Neck: no JVD, carotid bruits, or masses Cardiac:RRR; no murmurs, rubs, or gallops  Respiratory:  clear to auscultation bilaterally, normal work of breathing GI: soft, nontender, nondistended, + BS Ext: without cyanosis, clubbing, or edema, Good distal pulses bilaterally Neuro:  Alert and Oriented x 3, Psych: euthymic mood, full affect  Wt Readings from Last 3 Encounters:  09/26/21 212 lb 3.2 oz (96.3 kg)  09/25/21 212  lb (96.2 kg)  09/14/21 208 lb 6.4 oz (94.5 kg)      Studies/Labs Reviewed:   EKG:  EKG is not ordered today.     Recent Labs: 10/23/2020: ALT 16; TSH 1.46 09/01/2021: BUN 17; Creatinine, Ser 0.85; Hemoglobin 10.6; Platelets 134; Potassium 4.4; Sodium 135 09/04/2021: Magnesium 2.4   Lipid Panel    Component Value Date/Time   CHOL 193 07/10/2021 0949   CHOL 214 (H) 10/01/2011 1400   TRIG 115.0 07/10/2021 0949   TRIG 131 10/01/2011 1400   HDL 42.70 07/10/2021 0949   HDL 52 10/01/2011 1400   CHOLHDL 5 07/10/2021 0949   VLDL 23.0 07/10/2021 0949   LDLCALC  127 (H) 07/10/2021 0949   LDLCALC 136 (H) 10/01/2011 1400    Additional studies/ records that were reviewed today include:  Cath: 08/23/21   Mid RCA lesion is 100% stenosed.   Mid LAD lesion is 95% stenosed.   Prox LAD to Mid LAD lesion is 75% stenosed.   1st Diag lesion is 70% stenosed.   Prox Cx lesion is 90% stenosed.   1st Mrg lesion is 75% stenosed.   The left ventricular systolic function is normal.   LV end diastolic pressure is normal.   The left ventricular ejection fraction is 55-65% by visual estimate.   There is no aortic valve stenosis.   Severe, complex, multivessel CAD.    Plan for cardiac surgery consult.     Start high potency statin.  Add low dose beta blocker. Restart IV heparin 8 hours post sheath pull.       Risk Assessment/Calculations:    CHA2DS2-VASc Score = 4   This indicates a 4.8% annual risk of stroke. The patient's score is based upon: CHF History: 0 HTN History: 1 Diabetes History: 0 Stroke History: 0 Vascular Disease History: 1 Age Score: 2 Gender Score: 0        ASSESSMENT:    1. S/P CABG x 4   2. Paroxysmal atrial fibrillation (HCC)   3. Other hyperlipidemia   4. Essential hypertension      PLAN:  In order of problems listed above:  CAD status post NSTEMI followed by CABG x4. Overall doing well.gradually increase exercise and to do cardiac rehab  Postop  atrial fibrillation with breakthrough on amiodarone.  Plan for Eliquis for 2 to 3 months as long as no recurrent A. fib.  Plavix was stopped. Being followed by Afib clinic.   HLD LDL 127 07/10/2021 started on atorvastatin 80 mg daily-check FLP in 2 months  Hypertension controlled on metoprolol  Shared Decision Making/Informed Consent        Medication Adjustments/Labs and Tests Ordered: Current medicines are reviewed at length with the patient today.  Concerns regarding medicines are outlined above.  Medication changes, Labs and Tests ordered today are listed in the Patient Instructions below. Patient Instructions  Medication Instructions:  Your physician recommends that you continue on your current medications as directed. Please refer to the Current Medication list given to you today.  *If you need a refill on your cardiac medications before your next appointment, please call your pharmacy*   Lab Work: None  If you have labs (blood work) drawn today and your tests are completely normal, you will receive your results only by: [place] (if you have MyChart) OR A paper copy in the mail If you have any lab test that is abnormal or we need to change your treatment, we will call you to review the results.   Follow-Up: At [facility], you and your health needs are our priority.  As part of our continuing mission to provide you with exceptional heart care, we have created designated Provider Care Teams.  These Care Teams include your primary Cardiologist (physician) and Advanced Practice Providers (APPs -  Physician Assistants and Nurse Practitioners) who all work together to provide you with the care you need, when you need it.   Your next appointment:   2 month(s) with Fasting Lipid Panel  The format for your next appointment:   In Person  Provider:   Larae Grooms, MD     Signed, Ermalinda Barrios, PA-C  09/26/2021 11:09 AM  [facility] [address], [place], [place]  [address] Phone: [phone]; Fax: [phone]

## 2021-09-12 NOTE — Telephone Encounter (Signed)
Pharmacy Transitions of Care Follow-up Telephone Call  Date of discharge: 09/04/21  Discharge Diagnosis: NSTEMI  How have you been since you were released from the hospital?  Patient doing well since discharge. No questions about meds at this time.  Medication changes made at discharge: START taking: amiodarone (PACERONE)  atorvastatin (LIPITOR)  Eliquis (apixaban)  furosemide (LASIX)  hydrocortisone cream  metoprolol succinate (TOPROL-XL)  potassium chloride SA (KLOR-CON)  traMADol (ULTRAM)   Medication changes verified by the patient? Yes    Medication Accessibility:  Home Pharmacy:  [facility] [place]  Was the patient provided with refills on discharged medications? Yes   Have all prescriptions been transferred from [facility] to home pharmacy?  Yes  Is the patient able to afford medications? Has insurance    Medication Review:  APIXABAN (ELIQUIS)  Apixaban 5 mg BID initiated on 09/04/21.  - Discussed importance of taking medication around the same time everyday  - Advised patient of medications to avoid (NSAIDs, ASA)  - Educated that Tylenol (acetaminophen) will be the preferred analgesic to prevent risk of bleeding  - Emphasized importance of monitoring for signs and symptoms of bleeding (abnormal bruising, prolonged bleeding, nose bleeds, bleeding from gums, discolored urine, black tarry stools)  - Advised patient to alert all providers of anticoagulation therapy prior to starting a new medication or having a procedure   Follow-up Appointments:  PCP Hospital f/u appt confirmed? Scheduled to see Dr. Larose Kells on 11/06/21 @ 10:00am.   [facility] f/u appt confirmed? Scheduled to see Dr. Marlene Lard on 09/13/21 @ 2:00pm.   If their condition worsens, is the pt aware to call PCP or go to the Emergency Dept.? yes  Final Patient Assessment: Patient has follow up scheduled and refills at home pharmacy

## 2021-09-13 ENCOUNTER — Other Ambulatory Visit: Payer: Self-pay

## 2021-09-13 ENCOUNTER — Encounter (HOSPITAL_COMMUNITY): Payer: Self-pay | Admitting: Physician Assistant

## 2021-09-13 ENCOUNTER — Ambulatory Visit (HOSPITAL_COMMUNITY)
Admit: 2021-09-13 | Discharge: 2021-09-13 | Disposition: A | Discharge status: Home / Self Care | Payer: Medicare Other | Attending: Physician Assistant | Admitting: Physician Assistant

## 2021-09-13 VITALS — BP 136/62 | HR 59 | Ht 71.0 in | Wt 212.2 lb

## 2021-09-13 DIAGNOSIS — I10 Essential (primary) hypertension: Secondary | ICD-10-CM | POA: Insufficient documentation

## 2021-09-13 DIAGNOSIS — I4510 Unspecified right bundle-branch block: Secondary | ICD-10-CM | POA: Insufficient documentation

## 2021-09-13 DIAGNOSIS — Y838 Other surgical procedures as the cause of abnormal reaction of the patient, or of later complication, without mention of misadventure at the time of the procedure: Secondary | ICD-10-CM | POA: Insufficient documentation

## 2021-09-13 DIAGNOSIS — Z7901 Long term (current) use of anticoagulants: Secondary | ICD-10-CM | POA: Insufficient documentation

## 2021-09-13 DIAGNOSIS — I214 Non-ST elevation (NSTEMI) myocardial infarction: Secondary | ICD-10-CM | POA: Diagnosis not present

## 2021-09-13 DIAGNOSIS — D6869 Other thrombophilia: Secondary | ICD-10-CM | POA: Diagnosis not present

## 2021-09-13 DIAGNOSIS — Z951 Presence of aortocoronary bypass graft: Secondary | ICD-10-CM | POA: Diagnosis not present

## 2021-09-13 DIAGNOSIS — I97190 Other postprocedural cardiac functional disturbances following cardiac surgery: Secondary | ICD-10-CM | POA: Insufficient documentation

## 2021-09-13 DIAGNOSIS — Z79899 Other long term (current) drug therapy: Secondary | ICD-10-CM | POA: Diagnosis not present

## 2021-09-13 DIAGNOSIS — I2510 Atherosclerotic heart disease of native coronary artery without angina pectoris: Secondary | ICD-10-CM | POA: Insufficient documentation

## 2021-09-13 DIAGNOSIS — I4891 Unspecified atrial fibrillation: Secondary | ICD-10-CM | POA: Diagnosis not present

## 2021-09-13 DIAGNOSIS — I480 Paroxysmal atrial fibrillation: Secondary | ICD-10-CM | POA: Diagnosis not present

## 2021-09-13 NOTE — Progress Notes (Signed)
Primary Care Physician: Colon Branch, MD Primary Cardiologist: Dr Irish Lack Primary Electrophysiologist: none Referring Physician: Dr Franki Monte is a 75 y.o. male with a history of CAD, HTN, atrial fibrillation who presents for consultation in the [facility].  The patient was initially diagnosed with atrial fibrillation post operatively following CABG for 3 vessel CAD. He presented to the ED 08/23/21 with symptoms of chest pain, diagnosed with NSTEMI. LHC showed 3 vessel disease and he underwent bypass surgery on 08/29/21. He found to have afib on 09/02/21 and was started on IV amiodarone. He converted to SR and was transitioned to oral amiodarone. He was also started on Eliquis for a CHADS2VASC score of 4. He reports that he has done well since leaving the hospital. He denies any tachypalpitations. No bleeding issues on anticoagulation.   Today, he denies symptoms of palpitations, chest pain, shortness of breath, orthopnea, PND, lower extremity edema, dizziness, presyncope, syncope, snoring, daytime somnolence, bleeding, or neurologic sequela. The patient is tolerating medications without difficulties and is otherwise without complaint today.    Atrial Fibrillation Risk Factors:  he does not have symptoms or diagnosis of sleep apnea. he does not have a history of rheumatic fever.   he has a BMI of Body mass index is 29.6 kg/m.Marland Kitchen Filed Weights   09/13/21 1348  Weight: 96.3 kg    Family History  Problem Relation Age of Onset   Cancer Maternal Grandfather        bladder   Diabetes Paternal Grandfather        TIAs; CVA   Stroke Paternal Grandfather        early 52s   Heart attack Mother 30   Transient ischemic attack Paternal Uncle    Dementia Father        CVAs   Diabetes Father        borderline   Colon cancer Neg Hx      Atrial Fibrillation Management history:  Previous antiarrhythmic drugs: amiodarone  Previous cardioversions:  none Previous ablations: none CHADS2VASC score: 4 Anticoagulation history: Eliquis   Past Medical History:  Diagnosis Date   Elevated blood-pressure reading without diagnosis of hypertension    Other and unspecified hyperlipidemia    Prostate cancer ([place]) 2011   Vitamin D deficiency    Past Surgical History:  Procedure Laterality Date   CORONARY ARTERY BYPASS GRAFT N/A 08/29/2021   Procedure: CORONARY ARTERY BYPASS GRAFTING (CABG)x 4 ON CARDIOPULMONARY BYPASS. LIMA TO LAD, SVG TO OM1, SVG TO PDA-OM2 SEQ.;  Surgeon: Melrose Nakayama, MD;  Location: [place];  Service: Open Heart Surgery;  Laterality: N/A;   ENDOVEIN HARVEST OF GREATER SAPHENOUS VEIN Right 08/29/2021   Procedure: ENDOVEIN HARVEST OF GREATER SAPHENOUS VEIN;  Surgeon: Melrose Nakayama, MD;  Location: [place];  Service: Open Heart Surgery;  Laterality: Right;   LEFT HEART CATH AND CORONARY ANGIOGRAPHY N/A 08/23/2021   Procedure: LEFT HEART CATH AND CORONARY ANGIOGRAPHY;  Surgeon: Jettie Booze, MD;  Location: [place] [facility];  Service: Cardiovascular;  Laterality: N/A;   PROSTATECTOMY  2011   Dr Darcus Austin, [facility]   TEE WITHOUT CARDIOVERSION N/A 08/29/2021   Procedure: TRANSESOPHAGEAL ECHOCARDIOGRAM (TEE);  Surgeon: Melrose Nakayama, MD;  Location: [place];  Service: Open Heart Surgery;  Laterality: N/A;    Current Outpatient Medications  Medication Sig Dispense Refill   amiodarone (PACERONE) 200 MG tablet Take 2 tablets (400 mg total) by mouth 2 (two) times  daily. For 12 days;then take 200 mg daily thereafter 120 tablet 0   apixaban (ELIQUIS) 5 MG TABS tablet Take 1 tablet (5 mg total) by mouth 2 (two) times daily. 60 tablet 1   aspirin EC 81 MG tablet Take 81 mg by mouth See admin instructions. 81mg  once daily for 5 days per week     atorvastatin (LIPITOR) 80 MG tablet Take 1 tablet (80 mg total) by mouth daily. 30 tablet 1   Cholecalciferol (VITAMIN D3) 1000 UNITS CAPS Take 1,000 Units by mouth daily.      cyanocobalamin 100 MCG tablet Take 100 mcg by mouth daily.     [START ON 10/04/2021] fish oil-omega-3 fatty acids 1000 MG capsule Take 1 capsule (1 g total) by mouth daily.     hydrocortisone cream 1 % Apply topically 2 (two) times daily. 30 g 0   Magnesium 400 MG CAPS Take 400 mg by mouth daily.     metoprolol succinate (TOPROL-XL) 25 MG 24 hr tablet Take 1 tablet (25 mg total) by mouth daily. 30 tablet 1   Multiple Vitamin (MULTIVITAMINS PO) Take 1 tablet by mouth daily.     traMADol (ULTRAM) 50 MG tablet Take 2 tablets (100 mg total) by mouth every 4 (four) hours as needed for moderate pain. 28 tablet 0   No current facility-administered medications for this encounter.    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   Tobacco Use   Smoking status: Never   Smokeless tobacco: Never  Substance and Sexual Activity   Alcohol use: Not Currently    Comment: Rarely   Drug use: No   Sexual activity: Not on file  Other Topics Concern   Not on file  Social History Narrative   Fun: Biomedical scientist, golf    Social Determinants of Health   Financial Resource Strain: Not on file  Food Insecurity: Not on file  Transportation Needs: Not on file  Physical Activity: Not on file  Stress: Not on file  Social Connections: Not on file  Intimate Partner Violence: Not on file     ROS- All systems are reviewed and negative except as per the HPI above.  Physical Exam: Vitals:   09/13/21 1348  BP: 136/62  Pulse: (!) 59  Weight: 96.3 kg  Height: 5\' 11"  (1.803 m)    GEN- The patient is a well appearing elderly male, alert and  oriented x 3 today.   Head- normocephalic, atraumatic Eyes-  Sclera clear, conjunctiva pink Ears- hearing intact Oropharynx- clear Neck- supple  Lungs- Clear to ausculation bilaterally, normal work of breathing Heart- Regular rate and rhythm, no murmurs, rubs or gallops  GI- soft, NT, ND, + BS Extremities- no clubbing, cyanosis, or edema MS- no significant deformity or atrophy Skin- no rash or lesion Psych- euthymic mood, full affect Neuro- strength and sensation are intact  Wt Readings from Last 3 Encounters:  09/13/21 96.3 kg  09/04/21 98 kg  07/10/21 96.2 kg    EKG today demonstrates  SB, RBBB  Vent. rate 59 BPM PR interval 156 ms QRS duration 136 ms QT/QTcB 482/477 ms  Echo 08/24/21 demonstrated  1. Left ventricular ejection fraction, by estimation, is 65 to 70%. The  left ventricle has normal function. The left ventricle has no regional  wall motion abnormalities. Left ventricular diastolic parameters were normal.   2. Right ventricular systolic function is normal. The right ventricular  size is mildly enlarged.   3. The mitral valve is grossly normal. No evidence of mitral valve  regurgitation.   4. The aortic valve is grossly normal. There is mild calcification of the aortic valve. Aortic valve regurgitation is mild.   Epic records are reviewed at length today  CHA2DS2-VASc Score = 4  The patient's score is based upon: CHF History: 0 HTN History: 1 Diabetes History: 0 Stroke History: 0 Vascular Disease History: 1 Age Score: 2 Gender Score: 0      ASSESSMENT AND PLAN: 1. Postoperative Atrial Fibrillation The patient's CHA2DS2-VASc score is 4, indicating a 4.8% annual risk of stroke.   Patient appears to be maintaining SR. Continue amiodarone 400 mg BID then decrease to 200 mg daily on 11/20.  Continue Eliquis 5 mg BID Will plan to continue amiodarone and Eliquis for at least 3 months. If he maintains SR, will plan on discontinuing.   2. Secondary  Hypercoagulable State (ICD10:  D68.69) The patient is at significant risk for stroke/thromboembolism based upon his CHA2DS2-VASc Score of 4.  Continue Apixaban (Eliquis) for now.  3. HTN Stable, no changes today.  4. CAD S/p CABG No anginal symptoms.   Follow up with Ermalinda Barrios and Dr [name] as scheduled. AF clinic in 3 months.    [facility] [address] [place], [place] [address] [phone] 09/13/2021 2:35 PM

## 2021-09-14 ENCOUNTER — Ambulatory Visit (INDEPENDENT_AMBULATORY_CARE_PROVIDER_SITE_OTHER): Payer: Medicare Other

## 2021-09-14 VITALS — Ht 71.0 in | Wt 208.4 lb

## 2021-09-14 DIAGNOSIS — Z0000 Encounter for general adult medical examination without abnormal findings: Secondary | ICD-10-CM | POA: Diagnosis not present

## 2021-09-14 NOTE — Progress Notes (Addendum)
Subjective:   Kyle Strickland is a 75 y.o. male who presents for an Initial Medicare Annual Wellness Visit.  I connected with Revanth today by telephone and verified that I am speaking with the correct person using two identifiers. Location patient: home Location provider: work Persons participating in the virtual visit: patient, Marine scientist.    I discussed the limitations, risks, security and privacy concerns of performing an evaluation and management service by telephone and the availability of in person appointments. I also discussed with the patient that there may be a patient responsible charge related to this service. The patient expressed understanding and verbally consented to this telephonic visit.    Interactive audio and video telecommunications were attempted between this provider and patient, however failed, due to patient having technical difficulties OR patient did not have access to video capability.  We continued and completed visit with audio only.  Some vital signs may be absent or patient reported.   Time Spent with patient on telephone encounter: 25 minutes   Review of Systems     Cardiac Risk Factors include: advanced age (>72men, >74 women);male gender;dyslipidemia     Objective:    Today's Vitals   09/14/21 1503  Weight: 208 lb 6.4 oz (94.5 kg)  Height: 5\' 11"  (1.803 m)   Body mass index is 29.07 kg/m.  Advanced Directives 09/14/2021 08/24/2021 08/23/2021  Does Patient Have a Medical Advance Directive? Yes Yes Yes  Type of Paramedic of [place];Living will Healthcare Power of [place]  Does patient want to make changes to medical advance directive? - No - Patient declined -  Copy of [place] in Chart? No - copy requested No - copy requested -    Current Medications (verified) Outpatient Encounter Medications as of 09/14/2021  Medication Sig   amiodarone (PACERONE) 200 MG tablet  Take 2 tablets (400 mg total) by mouth 2 (two) times daily. For 12 days;then take 200 mg daily thereafter   apixaban (ELIQUIS) 5 MG TABS tablet Take 1 tablet (5 mg total) by mouth 2 (two) times daily.   aspirin EC 81 MG tablet Take 81 mg by mouth See admin instructions. 81mg  once daily for 5 days per week   atorvastatin (LIPITOR) 80 MG tablet Take 1 tablet (80 mg total) by mouth daily.   Cholecalciferol (VITAMIN D3) 1000 UNITS CAPS Take 1,000 Units by mouth daily.   cyanocobalamin 100 MCG tablet Take 100 mcg by mouth daily.   [START ON 10/04/2021] fish oil-omega-3 fatty acids 1000 MG capsule Take 1 capsule (1 g total) by mouth daily.   hydrocortisone cream 1 % Apply topically 2 (two) times daily.   Magnesium 400 MG CAPS Take 400 mg by mouth daily.   metoprolol succinate (TOPROL-XL) 25 MG 24 hr tablet Take 1 tablet (25 mg total) by mouth daily.   Multiple Vitamin (MULTIVITAMINS PO) Take 1 tablet by mouth daily.   traMADol (ULTRAM) 50 MG tablet Take 2 tablets (100 mg total) by mouth every 4 (four) hours as needed for moderate pain.   No facility-administered encounter medications on file as of 09/14/2021.    Allergies (verified) Ciprofloxacin, Ditropan [oxybutynin chloride], and Codeine   History: Past Medical History:  Diagnosis Date   Elevated blood-pressure reading without diagnosis of hypertension    Other and unspecified hyperlipidemia    Prostate cancer ([place]) 2011   Vitamin D deficiency    Past Surgical History:  Procedure Laterality Date   CORONARY  ARTERY BYPASS GRAFT N/A 08/29/2021   Procedure: CORONARY ARTERY BYPASS GRAFTING (CABG)x 4 ON CARDIOPULMONARY BYPASS. LIMA TO LAD, SVG TO OM1, SVG TO PDA-OM2 SEQ.;  Surgeon: Melrose Nakayama, MD;  Location: [place];  Service: Open Heart Surgery;  Laterality: N/A;   ENDOVEIN HARVEST OF GREATER SAPHENOUS VEIN Right 08/29/2021   Procedure: ENDOVEIN HARVEST OF GREATER SAPHENOUS VEIN;  Surgeon: Melrose Nakayama, MD;  Location: [place];   Service: Open Heart Surgery;  Laterality: Right;   LEFT HEART CATH AND CORONARY ANGIOGRAPHY N/A 08/23/2021   Procedure: LEFT HEART CATH AND CORONARY ANGIOGRAPHY;  Surgeon: Jettie Booze, MD;  Location: [place] [facility];  Service: Cardiovascular;  Laterality: N/A;   PROSTATECTOMY  2011   Dr Darcus Austin, [facility]   TEE WITHOUT CARDIOVERSION N/A 08/29/2021   Procedure: TRANSESOPHAGEAL ECHOCARDIOGRAM (TEE);  Surgeon: Melrose Nakayama, MD;  Location: [place];  Service: Open Heart Surgery;  Laterality: N/A;   Family History  Problem Relation Age of Onset   Cancer Maternal Grandfather        bladder   Diabetes Paternal Grandfather        TIAs; CVA   Stroke Paternal Grandfather        early 86s   Heart attack Mother 40   Transient ischemic attack Paternal Uncle    Dementia Father        CVAs   Diabetes Father        borderline   Colon cancer Neg Hx    Social History   Socioeconomic History   Marital status: Married    Spouse name: Not on file   Number of children: 2   Years of education: 14   Highest education level: Not on file  Occupational History   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   Tobacco Use   Smoking status: Never   Smokeless tobacco: Never  Substance and Sexual Activity   Alcohol use: Not Currently    Comment: Rarely   Drug use: No   Sexual activity: Not on file  Other Topics Concern   Not on file  Social History Narrative   Fun: Biomedical scientist, golf    Social Determinants of Health   Financial Resource Strain: Low Risk    Difficulty of Paying Living Expenses: Not hard at all  Food Insecurity: No Food Insecurity   Worried About Charity fundraiser in the Last Year: Never true   Arboriculturist in the Last Year: Never true  Transportation Needs: No Transportation Needs   Lack of Transportation (Medical): No   Lack of Transportation (Non-Medical): No  Physical Activity: Inactive   Days of Exercise per Week: 0 days   Minutes of Exercise per Session: 0 min   Stress: No Stress Concern Present   Feeling of Stress : Not at all  Social Connections: Socially Integrated   Frequency of Communication with Friends and Family: More than three times a week   Frequency of Social Gatherings with Friends and Family: More than three times a week   Attends Religious Services: More than 4 times per year   Active Member of [employer] or Organizations: Yes   Attends Music therapist: More than 4 times per year   Marital Status: Married    Tobacco Counseling Counseling given: Not Answered   Clinical Intake:  Pre-visit preparation completed: Yes  Pain : No/denies pain     BMI - recorded: 29.07 Nutritional Status: BMI 25 -29 Overweight Nutritional Risks: None Diabetes: No  How often do you need to have someone help you when you read instructions, pamphlets, or other written materials from your doctor or pharmacy?: 1 - Never  Diabetic?No  Interpreter Needed?: No  Information entered by :: Caroleen Hamman LPN   Activities of Daily Living In your present state of health, do you have any difficulty performing the following activities: 09/14/2021 08/24/2021  Hearing? N Y  Comment - wears bilateral hearing aids  Vision? N N  Difficulty concentrating or making decisions? N N  Walking or climbing stairs? N N  Dressing or bathing? N N  Doing errands, shopping? N N  Preparing Food and eating ? N -  Using the Toilet? N -  In the past six months, have you accidently leaked urine? N -  Do you have problems with loss of bowel control? N -  Managing your Medications? N -  Managing your Finances? N -  Housekeeping or managing your Housekeeping? N -  Some recent data might be hidden    Patient Care Team: Colon Branch, MD as PCP - General (Internal Medicine) Jettie Booze, MD as PCP - Cardiology (Cardiology)  Indicate any recent Medical Services you may have received from other than [facility] providers in the past year (date may be  approximate).     Assessment:   This is a routine wellness examination for Kyle Strickland.  Hearing/Vision screen Hearing Screening - Comments:: Bilateral hearing aids Vision Screening - Comments:: Last eye exam-08/2020-[facility]  Dietary issues and exercise activities discussed: Current Exercise Habits: The patient does not participate in regular exercise at present   Goals Addressed             This Visit's Progress    Patient Stated       Maintain healthy diet       Depression Screen PHQ 2/9 Scores 09/14/2021 10/23/2020 12/06/2016 02/12/2013  PHQ - 2 Score 0 0 0 0    Fall Risk Fall Risk  09/14/2021 10/23/2020 12/06/2016 02/12/2013  Falls in the past year? 0 0 No No  Number falls in past yr: 0 0 - -  Injury with Fall? 0 0 - -  Follow up Falls prevention discussed - - -    FALL RISK PREVENTION PERTAINING TO THE HOME:  Any stairs in or around the home? No  Home free of loose throw rugs in walkways, pet beds, electrical cords, etc? Yes  Adequate lighting in your home to reduce risk of falls? Yes   ASSISTIVE DEVICES UTILIZED TO PREVENT FALLS:  Life alert? No  Use of a cane, walker or w/c? No  Grab bars in the bathroom? No  Shower chair or bench in shower? No  Elevated toilet seat or a handicapped toilet? Yes   TIMED UP AND GO:  Was the test performed? No . Phone visit   Cognitive Function:Normal cognitive status assessed by this Nurse Health Advisor. No abnormalities found.          Immunizations Immunization History  Administered Date(s) Administered   Fluad Quad(high Dose 65+) 10/23/2020   Influenza Inj Mdck Quad Pf 08/15/2018   Influenza, High Dose Seasonal PF 10/10/2017   PFIZER(Purple Top)SARS-COV-2 Vaccination 11/22/2019, 12/13/2019, 11/03/2020   Pneumococcal Conjugate-13 05/26/2015   Pneumococcal Polysaccharide-23 12/06/2016   Tdap 05/04/2020    TDAP status: Up to date  Flu Vaccine status: Due, Education has been provided regarding the  importance of this vaccine. Advised may receive this vaccine at local pharmacy or Health Dept. Aware to provide  a copy of the vaccination record if obtained from local pharmacy or Health Dept. Verbalized acceptance and understanding.  Pneumococcal vaccine status: Up to date  Covid-19 vaccine status: Information provided on how to obtain vaccines.   Qualifies for Shingles Vaccine? Yes   Zostavax completed No   Shingrix Completed?: No.    Education has been provided regarding the importance of this vaccine. Patient has been advised to call insurance company to determine out of pocket expense if they have not yet received this vaccine. Advised may also receive vaccine at local pharmacy or Health Dept. Verbalized acceptance and understanding.  Screening Tests Health Maintenance  Topic Date Due   Zoster Vaccines- Shingrix (1 of 2) Never done   COVID-19 Vaccine (4 - Booster for Pfizer series) 12/29/2020   INFLUENZA VACCINE  05/28/2021   TETANUS/TDAP  05/04/2030   Pneumonia Vaccine 34+ Years old  Completed   Hepatitis C Screening  Completed   HPV VACCINES  Aged Out    Health Maintenance  Health Maintenance Due  Topic Date Due   Zoster Vaccines- Shingrix (1 of 2) Never done   COVID-19 Vaccine (4 - Booster for Pfizer series) 12/29/2020   INFLUENZA VACCINE  05/28/2021    Colorectal cancer screening: No longer required.   Lung Cancer Screening: (Low Dose CT Chest recommended if Age 45-80 years, 30 pack-year currently smoking OR have quit w/in 15years.) does not qualify.     Additional Screening:  Hepatitis C Screening: Completed 12/06/2016  Vision Screening: Recommended annual ophthalmology exams for early detection of glaucoma and other disorders of the eye. Is the patient up to date with their annual eye exam?  Yes  Who is the provider or what is the name of the office in which the patient attends annual eye exams? [facility]   Dental Screening: Recommended annual dental exams  for proper oral hygiene  Community Resource Referral / Chronic Care Management: CRR required this visit?  No   CCM required this visit?  No      Plan:     I have personally reviewed and noted the following in the patient's chart:   Medical and social history Use of alcohol, tobacco or illicit drugs  Current medications and supplements including opioid prescriptions. Patient is not currently taking opioid prescriptions. Functional ability and status Nutritional status Physical activity Advanced directives List of other physicians Hospitalizations, surgeries, and ER visits in previous 12 months Vitals Screenings to include cognitive, depression, and falls Referrals and appointments  In addition, I have reviewed and discussed with patient certain preventive protocols, quality metrics, and best practice recommendations. A written personalized care plan for preventive services as well as general preventive health recommendations were provided to patient.   Due to this being a telephonic visit, the after visit summary with patients personalized plan was offered to patient via mail or my-chart.  Patient would like to access on my-chart.   Marta Antu, LPN   [id number]  Nurse Health Advisor  Nurse Notes: None   I have reviewed and agree with Health Coaches documentation.  Kathlene November, MD

## 2021-09-14 NOTE — Patient Instructions (Signed)
[name] , Thank you for taking time to complete your Medicare Wellness Visit. I appreciate your ongoing commitment to your health goals. Please review the following plan we discussed and let me know if I can assist you in the future.   Screening recommendations/referrals: Colonoscopy: No longer required Recommended yearly ophthalmology/optometry visit for glaucoma screening and checkup Recommended yearly dental visit for hygiene and checkup  Vaccinations: Influenza vaccine: Due-May obtain vaccine at our office or your local pharmacy. Pneumococcal vaccine: Up to date Tdap vaccine: Up to date Shingles vaccine: N/A-Never had chicken pox   Covid-19: Booster available at the pharmacy  Advanced directives: Please bring a copy of Living Will and/or Healthcare Power of Attorney for your chart.   Conditions/risks identified: See problem list  Next appointment: Follow up in one year for your annual wellness visit. 09/16/2022 @ 3:00  Preventive Care 76 Years and Older, Male Preventive care refers to lifestyle choices and visits with your health care provider that can promote health and wellness. What does preventive care include? A yearly physical exam. This is also called an annual well check. Dental exams once or twice a year. Routine eye exams. Ask your health care provider how often you should have your eyes checked. Personal lifestyle choices, including: Daily care of your teeth and gums. Regular physical activity. Eating a healthy diet. Avoiding tobacco and drug use. Limiting alcohol use. Practicing safe sex. Taking low doses of aspirin every day. Taking vitamin and mineral supplements as recommended by your health care provider. What happens during an annual well check? The services and screenings done by your health care provider during your annual well check will depend on your age, overall health, lifestyle risk factors, and family history of disease. Counseling  Your health  care provider may ask you questions about your: Alcohol use. Tobacco use. Drug use. Emotional well-being. Home and relationship well-being. Sexual activity. Eating habits. History of falls. Memory and ability to understand (cognition). Work and work Statistician. Screening  You may have the following tests or measurements: Height, weight, and BMI. Blood pressure. Lipid and cholesterol levels. These may be checked every 5 years, or more frequently if you are over 4 years old. Skin check. Lung cancer screening. You may have this screening every year starting at age 76 if you have a 30-pack-year history of smoking and currently smoke or have quit within the past 15 years. Fecal occult blood test (FOBT) of the stool. You may have this test every year starting at age 76. Flexible sigmoidoscopy or colonoscopy. You may have a sigmoidoscopy every 5 years or a colonoscopy every 10 years starting at age 76. Prostate cancer screening. Recommendations will vary depending on your family history and other risks. Hepatitis C blood test. Hepatitis B blood test. Sexually transmitted disease (STD) testing. Diabetes screening. This is done by checking your blood sugar (glucose) after you have not eaten for a while (fasting). You may have this done every 1-3 years. Abdominal aortic aneurysm (AAA) screening. You may need this if you are a current or former smoker. Osteoporosis. You may be screened starting at age 76 if you are at high risk. Talk with your health care provider about your test results, treatment options, and if necessary, the need for more tests. Vaccines  Your health care provider may recommend certain vaccines, such as: Influenza vaccine. This is recommended every year. Tetanus, diphtheria, and acellular pertussis (Tdap, Td) vaccine. You may need a Td booster every 10 years. Zoster vaccine. You may  need this after age 76. Pneumococcal 13-valent conjugate (PCV13) vaccine. One dose is  recommended after age 76. Pneumococcal polysaccharide (PPSV23) vaccine. One dose is recommended after age 76. Talk to your health care provider about which screenings and vaccines you need and how often you need them. This information is not intended to replace advice given to you by your health care provider. Make sure you discuss any questions you have with your health care provider. Document Released: 11/10/2015 Document Revised: 07/03/2016 Document Reviewed: 08/15/2015 Elsevier Interactive Patient Education  2017 [employer] Prevention in the Home Falls can cause injuries. They can happen to people of all ages. There are many things you can do to make your home safe and to help prevent falls. What can I do on the outside of my home? Regularly fix the edges of walkways and driveways and fix any cracks. Remove anything that might make you trip as you walk through a door, such as a raised step or threshold. Trim any bushes or trees on the path to your home. Use bright outdoor lighting. Clear any walking paths of anything that might make someone trip, such as rocks or tools. Regularly check to see if handrails are loose or broken. Make sure that both sides of any steps have handrails. Any raised decks and porches should have guardrails on the edges. Have any leaves, snow, or ice cleared regularly. Use sand or salt on walking paths during winter. Clean up any spills in your garage right away. This includes oil or grease spills. What can I do in the bathroom? Use night lights. Install grab bars by the toilet and in the tub and shower. Do not use towel bars as grab bars. Use non-skid mats or decals in the tub or shower. If you need to sit down in the shower, use a plastic, non-slip stool. Keep the floor dry. Clean up any water that spills on the floor as soon as it happens. Remove soap buildup in the tub or shower regularly. Attach bath mats securely with double-sided non-slip rug  tape. Do not have throw rugs and other things on the floor that can make you trip. What can I do in the bedroom? Use night lights. Make sure that you have a light by your bed that is easy to reach. Do not use any sheets or blankets that are too big for your bed. They should not hang down onto the floor. Have a firm chair that has side arms. You can use this for support while you get dressed. Do not have throw rugs and other things on the floor that can make you trip. What can I do in the kitchen? Clean up any spills right away. Avoid walking on wet floors. Keep items that you use a lot in easy-to-reach places. If you need to reach something above you, use a strong step stool that has a grab bar. Keep electrical cords out of the way. Do not use floor polish or wax that makes floors slippery. If you must use wax, use non-skid floor wax. Do not have throw rugs and other things on the floor that can make you trip. What can I do with my stairs? Do not leave any items on the stairs. Make sure that there are handrails on both sides of the stairs and use them. Fix handrails that are broken or loose. Make sure that handrails are as long as the stairways. Check any carpeting to make sure that it is firmly attached  to the stairs. Fix any carpet that is loose or worn. Avoid having throw rugs at the top or bottom of the stairs. If you do have throw rugs, attach them to the floor with carpet tape. Make sure that you have a light switch at the top of the stairs and the bottom of the stairs. If you do not have them, ask someone to add them for you. What else can I do to help prevent falls? Wear shoes that: Do not have high heels. Have rubber bottoms. Are comfortable and fit you well. Are closed at the toe. Do not wear sandals. If you use a stepladder: Make sure that it is fully opened. Do not climb a closed stepladder. Make sure that both sides of the stepladder are locked into place. Ask someone to  hold it for you, if possible. Clearly mark and make sure that you can see: Any grab bars or handrails. First and last steps. Where the edge of each step is. Use tools that help you move around (mobility aids) if they are needed. These include: Canes. Walkers. Scooters. Crutches. Turn on the lights when you go into a dark area. Replace any light bulbs as soon as they burn out. Set up your furniture so you have a clear path. Avoid moving your furniture around. If any of your floors are uneven, fix them. If there are any pets around you, be aware of where they are. Review your medicines with your doctor. Some medicines can make you feel dizzy. This can increase your chance of falling. Ask your doctor what other things that you can do to help prevent falls. This information is not intended to replace advice given to you by your health care provider. Make sure you discuss any questions you have with your health care provider. Document Released: 08/10/2009 Document Revised: 03/21/2016 Document Reviewed: 11/18/2014 Elsevier Interactive Patient Education  2017 [employer].

## 2021-09-17 ENCOUNTER — Telehealth (HOSPITAL_COMMUNITY): Payer: Self-pay | Admitting: *Deleted

## 2021-09-17 NOTE — Telephone Encounter (Signed)
Returned call from message left on departmental voice mail.  Advised pt to disregard the  text messages that he receives from Cardiac rehab.  These text messages are generated outside of the department and do not indicate readiness to schedule. Pt has upcoming appt to complete with cardiology and the heart surgeon.  Pt aware he will need to complete these appts first.  Pt given general exercise guidelines for progressive ambulation.  Verbalized understanding. Cherre Huger, BSN Cardiac and Training and development officer

## 2021-09-24 ENCOUNTER — Other Ambulatory Visit: Payer: Self-pay | Admitting: Thoracic Surgery (Cardiothoracic Vascular Surgery)

## 2021-09-24 DIAGNOSIS — Z951 Presence of aortocoronary bypass graft: Secondary | ICD-10-CM

## 2021-09-25 ENCOUNTER — Other Ambulatory Visit: Payer: Self-pay

## 2021-09-25 ENCOUNTER — Other Ambulatory Visit: Payer: Self-pay | Admitting: *Deleted

## 2021-09-25 ENCOUNTER — Ambulatory Visit (INDEPENDENT_AMBULATORY_CARE_PROVIDER_SITE_OTHER): Payer: Self-pay | Admitting: Thoracic Surgery (Cardiothoracic Vascular Surgery)

## 2021-09-25 ENCOUNTER — Ambulatory Visit: Payer: Medicare Other | Admitting: Physician Assistant

## 2021-09-25 ENCOUNTER — Encounter: Payer: Self-pay | Admitting: Thoracic Surgery (Cardiothoracic Vascular Surgery)

## 2021-09-25 ENCOUNTER — Ambulatory Visit
Admission: RE | Admit: 2021-09-25 | Discharge: 2021-09-25 | Disposition: A | Discharge status: Home / Self Care | Payer: Medicare Other | Source: Ambulatory Visit | Attending: Thoracic Surgery (Cardiothoracic Vascular Surgery) | Admitting: Thoracic Surgery (Cardiothoracic Vascular Surgery)

## 2021-09-25 VITALS — BP 121/74 | HR 64 | Resp 20 | Ht 71.0 in | Wt 212.0 lb

## 2021-09-25 DIAGNOSIS — I517 Cardiomegaly: Secondary | ICD-10-CM | POA: Diagnosis not present

## 2021-09-25 DIAGNOSIS — Z951 Presence of aortocoronary bypass graft: Secondary | ICD-10-CM

## 2021-09-25 DIAGNOSIS — J90 Pleural effusion, not elsewhere classified: Secondary | ICD-10-CM | POA: Diagnosis not present

## 2021-09-25 DIAGNOSIS — J9811 Atelectasis: Secondary | ICD-10-CM | POA: Diagnosis not present

## 2021-09-25 NOTE — Progress Notes (Signed)
[address]       ,[place] [address]             [phone]       HPI: Kyle Strickland returns for scheduled follow-up visit after recent coronary bypass grafting  Kyle Strickland is a 75 year old man with a history of prostate cancer and hypertension.  He presented with left arm pain.  He ruled in for non-ST elevation MI.  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  I did CABG x4 on 08/29/2021.  His postoperative course was complicated by atrial fibrillation.  He ultimately went home on day 8.  He was in sinus rhythm at discharge.  He was sent home on amiodarone and Eliquis.  Currently he is feeling well.  He has a little pain in the left upper chest he is noted some redness at the upper part of his incision over the last couple of days.  No fevers or chills.  No recurrent angina.  He is anxious to resume normal activities.  Past Medical History:  Diagnosis Date   Elevated blood-pressure reading without diagnosis of hypertension    Other and unspecified hyperlipidemia    Prostate cancer ([place]) 2011   Vitamin D deficiency     Current Outpatient Medications  Medication Sig Dispense Refill   amiodarone (PACERONE) 200 MG tablet Take 2 tablets (400 mg total) by mouth 2 (two) times daily. For 12 days;then take 200 mg daily thereafter 120 tablet 0   apixaban (ELIQUIS) 5 MG TABS tablet Take 1 tablet (5 mg total) by mouth 2 (two) times daily. 60 tablet 1   aspirin EC 81 MG tablet Take 81 mg by mouth See admin instructions. 81mg  once daily for 5 days per week     atorvastatin (LIPITOR) 80 MG tablet Take 1 tablet (80 mg total) by mouth daily. 30 tablet 1   Cholecalciferol (VITAMIN D3) 1000 UNITS CAPS Take 1,000 Units by mouth daily.     cyanocobalamin 100 MCG tablet Take 100 mcg by mouth daily.     [START ON 10/04/2021] fish oil-omega-3 fatty acids 1000 MG capsule Take 1 capsule (1 g total) by mouth daily.     Magnesium 400 MG CAPS Take 400  mg by mouth daily.     metoprolol succinate (TOPROL-XL) 25 MG 24 hr tablet Take 1 tablet (25 mg total) by mouth daily. 30 tablet 1   Multiple Vitamin (MULTIVITAMINS PO) Take 1 tablet by mouth daily.     hydrocortisone cream 1 % Apply topically 2 (two) times daily. 30 g 0   traMADol (ULTRAM) 50 MG tablet Take 2 tablets (100 mg total) by mouth every 4 (four) hours as needed for moderate pain. 28 tablet 0   No current facility-administered medications for this visit.    Physical Exam BP 121/74   Pulse 64   Resp 20   Ht 5\' 11"  (1.803 m)   Wt 212 lb (96.2 kg)   SpO2 96% Comment: RA  BMI 29.17 kg/m  75 year old man in no acute distress Alert and oriented x3 with no focal deficits Cardiac regular rate and rhythm, normal S1 and S2 Sternal incision healing well with mild erythema upper 2 cm, no drainage or tenderness Lungs clear  Diagnostic Tests: Personally reviewed the chest x-ray images.  There are postoperative changes and a very small left pleural effusion.  Impression: Kyle Strickland is a 75 year old man who presented with new onset angina.  He underwent  cardiac catheterization and was found to have three-vessel disease.  Coronary bypass grafting x4 on 08/29/2021.  He had atrial fibrillation postoperatively which delayed his discharge but otherwise his recovery was unremarkable.  He currently is doing very well.  He may begin driving on a limited basis.  He should not lift anything over 10 pounds for another 2 weeks and nothing over 20 pounds before the first of the year.  He may begin cardiac rehab whenever they have a physician open for him.  Importance of lifestyle modification was emphasized given the diffuse nature of his coronary disease.  Postoperative atrial fibrillation-currently in sinus rhythm.  Remains on amiodarone and Eliquis.  He will follow-up with cardiology on that.  Plan: Will apply topical antibiotic and keep the upper part of the incision covered to prevent  irritation from close rubbing on it.  He knows to call immediately if that were to become more red or have any drainage from it.  I will be happy to see Kyle Strickland back anytime in the future if I can be of any further assistance with his care  Melrose Nakayama, MD [facility] [phone]

## 2021-09-26 ENCOUNTER — Encounter: Payer: Self-pay | Admitting: Physician Assistant

## 2021-09-26 ENCOUNTER — Ambulatory Visit: Payer: Medicare Other | Admitting: Physician Assistant

## 2021-09-26 VITALS — BP 130/64 | HR 74 | Ht 71.0 in | Wt 212.2 lb

## 2021-09-26 DIAGNOSIS — I10 Essential (primary) hypertension: Secondary | ICD-10-CM

## 2021-09-26 DIAGNOSIS — Z951 Presence of aortocoronary bypass graft: Secondary | ICD-10-CM

## 2021-09-26 DIAGNOSIS — I480 Paroxysmal atrial fibrillation: Secondary | ICD-10-CM | POA: Diagnosis not present

## 2021-09-26 DIAGNOSIS — E7849 Other hyperlipidemia: Secondary | ICD-10-CM

## 2021-09-26 NOTE — Patient Instructions (Addendum)
Medication Instructions:  Your physician recommends that you continue on your current medications as directed. Please refer to the Current Medication list given to you today.  *If you need a refill on your cardiac medications before your next appointment, please call your pharmacy*   Lab Work: None  If you have labs (blood work) drawn today and your tests are completely normal, you will receive your results only by: [place] (if you have MyChart) OR A paper copy in the mail If you have any lab test that is abnormal or we need to change your treatment, we will call you to review the results.   Follow-Up: At [facility], you and your health needs are our priority.  As part of our continuing mission to provide you with exceptional heart care, we have created designated Provider Care Teams.  These Care Teams include your primary Cardiologist (physician) and Advanced Practice Providers (APPs -  Physician Assistants and Nurse Practitioners) who all work together to provide you with the care you need, when you need it.   Your next appointment:   2 month(s) with Fasting Lipid Panel  The format for your next appointment:   In Person  Provider:   Larae Grooms, MD

## 2021-09-26 NOTE — Addendum Note (Signed)
Addended by: Carylon Perches on: 09/26/2021 11:13 AM   Modules accepted: Orders

## 2021-10-18 ENCOUNTER — Telehealth (HOSPITAL_COMMUNITY): Payer: Self-pay

## 2021-10-23 ENCOUNTER — Encounter (HOSPITAL_COMMUNITY)
Admission: RE | Admit: 2021-10-23 | Discharge: 2021-10-23 | Disposition: A | Discharge status: Home / Self Care | Payer: Medicare Other | Source: Ambulatory Visit | Attending: Interventional Cardiology | Admitting: Interventional Cardiology

## 2021-10-23 ENCOUNTER — Other Ambulatory Visit: Payer: Self-pay

## 2021-10-23 VITALS — BP 140/60 | HR 58 | Ht 68.25 in | Wt 211.4 lb

## 2021-10-23 DIAGNOSIS — Z951 Presence of aortocoronary bypass graft: Secondary | ICD-10-CM | POA: Insufficient documentation

## 2021-10-23 DIAGNOSIS — I214 Non-ST elevation (NSTEMI) myocardial infarction: Secondary | ICD-10-CM | POA: Insufficient documentation

## 2021-10-23 NOTE — Progress Notes (Signed)
Cardiac Individual Treatment Plan  Patient Details  Name: Kyle Strickland MRN: [id number] Date of Birth: [birth year] Referring Provider:   Flowsheet Row CARDIAC REHAB PHASE II ORIENTATION from 10/23/2021 in [place]  Referring Provider Jettie Booze, MD       Initial Encounter Date:  [place] PHASE II ORIENTATION from 10/23/2021 in 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 For 12 days;then take 200 mg daily thereafter (Patient taking differently: Take 200 mg by mouth in the morning. For 12 days;then take 200 mg daily thereafter), Disp: 120 tablet, Rfl: 0   apixaban (ELIQUIS) 5 MG TABS tablet, Take 1 tablet (5 mg total) by mouth 2 (two) times daily., Disp: 60 tablet, Rfl: 1   aspirin EC 81 MG tablet, Take 81 mg by mouth every Monday, Tuesday, Wednesday, Thursday, and Friday. In the evening., Disp: , Rfl:    atorvastatin (LIPITOR) 80 MG tablet, Take 1 tablet (80 mg total) by mouth daily., Disp: 30 tablet, Rfl: 1   Cholecalciferol (VITAMIN D3) 1000 UNITS CAPS, Take 1,000 Units by mouth daily., Disp: , Rfl:    Magnesium 400 MG CAPS, Take 400 mg by mouth daily., Disp: , Rfl:    metoprolol succinate (TOPROL-XL) 25 MG 24 hr tablet, Take 1 tablet (25 mg total) by mouth daily., Disp: 30 tablet, Rfl: 1   Multiple Vitamin (MULTIVITAMINS PO), Take 1 tablet by mouth daily., Disp: , Rfl:    cyanocobalamin 100 MCG tablet, Take 100 mcg by mouth daily., Disp: , Rfl:    fish oil-omega-3 fatty acids 1000 MG capsule, Take 1 capsule (1 g total) by mouth daily., Disp: , Rfl:    hydrocortisone cream 1 %, Apply topically 2 (two) times daily. (Patient not taking: Reported on 10/17/2021), Disp: 30 g, Rfl:  0   traMADol (ULTRAM) 50 MG tablet, Take 2 tablets (100 mg total) by mouth every 4 (four) hours as needed for moderate pain. (Patient not taking: Reported on 10/17/2021), Disp: 28 tablet, Rfl: 0  Past Medical History: Past Medical History:  Diagnosis Date   Elevated blood-pressure reading without diagnosis of hypertension    Other and unspecified hyperlipidemia    Prostate cancer ([place]) 2011   Vitamin D deficiency     Tobacco Use: Social History   Tobacco Use  Smoking Status Never  Smokeless Tobacco Never    Labs: Recent Review Flowsheet Data     Labs for ITP Cardiac and Pulmonary Rehab Latest Ref Rng & Units 08/29/2021 08/29/2021 08/29/2021 08/29/2021 08/29/2021   Cholestrol 0 - 200 mg/dL - - - - -   LDLCALC 0 - 99 mg/dL - - - - -   HDL >39.00 mg/dL - - - - -   Trlycerides 0.0 - 149.0 mg/dL - - - - -   Hemoglobin A1c 4.8 - 5.6 % - - - - -   PHART 7.350 - 7.450 - - 7.339(L) 7.356 7.352   PCO2ART 32.0 - 48.0 mmHg - - 44.5 40.0 39.6   HCO3 20.0 - 28.0 mmol/L - - 24.3 22.4 22.1   TCO2 22 - 32 mmol/L 26 24 26 24 23    ACIDBASEDEF 0.0 - 2.0 mmol/L - - 2.0 3.0(H) 3.0(H)  O2SAT % - - 99.0 98.0 98.0       Capillary Blood Glucose: Lab Results  Component Value Date   GLUCAP 100 (H) 09/04/2021   GLUCAP 127 (H) 09/03/2021   GLUCAP 151 (H) 09/03/2021   GLUCAP 108 (H) 09/03/2021   GLUCAP 114 (H) 09/03/2021     Exercise Target Goals: Exercise Program Goal: Individual exercise prescription set using results from initial 6 min walk test and THRR while considering  patients activity barriers and safety.   Exercise Prescription Goal: Initial exercise prescription builds to 30-45 minutes a day of aerobic activity, 2-3 days per week.  Home exercise guidelines will be given to patient during program as part of exercise prescription that the participant will acknowledge.  Activity Barriers & Risk Stratification:  Activity Barriers & Cardiac Risk Stratification - 10/23/21 0900        Activity Barriers & Cardiac Risk Stratification   Activity Barriers Arthritis;Other (comment)    Comments Arthritis-low back    Cardiac Risk Stratification Moderate             6 Minute Walk:  6 Minute Walk     Row Name 10/23/21 0923         6 Minute Walk   Phase Initial     Distance 1693 feet     Walk Time 6 minutes     # of Rest Breaks 0     MPH 3.21     METS 3.11     RPE 11     Perceived Dyspnea  0     VO2 Peak 10.88     Symptoms No     Resting HR 58 bpm     Resting BP 140/60     Resting Oxygen Saturation  98 %     Exercise Oxygen Saturation  during 6 min walk 97 %     Max Ex. HR 91 bpm     Max Ex. BP 158/72     2 Minute Post BP 138/72              Oxygen Initial Assessment:   Oxygen Re-Evaluation:   Oxygen Discharge (Final Oxygen Re-Evaluation):   Initial Exercise Prescription:  Initial Exercise Prescription - 10/23/21 0900       Date of Initial Exercise RX and Referring Provider   Date 10/23/21    Referring Provider Jettie Booze, MD    Expected Discharge Date 12/21/21      Bike   Level 1.1    Minutes 15    METs 3.16      NuStep   Level 3    SPM 85    Minutes 15    METs 3.2      Prescription Details   Frequency (times per week) 3    Duration Progress to 30 minutes of continuous aerobic without signs/symptoms of physical distress      Intensity   THRR 40-80% of Max Heartrate 58-115    Ratings of Perceived Exertion 11-13    Perceived Dyspnea 0-4      Progression   Progression Continue to progress workloads to maintain intensity without signs/symptoms of physical distress.      Resistance Training   Training Prescription Yes    Weight 4 lbs    Reps 10-15             Perform Capillary Blood Glucose checks as needed.  Exercise Prescription Changes:   Exercise Comments:   Exercise Goals and Review:   Exercise  Goals     Row Name 10/23/21 0837             Exercise Goals   Increase Physical Activity  Yes       Intervention Provide advice, education, support and counseling about physical activity/exercise needs.;Develop an individualized exercise prescription for aerobic and resistive training based on initial evaluation findings, risk stratification, comorbidities and participant's personal goals.       Expected Outcomes Short Term: Attend rehab on a regular basis to increase amount of physical activity.;Long Term: Exercising regularly at least 3-5 days a week.;Long Term: Add in home exercise to make exercise part of routine and to increase amount of physical activity.       Increase Strength and Stamina Yes       Intervention Provide advice, education, support and counseling about physical activity/exercise needs.;Develop an individualized exercise prescription for aerobic and resistive training based on initial evaluation findings, risk stratification, comorbidities and participant's personal goals.       Expected Outcomes Short Term: Increase workloads from initial exercise prescription for resistance, speed, and METs.;Short Term: Perform resistance training exercises routinely during rehab and add in resistance training at home;Long Term: Improve cardiorespiratory fitness, muscular endurance and strength as measured by increased METs and functional capacity (6MWT)       Able to understand and use rate of perceived exertion (RPE) scale Yes       Intervention Provide education and explanation on how to use RPE scale       Expected Outcomes Short Term: Able to use RPE daily in rehab to express subjective intensity level;Long Term:  Able to use RPE to guide intensity level when exercising independently       Knowledge and understanding of Target Heart Rate Range (THRR) Yes       Intervention Provide education and explanation of THRR including how the numbers were predicted and where they are located for reference       Expected Outcomes Short Term: Able to state/look up THRR;Long Term: Able to use  THRR to govern intensity when exercising independently;Short Term: Able to use daily as guideline for intensity in rehab       Able to check pulse independently Yes       Intervention Review the importance of being able to check your own pulse for safety during independent exercise;Provide education and demonstration on how to check pulse in carotid and radial arteries.       Expected Outcomes Short Term: Able to explain why pulse checking is important during independent exercise;Long Term: Able to check pulse independently and accurately       Understanding of Exercise Prescription Yes       Intervention Provide education, explanation, and written materials on patient's individual exercise prescription       Expected Outcomes Short Term: Able to explain program exercise prescription;Long Term: Able to explain home exercise prescription to exercise independently                Exercise Goals Re-Evaluation :   Discharge Exercise Prescription (Final Exercise Prescription Changes):   Nutrition:  Target Goals: Understanding of nutrition guidelines, daily intake of sodium 1500mg , cholesterol 200mg , calories 30% from fat and 7% or less from saturated fats, daily to have 5 or more servings of fruits and vegetables.  Biometrics:  Pre Biometrics - 10/23/21 0825       Pre Biometrics   Waist Circumference 43.5 inches    Hip Circumference 44 inches  Waist to Hip Ratio 0.99 %    Triceps Skinfold 22 mm    % Body Fat 32.6 %    Grip Strength 36 kg    Flexibility 18.25 in    Single Leg Stand 1.37 seconds              Nutrition Therapy Plan and Nutrition Goals:   Nutrition Assessments:  MEDIFICTS Score Key: ?70 Need to make dietary changes  40-70 Heart Healthy Diet ? 40 Therapeutic Level Cholesterol Diet    Picture Your Plate Scores: <08 Unhealthy dietary pattern with much room for improvement. 41-50 Dietary pattern unlikely to meet recommendations for good health and room  for improvement. 51-60 More healthful dietary pattern, with some room for improvement.  >60 Healthy dietary pattern, although there may be some specific behaviors that could be improved.    Nutrition Goals Re-Evaluation:   Nutrition Goals Re-Evaluation:   Nutrition Goals Discharge (Final Nutrition Goals Re-Evaluation):   Psychosocial: Target Goals: Acknowledge presence or absence of significant depression and/or stress, maximize coping skills, provide positive support system. Participant is able to verbalize types and ability to use techniques and skills needed for reducing stress and depression.  Initial Review & Psychosocial Screening:  Initial Psych Review & Screening - 10/23/21 1200       Initial Review   Current issues with None Identified      Family Dynamics   Good Support System? Yes    Comments Yvone Neu has a great support system which includes his wife who is a retired Teaching laboratory technician   Psychosocial barriers to participate in program There are no identifiable barriers or psychosocial needs.      Screening Interventions   Interventions Encouraged to exercise;Provide feedback about the scores to participant    Expected Outcomes Short Term goal: Identification and review with participant of any Quality of Life or Depression concerns found by scoring the questionnaire.;Long Term goal: The participant improves quality of Life and PHQ9 Scores as seen by post scores and/or verbalization of changes             Quality of Life Scores:  Quality of Life - 10/23/21 1004       Quality of Life   Select Quality of Life      Quality of Life Scores   Health/Function Pre 24.27 %    Socioeconomic Pre 23.43 %    Psych/Spiritual Pre 24 %    Family Pre 24.7 %    GLOBAL Pre 24.1 %            Scores of 19 and below usually indicate a poorer quality of life in these areas.  A difference of  2-3 points is a clinically meaningful difference.  A difference of 2-3 points in  the total score of the Quality of Life Index has been associated with significant improvement in overall quality of life, self-image, physical symptoms, and general health in studies assessing change in quality of life.  PHQ-9: Recent Review Flowsheet Data     Depression screen [facility] 2/9 10/23/2021 09/14/2021 10/23/2020 12/06/2016 02/12/2013   Decreased Interest 0 0 0 0 0   Down, Depressed, Hopeless 1 0 0 0 0   PHQ - 2 Score 1 0 0 0 0   Altered sleeping 0 - - - -   Tired, decreased energy 1 - - - -   Change in appetite 0 - - - -   Feeling bad or failure about yourself  0 - - - -   Trouble concentrating 0 - - - -   Moving slowly or fidgety/restless 0 - - - -   Suicidal thoughts 0 - - - -   PHQ-9 Score 2 - - - -   Difficult doing work/chores Not difficult at all - - - -      Interpretation of Total Score  Total Score Depression Severity:  1-4 = Minimal depression, 5-9 = Mild depression, 10-14 = Moderate depression, 15-19 = Moderately severe depression, 20-27 = Severe depression   Psychosocial Evaluation and Intervention:   Psychosocial Re-Evaluation:   Psychosocial Discharge (Final Psychosocial Re-Evaluation):   Vocational Rehabilitation: Provide vocational rehab assistance to qualifying candidates.   Vocational Rehab Evaluation & Intervention:   Education: Education Goals: Education classes will be provided on a weekly basis, covering required topics. Participant will state understanding/return demonstration of topics presented.  Learning Barriers/Preferences:   Education Topics: Count Your Pulse:  -Group instruction provided by verbal instruction, demonstration, patient participation and written materials to support subject.  Instructors address importance of being able to find your pulse and how to count your pulse when at home without a heart monitor.  Patients get hands on experience counting their pulse with staff help and individually.   Heart Attack, Angina, and  Risk Factor Modification:  -Group instruction provided by verbal instruction, video, and written materials to support subject.  Instructors address signs and symptoms of angina and heart attacks.    Also discuss risk factors for heart disease and how to make changes to improve heart health risk factors.   Functional Fitness:  -Group instruction provided by verbal instruction, demonstration, patient participation, and written materials to support subject.  Instructors address safety measures for doing things around the house.  Discuss how to get up and down off the floor, how to pick things up properly, how to safely get out of a chair without assistance, and balance training.   Meditation and Mindfulness:  -Group instruction provided by verbal instruction, patient participation, and written materials to support subject.  Instructor addresses importance of mindfulness and meditation practice to help reduce stress and improve awareness.  Instructor also leads participants through a meditation exercise.    Stretching for Flexibility and Mobility:  -Group instruction provided by verbal instruction, patient participation, and written materials to support subject.  Instructors lead participants through series of stretches that are designed to increase flexibility thus improving mobility.  These stretches are additional exercise for major muscle groups that are typically performed during regular warm up and cool down.   Hands Only CPR:  -Group verbal, video, and participation provides a basic overview of AHA guidelines for community CPR. Role-play of emergencies allow participants the opportunity to practice calling for help and chest compression technique with discussion of AED use.   Hypertension: -Group verbal and written instruction that provides a basic overview of hypertension including the most recent diagnostic guidelines, risk factor reduction with self-care instructions and medication  management.    Nutrition I class: Heart Healthy Eating:  -Group instruction provided by PowerPoint slides, verbal discussion, and written materials to support subject matter. The instructor gives an explanation and review of the Therapeutic Lifestyle Changes diet recommendations, which includes a discussion on lipid goals, dietary fat, sodium, fiber, plant stanol/sterol esters, sugar, and the components of a well-balanced, healthy diet.   Nutrition II class: Lifestyle Skills:  -Group instruction provided by PowerPoint slides, verbal discussion, and written materials to support subject matter. The instructor gives  an explanation and review of label reading, grocery shopping for heart health, heart healthy recipe modifications, and ways to make healthier choices when eating out.   Diabetes Question & Answer:  -Group instruction provided by PowerPoint slides, verbal discussion, and written materials to support subject matter. The instructor gives an explanation and review of diabetes co-morbidities, pre- and post-prandial blood glucose goals, pre-exercise blood glucose goals, signs, symptoms, and treatment of hypoglycemia and hyperglycemia, and foot care basics.   Diabetes Blitz:  -Group instruction provided by PowerPoint slides, verbal discussion, and written materials to support subject matter. The instructor gives an explanation and review of the physiology behind type 1 and type 2 diabetes, diabetes medications and rational behind using different medications, pre- and post-prandial blood glucose recommendations and Hemoglobin A1c goals, diabetes diet, and exercise including blood glucose guidelines for exercising safely.    Portion Distortion:  -Group instruction provided by PowerPoint slides, verbal discussion, written materials, and food models to support subject matter. The instructor gives an explanation of serving size versus portion size, changes in portions sizes over the last 20 years,  and what consists of a serving from each food group.   Stress Management:  -Group instruction provided by verbal instruction, video, and written materials to support subject matter.  Instructors review role of stress in heart disease and how to cope with stress positively.     Exercising on Your Own:  -Group instruction provided by verbal instruction, power point, and written materials to support subject.  Instructors discuss benefits of exercise, components of exercise, frequency and intensity of exercise, and end points for exercise.  Also discuss use of nitroglycerin and activating EMS.  Review options of places to exercise outside of rehab.  Review guidelines for sex with heart disease.   Cardiac Drugs I:  -Group instruction provided by verbal instruction and written materials to support subject.  Instructor reviews cardiac drug classes: antiplatelets, anticoagulants, beta blockers, and statins.  Instructor discusses reasons, side effects, and lifestyle considerations for each drug class.   Cardiac Drugs II:  -Group instruction provided by verbal instruction and written materials to support subject.  Instructor reviews cardiac drug classes: angiotensin converting enzyme inhibitors (ACE-I), angiotensin II receptor blockers (ARBs), nitrates, and calcium channel blockers.  Instructor discusses reasons, side effects, and lifestyle considerations for each drug class.   Anatomy and Physiology of the Circulatory System:  Group verbal and written instruction and models provide basic cardiac anatomy and physiology, with the coronary electrical and arterial systems. Review of: AMI, Angina, Valve disease, Heart Failure, Peripheral Artery Disease, Cardiac Arrhythmia, Pacemakers, and the ICD.   Other Education:  -Group or individual verbal, written, or video instructions that support the educational goals of the cardiac rehab program.   Holiday Eating Survival Tips:  -Group instruction provided by  PowerPoint slides, verbal discussion, and written materials to support subject matter. The instructor gives patients tips, tricks, and techniques to help them not only survive but enjoy the holidays despite the onslaught of food that accompanies the holidays.   Knowledge Questionnaire Score:  Knowledge Questionnaire Score - 10/23/21 1004       Knowledge Questionnaire Score   Pre Score 19/24             Core Components/Risk Factors/Patient Goals at Admission:  Personal Goals and Risk Factors at Admission - 10/23/21 0836       Core Components/Risk Factors/Patient Goals on Admission    Weight Management Yes;Obesity;Weight Maintenance;Weight Loss    Intervention Weight Management/Obesity: Establish  reasonable short term and long term weight goals.;Obesity: Provide education and appropriate resources to help participant work on and attain dietary goals.;Weight Management: Provide education and appropriate resources to help participant work on and attain dietary goals.    Admit Weight 211 lb 6.7 oz (95.9 kg)    Expected Outcomes Short Term: Continue to assess and modify interventions until short term weight is achieved;Long Term: Adherence to nutrition and physical activity/exercise program aimed toward attainment of established weight goal;Weight Loss: Understanding of general recommendations for a balanced deficit meal plan, which promotes 1-2 lb weight loss per week and includes a negative energy balance of [phone] kcal/d;Weight Maintenance: Understanding of the daily nutrition guidelines, which includes 25-35% calories from fat, 7% or less cal from saturated fats, less than 200mg  cholesterol, less than 1.5gm of sodium, & 5 or more servings of fruits and vegetables daily    Hypertension Yes    Intervention Provide education on lifestyle modifcations including regular physical activity/exercise, weight management, moderate sodium restriction and increased consumption of fresh fruit,  vegetables, and low fat dairy, alcohol moderation, and smoking cessation.;Monitor prescription use compliance.    Expected Outcomes Short Term: Continued assessment and intervention until BP is < 140/61mm HG in hypertensive participants. < 130/85mm HG in hypertensive participants with diabetes, heart failure or chronic kidney disease.;Long Term: Maintenance of blood pressure at goal levels.    Lipids Yes    Intervention Provide education and support for participant on nutrition & aerobic/resistive exercise along with prescribed medications to achieve LDL 70mg , HDL >40mg .    Expected Outcomes Short Term: Participant states understanding of desired cholesterol values and is compliant with medications prescribed. Participant is following exercise prescription and nutrition guidelines.;Long Term: Cholesterol controlled with medications as prescribed, with individualized exercise RX and with personalized nutrition plan. Value goals: LDL < 70mg , HDL > 40 mg.             Core Components/Risk Factors/Patient Goals Review:    Core Components/Risk Factors/Patient Goals at Discharge (Final Review):    ITP Comments:  ITP Comments     Row Name 10/23/21 0825           ITP Comments Medical Director- Dr. Fransico Him, MD                Comments: Patient attended orientation for the cardiac rehabilitation program on 10/23/2021 to review rules and guidelines for the program. Completed 6-minute walk test, Initial ITP, and exercise prescription. Vital signs stable. Telemetry- Normal sinus rhythm/sinus bradycardia, asymptomatic. Safety measures and social distancing in place per CDC guidelines.

## 2021-10-31 ENCOUNTER — Other Ambulatory Visit: Payer: Self-pay

## 2021-10-31 ENCOUNTER — Encounter (HOSPITAL_COMMUNITY)
Admission: RE | Admit: 2021-10-31 | Discharge: 2021-10-31 | Disposition: A | Discharge status: Home / Self Care | Payer: Medicare Other | Source: Ambulatory Visit | Attending: Interventional Cardiology | Admitting: Interventional Cardiology

## 2021-10-31 DIAGNOSIS — I214 Non-ST elevation (NSTEMI) myocardial infarction: Secondary | ICD-10-CM | POA: Insufficient documentation

## 2021-10-31 DIAGNOSIS — Z951 Presence of aortocoronary bypass graft: Secondary | ICD-10-CM | POA: Insufficient documentation

## 2021-10-31 NOTE — Progress Notes (Signed)
Daily Session Note  Patient Details  Name: Kyle Strickland MRN: [id number] Date of Birth: 1946-01-15 Referring Provider:   Flowsheet Row CARDIAC REHAB PHASE II ORIENTATION from 10/23/2021 in [place]  Referring Provider Jettie Booze, MD       Encounter Date: 10/31/2021  Check In:  Session Check In - 10/31/21 1105       Check-In   Supervising physician immediately available to respond to emergencies [facility] immediately available    Physician(s) Dr. Cyndia Skeeters    Location [facility]    Staff Present Barnet Pall, RN, Deland Pretty, MS, ACSM CEP, Exercise Physiologist;Carlette Wilber Oliphant, RN, BSN;Jetta Walker BS, ACSM EP-C, Exercise Physiologist;David Juneau, MS, ACSM-CEP, CCRP, Exercise Physiologist    Virtual Visit No    Medication changes reported     No    Fall or balance concerns reported    No    Tobacco Cessation No Change    Warm-up and Cool-down Performed as group-led instruction    Resistance Training Performed No    VAD Patient? No    PAD/SET Patient? No      Pain Assessment   Currently in Pain? Other (Comment)    Pain Score 0-No pain    Multiple Pain Sites No             Capillary Blood Glucose: No results found for this or any previous visit (from the past 24 hour(s)).   Exercise Prescription Changes - 10/31/21 1033       Response to Exercise   Blood Pressure (Admit) 124/72    Blood Pressure (Exercise) 180/70    Blood Pressure (Exit) 116/75    Heart Rate (Admit) 56 bpm    Heart Rate (Exercise) 121 bpm    Heart Rate (Exit) 72 bpm    Rating of Perceived Exertion (Exercise) 13    Symptoms None    Comments Elevated BP on bike, decreased WL.    Duration Continue with 30 min of aerobic exercise without signs/symptoms of physical distress.    Intensity THRR unchanged      Progression   Progression Continue to progress workloads to maintain intensity without signs/symptoms of physical  distress.    Average METs 2.8      Resistance Training   Training Prescription No   Relaxation day, no weights   Weight --    Reps --      Interval Training   Interval Training No      Bike   Level 1.2    Minutes 15    METs 3.36      NuStep   Level 3    SPM 85    Minutes 15    METs 2.3             Social History   Tobacco Use  Smoking Status Never  Smokeless Tobacco Never    Goals Met:  Exercise tolerated well Strength training completed today  Goals Unmet:  Not Applicable  Comments: Yvone Neu started cardiac rehab today.  Pt tolerated light exercise without difficulty. VSS, telemetry-Sinus Rhythm, asymptomatic.  Medication list reconciled. Pt denies barriers to medicaiton compliance.  PSYCHOSOCIAL ASSESSMENT:  PHQ-1. Pt exhibits positive coping skills, hopeful outlook with supportive family. No psychosocial needs identified at this time, no psychosocial interventions necessary.    Pt enjoys playing golf.   Pt oriented to exercise equipment and routine.    Understanding verbalized.Barnet Pall, RN,BSN 11/01/2021 8:05 AM    Dr. Fransico Him  is Market researcher for Cardiac Rehab at [facility].

## 2021-11-01 ENCOUNTER — Other Ambulatory Visit: Payer: Self-pay | Admitting: Interventional Cardiology

## 2021-11-01 ENCOUNTER — Telehealth: Payer: Self-pay | Admitting: Interventional Cardiology

## 2021-11-01 ENCOUNTER — Other Ambulatory Visit: Payer: Self-pay | Admitting: Physician Assistant

## 2021-11-01 DIAGNOSIS — I480 Paroxysmal atrial fibrillation: Secondary | ICD-10-CM

## 2021-11-01 MED ORDER — ATORVASTATIN CALCIUM 80 MG PO TABS: 80.0000 mg | ORAL_TABLET | Freq: Every day | ORAL | 3 refills | Status: DC

## 2021-11-01 MED ORDER — APIXABAN 5 MG PO TABS: 5.0000 mg | ORAL_TABLET | Freq: Two times a day (BID) | ORAL | 1 refills | Status: DC

## 2021-11-01 MED ORDER — AMIODARONE HCL 200 MG PO TABS: 200.0000 mg | ORAL_TABLET | Freq: Every day | ORAL | 3 refills | Status: DC

## 2021-11-01 MED ORDER — METOPROLOL SUCCINATE ER 25 MG PO TB24: 25.0000 mg | ORAL_TABLET | Freq: Every day | ORAL | 3 refills | Status: DC

## 2021-11-01 NOTE — Telephone Encounter (Signed)
Eliquis 5mg  refill request received. Patient is 76 years old, weight-95.9kg, Crea-0.85 on 09/01/2021, Diagnosis-Afib, and last seen by Ermalinda Barrios on 09/26/2021. Dose is appropriate based on dosing criteria. Will send in refill to requested pharmacy.    PLEASE NOTE PT HAS REQUESTED PROVIDER SWITCH PER NOTE ON TODAY-PT WILL BE SEEING DR. Stanford Breed IN THE NEAR FUTURE AND NO LONGER DR VARANASI; IT WAS OK PER NOTE.

## 2021-11-01 NOTE — Telephone Encounter (Signed)
Pt would like to submit a Provider Switch from [place] to [place]

## 2021-11-01 NOTE — Telephone Encounter (Signed)
Pt's medications were sent to pt's pharmacy as requested. Confirmation received.  

## 2021-11-01 NOTE — Telephone Encounter (Signed)
°*  STAT* If patient is at the pharmacy, call can be transferred to refill team.   1. Which medications need to be refilled? (please list name of each medication and dose if known) metoprolol succinate (TOPROL-XL) 25 MG 24 hr tablet atorvastatin (LIPITOR) 80 MG tablet apixaban (ELIQUIS) 5 MG TABS tablet   2. Which pharmacy/location (including street and city if local pharmacy) is medication to be sent to? [id number] - [place], [place] - [address] AT [address]  3. Do they need a 30 day or 90 day supply? 90 day supply

## 2021-11-02 ENCOUNTER — Other Ambulatory Visit: Payer: Self-pay

## 2021-11-02 ENCOUNTER — Encounter (HOSPITAL_COMMUNITY)
Admission: RE | Admit: 2021-11-02 | Discharge: 2021-11-02 | Disposition: A | Discharge status: Home / Self Care | Payer: Medicare Other | Source: Ambulatory Visit | Attending: Interventional Cardiology | Admitting: Interventional Cardiology

## 2021-11-02 DIAGNOSIS — Z951 Presence of aortocoronary bypass graft: Secondary | ICD-10-CM

## 2021-11-02 DIAGNOSIS — I214 Non-ST elevation (NSTEMI) myocardial infarction: Secondary | ICD-10-CM

## 2021-11-05 ENCOUNTER — Other Ambulatory Visit: Payer: Self-pay

## 2021-11-05 ENCOUNTER — Encounter (HOSPITAL_COMMUNITY)
Admission: RE | Admit: 2021-11-05 | Discharge: 2021-11-05 | Disposition: A | Discharge status: Home / Self Care | Payer: Medicare Other | Source: Ambulatory Visit | Attending: Interventional Cardiology | Admitting: Interventional Cardiology

## 2021-11-05 DIAGNOSIS — Z951 Presence of aortocoronary bypass graft: Secondary | ICD-10-CM

## 2021-11-05 DIAGNOSIS — I214 Non-ST elevation (NSTEMI) myocardial infarction: Secondary | ICD-10-CM

## 2021-11-06 ENCOUNTER — Ambulatory Visit (INDEPENDENT_AMBULATORY_CARE_PROVIDER_SITE_OTHER): Payer: Medicare Other | Admitting: Internal Medicine

## 2021-11-06 ENCOUNTER — Encounter: Payer: Self-pay | Admitting: Internal Medicine

## 2021-11-06 VITALS — BP 120/74 | HR 59 | Temp 98.5°F | Resp 18 | Ht 71.0 in | Wt 211.5 lb

## 2021-11-06 DIAGNOSIS — Z23 Encounter for immunization: Secondary | ICD-10-CM

## 2021-11-06 DIAGNOSIS — Z0000 Encounter for general adult medical examination without abnormal findings: Secondary | ICD-10-CM

## 2021-11-06 DIAGNOSIS — R739 Hyperglycemia, unspecified: Secondary | ICD-10-CM | POA: Diagnosis not present

## 2021-11-06 DIAGNOSIS — Z8546 Personal history of malignant neoplasm of prostate: Secondary | ICD-10-CM | POA: Diagnosis not present

## 2021-11-06 DIAGNOSIS — E7849 Other hyperlipidemia: Secondary | ICD-10-CM | POA: Diagnosis not present

## 2021-11-06 LAB — COMPREHENSIVE METABOLIC PANEL
ALT: 20 U/L (ref 0–53)
AST: 16 U/L (ref 0–37)
Albumin: 4 g/dL (ref 3.5–5.2)
Alkaline Phosphatase: 87 U/L (ref 39–117)
BUN: 18 mg/dL (ref 6–23)
CO2: 28 mEq/L (ref 19–32)
Calcium: 9.7 mg/dL (ref 8.4–10.5)
Chloride: 104 mEq/L (ref 96–112)
Creatinine, Ser: 1.06 mg/dL (ref 0.40–1.50)
GFR: 68.34 mL/min (ref >60.00)
Glucose, Bld: 95 mg/dL (ref 70–99)
Potassium: 4.6 mEq/L (ref 3.5–5.1)
Sodium: 139 mEq/L (ref 135–145)
Total Bilirubin: 0.8 mg/dL (ref 0.2–1.2)
Total Protein: 6.6 g/dL (ref 6.0–8.3)

## 2021-11-06 LAB — CBC WITH DIFFERENTIAL/PLATELET
Basophils Absolute: 0 10*3/uL (ref 0.0–0.1)
Basophils Relative: 0.8 % (ref 0.0–3.0)
Eosinophils Absolute: 0.2 10*3/uL (ref 0.0–0.7)
Eosinophils Relative: 4.2 % (ref 0.0–5.0)
HCT: 40.4 % (ref 39.0–52.0)
Hemoglobin: 13 g/dL (ref 13.0–17.0)
Lymphocytes Relative: 21.9 % (ref 12.0–46.0)
Lymphs Abs: 1.1 10*3/uL (ref 0.7–4.0)
MCHC: 32.2 g/dL (ref 30.0–36.0)
MCV: 93.6 fl (ref 78.0–100.0)
Monocytes Absolute: 0.9 10*3/uL (ref 0.1–1.0)
Monocytes Relative: 18.4 % — ABNORMAL HIGH (ref 3.0–12.0)
Neutro Abs: 2.7 10*3/uL (ref 1.4–7.7)
Neutrophils Relative %: 54.7 % (ref 43.0–77.0)
Platelets: 217 10*3/uL (ref 150.0–400.0)
RBC: 4.32 Mil/uL (ref 4.22–5.81)
RDW: 15.1 % (ref 11.5–15.5)
WBC: 5 10*3/uL (ref 4.0–10.5)

## 2021-11-06 LAB — PSA: PSA: 0.01 ng/mL — ABNORMAL LOW (ref 0.10–4.00)

## 2021-11-06 LAB — HEMOGLOBIN A1C: Hgb A1c MFr Bld: 5.8 % (ref 4.6–6.5)

## 2021-11-06 NOTE — Assessment & Plan Note (Signed)
Tdap 2021 PNM 13: 2016; PNM 23: 2018 Shingrix: Discussed, recommend to proceed in a couple of months. COVID vaccine  booster recommended this week Flu shot: Today CCS: Never had a colonoscopy, negative a stool test last year.  He is just recovering from cardiac surgery, reassess next year. H/o prostate cancer: Doing well, currently follow-up by PCP with labs yearly, check a PSA. Labs: CMP CBC A1c PSA ACP information provided Doing great with diet and exercise

## 2021-11-06 NOTE — Assessment & Plan Note (Signed)
Here for CPX Elevated BP: BP looks very good. Hyperlipidemia: On Lipitor 80, cardiology plans to check FLP next month. History prostate cancer: No symptoms, check PSA. CAD, A. 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  Had postop A. fib.  Last seen by cardiology 09/26/2021, they rec to continue with apixaban, aspirin, amiodarone, metoprolol. RTC 6 months

## 2021-11-06 NOTE — Patient Instructions (Signed)
Check the  blood pressure regularly BP GOAL is between 110/65 and  135/85. If it is consistently higher or lower, let me know  Proceed with COVID-vaccine this week  Proceed with a shingles vaccination (Shingrix) in a couple of months  Proceed with your cholesterol checked at the cardiology office as recommended   GO TO THE LAB : Get the blood work     Kyle Strickland, [place] back for a checkup in 6 months

## 2021-11-06 NOTE — Progress Notes (Signed)
Subjective:    Patient ID: Kyle Strickland, male    DOB: [birth year], 76 y.o.   MRN: [id number]  DOS:  11/06/2021 Type of visit - description: cpx  Since the last office visit, had a non-STEMI and a CABG. Fortunately he is feeling great and he thinks he is recuperating very well.   Review of Systems Denies fever, cough, blood in the urine, blood in the stools.  Other than above, a 14 point review of systems is negative      Past Medical History:  Diagnosis Date   Elevated blood-pressure reading without diagnosis of hypertension    Other and unspecified hyperlipidemia    Prostate cancer ([place]) 2011   Vitamin D deficiency     Past Surgical History:  Procedure Laterality Date   CORONARY ARTERY BYPASS GRAFT N/A 08/29/2021   Procedure: CORONARY ARTERY BYPASS GRAFTING (CABG)x 4 ON CARDIOPULMONARY BYPASS. LIMA TO LAD, SVG TO OM1, SVG TO PDA-OM2 SEQ.;  Surgeon: Melrose Nakayama, MD;  Location: [place];  Service: Open Heart Surgery;  Laterality: N/A;   ENDOVEIN HARVEST OF GREATER SAPHENOUS VEIN Right 08/29/2021   Procedure: ENDOVEIN HARVEST OF GREATER SAPHENOUS VEIN;  Surgeon: Melrose Nakayama, MD;  Location: [place];  Service: Open Heart Surgery;  Laterality: Right;   LEFT HEART CATH AND CORONARY ANGIOGRAPHY N/A 08/23/2021   Procedure: LEFT HEART CATH AND CORONARY ANGIOGRAPHY;  Surgeon: Jettie Booze, MD;  Location: [place] [facility];  Service: Cardiovascular;  Laterality: N/A;   PROSTATECTOMY  2011   Dr Darcus Austin, [facility]   TEE WITHOUT CARDIOVERSION N/A 08/29/2021   Procedure: TRANSESOPHAGEAL ECHOCARDIOGRAM (TEE);  Surgeon: Melrose Nakayama, MD;  Location: [place];  Service: Open Heart Surgery;  Laterality: N/A;   Social History   Socioeconomic History   Marital status: Married    Spouse name: Not on file   Number of children: 2   Years of education: 14   Highest education level: Not on file  Occupational History   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   Tobacco Use    Smoking status: Never   Smokeless tobacco: Never  Substance and Sexual Activity   Alcohol use: Not Currently    Comment: Rarely   Drug use: No   Sexual activity: Not on file  Other Topics Concern   Not on file  Social History Narrative   Fun: Biomedical scientist, golf    Social Determinants of Health   Financial Resource Strain: Low Risk    Difficulty of Paying Living Expenses: Not hard at all  Food Insecurity: No Food Insecurity   Worried About Charity fundraiser in the Last Year: Never true   Arboriculturist in the Last Year: Never true  Transportation Needs: No Transportation Needs   Lack of Transportation (Medical): No   Lack of Transportation (Non-Medical): No  Physical Activity: Inactive   Days of Exercise per Week: 0 days   Minutes of Exercise per Session: 0 min  Stress: No Stress Concern Present   Feeling of Stress : Not at all  Social Connections: Socially Integrated   Frequency of Communication with Friends and Family: More than three times a week   Frequency of Social Gatherings with Friends and Family: More than three times a week   Attends Religious Services: More than 4 times per year   Active Member of [employer] or Organizations: Yes   Attends Archivist Meetings: More than 4 times per year   Marital Status: Married  Intimate Partner Violence: Not At Risk   Fear of Current or Ex-Partner: No   Emotionally Abused: No   Physically Abused: No   Sexually Abused: No    Current Outpatient Medications  Medication Instructions   amiodarone (PACERONE) 200 mg, Oral, Daily   apixaban (ELIQUIS) 5 mg, Oral, 2 times daily   aspirin EC 81 mg, Oral, Every M-T-W-Th-Fr, In the evening.   atorvastatin (LIPITOR) 80 mg, Oral, Daily   Magnesium 400 mg, Oral, Daily   metoprolol succinate (TOPROL-XL) 25 mg, Oral, Daily   Multiple Vitamin (MULTIVITAMINS PO) 1 tablet, Oral, Daily,     Vitamin D3 1,000 Units, Oral, Daily,         Objective:   Physical Exam BP 120/74 (BP  Location: Left Arm, Patient Position: Sitting, Cuff Size: Normal)    Pulse (!) 59    Temp 98.5 F (36.9 C) (Oral)    Resp 18    Ht 5\' 11"  (1.803 m)    Wt 211 lb 8 oz (95.9 kg)    SpO2 98%    BMI 29.50 kg/m  General: Well developed, NAD, BMI noted Neck: No  thyromegaly  HEENT:  Normocephalic . Face symmetric, atraumatic Lungs:  CTA B Normal respiratory effort, no intercostal retractions, no accessory muscle use. Heart: RRR,  no murmur.  Abdomen:  Not distended, soft, non-tender. No rebound or rigidity.   Lower extremities: no pretibial edema bilaterally  Skin: Exposed areas without rash. Not pale. Not jaundice Neurologic:  alert & oriented X3.  Speech normal, gait appropriate for age and unassisted Strength symmetric and appropriate for age.  Psych: Cognition and judgment appear intact.  Cooperative with normal attention span and concentration.  Behavior appropriate. No anxious or depressed appearing.     Assessment     ASSESSMENT Elevated BP without HTN Hyperlipidemia H/o  prostate cancer: surgery at [facility] last urology visit ~ 2015 Obesity  Vitamin D deficiency Covid infex 10-2020 CV: -CAD:   non-STEMI, status post CABG x4  11 -2- 22.   -A. fib new onset after CABG  PLAN Here for CPX Elevated BP: BP looks very good. Hyperlipidemia: On Lipitor 80, cardiology plans to check FLP next month. History prostate cancer: No symptoms, check PSA. CAD, A. 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  Had postop A. fib.  Last seen by cardiology 09/26/2021, they rec to continue with apixaban, aspirin, amiodarone, metoprolol. RTC 6 months     This visit occurred during the SARS-CoV-2 public health emergency.  Safety protocols were in place, including screening questions prior to the visit, additional usage of staff PPE, and extensive cleaning of exam room while observing appropriate contact time as indicated for disinfecting solutions.

## 2021-11-07 ENCOUNTER — Encounter (HOSPITAL_COMMUNITY)
Admission: RE | Admit: 2021-11-07 | Discharge: 2021-11-07 | Disposition: A | Discharge status: Home / Self Care | Payer: Medicare Other | Source: Ambulatory Visit | Attending: Interventional Cardiology | Admitting: Interventional Cardiology

## 2021-11-07 ENCOUNTER — Other Ambulatory Visit: Payer: Self-pay

## 2021-11-07 DIAGNOSIS — I214 Non-ST elevation (NSTEMI) myocardial infarction: Secondary | ICD-10-CM | POA: Diagnosis not present

## 2021-11-07 DIAGNOSIS — Z951 Presence of aortocoronary bypass graft: Secondary | ICD-10-CM

## 2021-11-07 NOTE — Progress Notes (Signed)
Reviewed home exercise guidelines with patient including endpoints, temperature precautions, target heart rate and rate of perceived exertion. Patient is walking 15 minutes, 2-3 days/week walking his dog as his mode of home exercise. Discussed increasing duration to 30 minutes, and patient is amenable to this. Patient voices understanding of instructions given.  Sol Passer, MS, ACSM CEP

## 2021-11-09 ENCOUNTER — Encounter (HOSPITAL_COMMUNITY)
Admission: RE | Admit: 2021-11-09 | Discharge: 2021-11-09 | Disposition: A | Discharge status: Home / Self Care | Payer: Medicare Other | Source: Ambulatory Visit | Attending: Interventional Cardiology | Admitting: Interventional Cardiology

## 2021-11-09 ENCOUNTER — Other Ambulatory Visit: Payer: Self-pay

## 2021-11-09 DIAGNOSIS — I214 Non-ST elevation (NSTEMI) myocardial infarction: Secondary | ICD-10-CM | POA: Diagnosis not present

## 2021-11-09 DIAGNOSIS — Z951 Presence of aortocoronary bypass graft: Secondary | ICD-10-CM | POA: Diagnosis not present

## 2021-11-12 ENCOUNTER — Other Ambulatory Visit: Payer: Self-pay

## 2021-11-12 ENCOUNTER — Encounter (HOSPITAL_COMMUNITY)
Admission: RE | Admit: 2021-11-12 | Discharge: 2021-11-12 | Disposition: A | Discharge status: Home / Self Care | Payer: Medicare Other | Source: Ambulatory Visit | Attending: Interventional Cardiology | Admitting: Interventional Cardiology

## 2021-11-12 DIAGNOSIS — I214 Non-ST elevation (NSTEMI) myocardial infarction: Secondary | ICD-10-CM | POA: Diagnosis not present

## 2021-11-12 DIAGNOSIS — Z951 Presence of aortocoronary bypass graft: Secondary | ICD-10-CM

## 2021-11-13 NOTE — Progress Notes (Signed)
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 In the evening., Disp: , Rfl:    atorvastatin (LIPITOR) 80 MG tablet, Take 1 tablet (80 mg total) by mouth daily., Disp: 90 tablet, Rfl: 3   Cholecalciferol (VITAMIN D3) 1000 UNITS CAPS, Take 1,000 Units by mouth daily., Disp: , Rfl:    Magnesium 400 MG CAPS, Take 400 mg by mouth daily., Disp: , Rfl:    metoprolol succinate (TOPROL-XL) 25 MG 24 hr tablet, Take 1 tablet (25 mg total) by mouth daily., Disp: 90 tablet, Rfl: 3   Multiple Vitamin (MULTIVITAMINS PO), Take 1 tablet by mouth daily., Disp: , Rfl:   Past Medical History: Past Medical History:  Diagnosis Date   Elevated blood-pressure reading without diagnosis of hypertension    Other and unspecified hyperlipidemia    Prostate cancer ([place]) 2011   Vitamin D deficiency     Tobacco Use: Social History   Tobacco Use  Smoking Status Never  Smokeless Tobacco Never    Labs: Recent Review Flowsheet Data     Labs for ITP Cardiac and Pulmonary Rehab Latest Ref Rng & Units 08/29/2021 08/29/2021 08/29/2021 08/29/2021  11/06/2021   Cholestrol 0 - 200 mg/dL - - - - -   LDLCALC 0 - 99 mg/dL - - - - -   HDL >39.00 mg/dL - - - - -   Trlycerides 0.0 - 149.0 mg/dL - - - - -   Hemoglobin A1c 4.6 - 6.5 % - - - - 5.8   PHART 7.350 - 7.450 - 7.339(L) 7.356 7.352 -   PCO2ART 32.0 - 48.0 mmHg - 44.5 40.0 39.6 -   HCO3 20.0 - 28.0 mmol/L - 24.3 22.4 22.1 -   TCO2 22 - 32 mmol/L 24 26 24 23  -   ACIDBASEDEF 0.0 - 2.0 mmol/L - 2.0 3.0(H) 3.0(H) -   O2SAT % - 99.0 98.0 98.0 -       Capillary Blood Glucose: Lab Results  Component Value Date   GLUCAP 100 (H) 09/04/2021   GLUCAP 127 (H) 09/03/2021   GLUCAP 151 (H) 09/03/2021   GLUCAP 108 (H) 09/03/2021   GLUCAP 114 (H) 09/03/2021     Exercise Target Goals: Exercise Program Goal: Individual exercise prescription set using results from initial 6 min walk test and THRR while considering  patients activity barriers and safety.   Exercise Prescription Goal: Initial exercise prescription builds to 30-45 minutes a day of aerobic activity, 2-3 days per week.  Home exercise guidelines will be given to patient during program as part of exercise prescription that the participant will  acknowledge.  Activity Barriers & Risk Stratification:  Activity Barriers & Cardiac Risk Stratification - 10/23/21 0900       Activity Barriers & Cardiac Risk Stratification   Activity Barriers Arthritis;Other (comment)    Comments Arthritis-low back    Cardiac Risk Stratification Moderate             6 Minute Walk:  6 Minute Walk     Row Name 10/23/21 0923         6 Minute Walk   Phase Initial     Distance 1693 feet     Walk Time 6 minutes     # of Rest Breaks 0     MPH 3.21     METS 3.11     RPE 11     Perceived Dyspnea  0     VO2 Peak 10.88     Symptoms No     Resting HR 58 bpm     Resting BP 140/60     Resting Oxygen Saturation  98 %     Exercise Oxygen Saturation  during 6 min walk 97 %     Max Ex. HR 91 bpm     Max Ex. BP 158/72     2 Minute Post BP  138/72              Oxygen Initial Assessment:   Oxygen Re-Evaluation:   Oxygen Discharge (Final Oxygen Re-Evaluation):   Initial Exercise Prescription:  Initial Exercise Prescription - 10/23/21 0900       Date of Initial Exercise RX and Referring Provider   Date 10/23/21    Referring Provider Jettie Booze, MD    Expected Discharge Date 12/21/21      Bike   Level 1.1    Minutes 15    METs 3.16      NuStep   Level 3    SPM 85    Minutes 15    METs 3.2      Prescription Details   Frequency (times per week) 3    Duration Progress to 30 minutes of continuous aerobic without signs/symptoms of physical distress      Intensity   THRR 40-80% of Max Heartrate 58-115    Ratings of Perceived Exertion 11-13    Perceived Dyspnea 0-4      Progression   Progression Continue to progress workloads to maintain intensity without signs/symptoms of physical distress.      Resistance Training   Training Prescription Yes    Weight 4 lbs    Reps 10-15             Perform Capillary Blood Glucose checks as needed.  Exercise Prescription Changes:   Exercise Prescription Changes     Row Name 10/31/21 1033 11/12/21 1024           Response to Exercise   Blood Pressure (Admit) 124/72 128/62      Blood Pressure (Exercise) 180/70 138/78      Blood Pressure (Exit) 116/75 102/72      Heart Rate (Admit) 56 bpm 74 bpm      Heart Rate (Exercise) 121 bpm 113 bpm      Heart Rate (Exit) 72 bpm 74 bpm      Rating of Perceived Exertion (Exercise) 13 13      Symptoms None None      Comments Elevated BP on bike, decreased WL. --      Duration Continue with 30 min of aerobic exercise without  signs/symptoms of physical distress. Continue with 30 min of aerobic exercise without signs/symptoms of physical distress.      Intensity THRR unchanged THRR unchanged        Progression   Progression Continue to progress workloads to maintain intensity without signs/symptoms of  physical distress. Continue to progress workloads to maintain intensity without signs/symptoms of physical distress.      Average METs 2.8 4.2        Resistance Training   Training Prescription No  Relaxation day, no weights Yes      Weight -- 4 lbs      Reps -- 10-15      Time -- 10 Minutes        Interval Training   Interval Training No No        Bike   Level 1.2 1.5      Minutes 15 15      METs 3.36 3.89        NuStep   Level 3 4      SPM 85 85      Minutes 15 15      METs 2.3 4.6        Home Exercise Plan   Plans to continue exercise at -- Home (comment)  Walking      Frequency -- Add 2 additional days to program exercise sessions.      Initial Home Exercises Provided -- 11/07/21               Exercise Comments:   Exercise Comments     Row Name 10/31/21 1122 11/07/21 1107         Exercise Comments Patient tolerated 1st session of exercise well without symptoms. Blood pressure elevated on stationary bike, decreased workload. Will continue to monitor. Reviewed home exercise guidelines and goals with patient.               Exercise Goals and Review:   Exercise Goals     Row Name 10/23/21 0837             Exercise Goals   Increase Physical Activity Yes       Intervention Provide advice, education, support and counseling about physical activity/exercise needs.;Develop an individualized exercise prescription for aerobic and resistive training based on initial evaluation findings, risk stratification, comorbidities and participant's personal goals.       Expected Outcomes Short Term: Attend rehab on a regular basis to increase amount of physical activity.;Long Term: Exercising regularly at least 3-5 days a week.;Long Term: Add in home exercise to make exercise part of routine and to increase amount of physical activity.       Increase Strength and Stamina Yes       Intervention Provide advice, education, support and counseling about physical  activity/exercise needs.;Develop an individualized exercise prescription for aerobic and resistive training based on initial evaluation findings, risk stratification, comorbidities and participant's personal goals.       Expected Outcomes Short Term: Increase workloads from initial exercise prescription for resistance, speed, and METs.;Short Term: Perform resistance training exercises routinely during rehab and add in resistance training at home;Long Term: Improve cardiorespiratory fitness, muscular endurance and strength as measured by increased METs and functional capacity (6MWT)       Able to understand and use rate of perceived exertion (RPE) scale Yes       Intervention Provide education and explanation on how to use RPE scale       Expected Outcomes Short Term:  Able to use RPE daily in rehab to express subjective intensity level;Long Term:  Able to use RPE to guide intensity level when exercising independently       Knowledge and understanding of Target Heart Rate Range (THRR) Yes       Intervention Provide education and explanation of THRR including how the numbers were predicted and where they are located for reference       Expected Outcomes Short Term: Able to state/look up THRR;Long Term: Able to use THRR to govern intensity when exercising independently;Short Term: Able to use daily as guideline for intensity in rehab       Able to check pulse independently Yes       Intervention Review the importance of being able to check your own pulse for safety during independent exercise;Provide education and demonstration on how to check pulse in carotid and radial arteries.       Expected Outcomes Short Term: Able to explain why pulse checking is important during independent exercise;Long Term: Able to check pulse independently and accurately       Understanding of Exercise Prescription Yes       Intervention Provide education, explanation, and written materials on patient's individual exercise  prescription       Expected Outcomes Short Term: Able to explain program exercise prescription;Long Term: Able to explain home exercise prescription to exercise independently                Exercise Goals Re-Evaluation :  Exercise Goals Re-Evaluation     [place] Name 10/31/21 1122 11/07/21 1107           Exercise Goal Re-Evaluation   Exercise Goals Review Increase Physical Activity;Able to understand and use rate of perceived exertion (RPE) scale Increase Physical Activity;Able to understand and use rate of perceived exertion (RPE) scale;Increase Strength and Stamina;Able to check pulse independently;Knowledge and understanding of Target Heart Rate Range (THRR);Understanding of Exercise Prescription      Comments Patient able to understand and use RPE scale appropriately. Reviewed exercise prescription with patient. Patient is walking 15 minutes 2-3 days/week. Discussed increasing duration to 30 minutes, and patient is amenable to this. Patient's goal is to increase leg strength and balance. Patient states he's already seen an improvement in both areas since starting cardiac rehab. Patient knows how to count his pulse.      Expected Outcomes Progress workloads as tolerated to help increase cardiorespiratory fitness. Patient will increase walking duration to 30 minutes 2-3 days/week in addition to exercise at cardiac rehab to achieve at least 150 minutes of aerobic exercise per week.               Discharge Exercise Prescription (Final Exercise Prescription Changes):  Exercise Prescription Changes - 11/12/21 1024       Response to Exercise   Blood Pressure (Admit) 128/62    Blood Pressure (Exercise) 138/78    Blood Pressure (Exit) 102/72    Heart Rate (Admit) 74 bpm    Heart Rate (Exercise) 113 bpm    Heart Rate (Exit) 74 bpm    Rating of Perceived Exertion (Exercise) 13    Symptoms None    Duration Continue with 30 min of aerobic exercise without signs/symptoms of physical  distress.    Intensity THRR unchanged      Progression   Progression Continue to progress workloads to maintain intensity without signs/symptoms of physical distress.    Average METs 4.2      Resistance Training   Training  Prescription Yes    Weight 4 lbs    Reps 10-15    Time 10 Minutes      Interval Training   Interval Training No      Bike   Level 1.5    Minutes 15    METs 3.89      NuStep   Level 4    SPM 85    Minutes 15    METs 4.6      Home Exercise Plan   Plans to continue exercise at Home (comment)   Walking   Frequency Add 2 additional days to program exercise sessions.    Initial Home Exercises Provided 11/07/21             Nutrition:  Target Goals: Understanding of nutrition guidelines, daily intake of sodium 1500mg , cholesterol 200mg , calories 30% from fat and 7% or less from saturated fats, daily to have 5 or more servings of fruits and vegetables.  Biometrics:  Pre Biometrics - 10/23/21 0825       Pre Biometrics   Waist Circumference 43.5 inches    Hip Circumference 44 inches    Waist to Hip Ratio 0.99 %    Triceps Skinfold 22 mm    % Body Fat 32.6 %    Grip Strength 36 kg    Flexibility 18.25 in    Single Leg Stand 1.37 seconds              Nutrition Therapy Plan and Nutrition Goals:   Nutrition Assessments:  MEDIFICTS Score Key: ?70 Need to make dietary changes  40-70 Heart Healthy Diet ? 40 Therapeutic Level Cholesterol Diet    Picture Your Plate Scores: <99 Unhealthy dietary pattern with much room for improvement. 41-50 Dietary pattern unlikely to meet recommendations for good health and room for improvement. 51-60 More healthful dietary pattern, with some room for improvement.  >60 Healthy dietary pattern, although there may be some specific behaviors that could be improved.    Nutrition Goals Re-Evaluation:   Nutrition Goals Re-Evaluation:   Nutrition Goals Discharge (Final Nutrition Goals  Re-Evaluation):   Psychosocial: Target Goals: Acknowledge presence or absence of significant depression and/or stress, maximize coping skills, provide positive support system. Participant is able to verbalize types and ability to use techniques and skills needed for reducing stress and depression.  Initial Review & Psychosocial Screening:  Initial Psych Review & Screening - 10/23/21 1200       Initial Review   Current issues with None Identified      Family Dynamics   Good Support System? Yes    Comments Yvone Neu has a great support system which includes his wife who is a retired Teaching laboratory technician   Psychosocial barriers to participate in program There are no identifiable barriers or psychosocial needs.      Screening Interventions   Interventions Encouraged to exercise;Provide feedback about the scores to participant    Expected Outcomes Short Term goal: Identification and review with participant of any Quality of Life or Depression concerns found by scoring the questionnaire.;Long Term goal: The participant improves quality of Life and PHQ9 Scores as seen by post scores and/or verbalization of changes             Quality of Life Scores:  Quality of Life - 10/23/21 1004       Quality of Life   Select Quality of Life      Quality of Life Scores   Health/Function Pre 24.27 %  Socioeconomic Pre 23.43 %    Psych/Spiritual Pre 24 %    Family Pre 24.7 %    GLOBAL Pre 24.1 %            Scores of 19 and below usually indicate a poorer quality of life in these areas.  A difference of  2-3 points is a clinically meaningful difference.  A difference of 2-3 points in the total score of the Quality of Life Index has been associated with significant improvement in overall quality of life, self-image, physical symptoms, and general health in studies assessing change in quality of life.  PHQ-9: Recent Review Flowsheet Data     Depression screen [facility] 2/9 11/06/2021 10/23/2021  09/14/2021 10/23/2020 12/06/2016   Decreased Interest 0 0 0 0 0   Down, Depressed, Hopeless 0 1 0 0 0   PHQ - 2 Score 0 1 0 0 0   Altered sleeping - 0 - - -   Tired, decreased energy - 1 - - -   Change in appetite - 0 - - -   Feeling bad or failure about yourself  - 0 - - -   Trouble concentrating - 0 - - -   Moving slowly or fidgety/restless - 0 - - -   Suicidal thoughts - 0 - - -   PHQ-9 Score - 2 - - -   Difficult doing work/chores - Not difficult at all - - -      Interpretation of Total Score  Total Score Depression Severity:  1-4 = Minimal depression, 5-9 = Mild depression, 10-14 = Moderate depression, 15-19 = Moderately severe depression, 20-27 = Severe depression   Psychosocial Evaluation and Intervention:   Psychosocial Re-Evaluation:  Psychosocial Re-Evaluation     Row Name 11/12/21 1715             Psychosocial Re-Evaluation   Current issues with None Identified       Interventions Encouraged to attend Cardiac Rehabilitation for the exercise       Continue Psychosocial Services  No Follow up required                Psychosocial Discharge (Final Psychosocial Re-Evaluation):  Psychosocial Re-Evaluation - 11/12/21 1715       Psychosocial Re-Evaluation   Current issues with None Identified    Interventions Encouraged to attend Cardiac Rehabilitation for the exercise    Continue Psychosocial Services  No Follow up required             Vocational Rehabilitation: Provide vocational rehab assistance to qualifying candidates.   Vocational Rehab Evaluation & Intervention:   Education: Education Goals: Education classes will be provided on a weekly basis, covering required topics. Participant will state understanding/return demonstration of topics presented.  Learning Barriers/Preferences:   Education Topics: Count Your Pulse:  -Group instruction provided by verbal instruction, demonstration, patient participation and written materials to support  subject.  Instructors address importance of being able to find your pulse and how to count your pulse when at home without a heart monitor.  Patients get hands on experience counting their pulse with staff help and individually.   Heart Attack, Angina, and Risk Factor Modification:  -Group instruction provided by verbal instruction, video, and written materials to support subject.  Instructors address signs and symptoms of angina and heart attacks.    Also discuss risk factors for heart disease and how to make changes to improve heart health risk factors.   Functional Fitness:  -Group instruction provided  by verbal instruction, demonstration, patient participation, and written materials to support subject.  Instructors address safety measures for doing things around the house.  Discuss how to get up and down off the floor, how to pick things up properly, how to safely get out of a chair without assistance, and balance training.   Meditation and Mindfulness:  -Group instruction provided by verbal instruction, patient participation, and written materials to support subject.  Instructor addresses importance of mindfulness and meditation practice to help reduce stress and improve awareness.  Instructor also leads participants through a meditation exercise.    Stretching for Flexibility and Mobility:  -Group instruction provided by verbal instruction, patient participation, and written materials to support subject.  Instructors lead participants through series of stretches that are designed to increase flexibility thus improving mobility.  These stretches are additional exercise for major muscle groups that are typically performed during regular warm up and cool down.   Hands Only CPR:  -Group verbal, video, and participation provides a basic overview of AHA guidelines for community CPR. Role-play of emergencies allow participants the opportunity to practice calling for help and chest compression  technique with discussion of AED use.   Hypertension: -Group verbal and written instruction that provides a basic overview of hypertension including the most recent diagnostic guidelines, risk factor reduction with self-care instructions and medication management.    Nutrition I class: Heart Healthy Eating:  -Group instruction provided by PowerPoint slides, verbal discussion, and written materials to support subject matter. The instructor gives an explanation and review of the Therapeutic Lifestyle Changes diet recommendations, which includes a discussion on lipid goals, dietary fat, sodium, fiber, plant stanol/sterol esters, sugar, and the components of a well-balanced, healthy diet.   Nutrition II class: Lifestyle Skills:  -Group instruction provided by PowerPoint slides, verbal discussion, and written materials to support subject matter. The instructor gives an explanation and review of label reading, grocery shopping for heart health, heart healthy recipe modifications, and ways to make healthier choices when eating out.   Diabetes Question & Answer:  -Group instruction provided by PowerPoint slides, verbal discussion, and written materials to support subject matter. The instructor gives an explanation and review of diabetes co-morbidities, pre- and post-prandial blood glucose goals, pre-exercise blood glucose goals, signs, symptoms, and treatment of hypoglycemia and hyperglycemia, and foot care basics.   Diabetes Blitz:  -Group instruction provided by PowerPoint slides, verbal discussion, and written materials to support subject matter. The instructor gives an explanation and review of the physiology behind type 1 and type 2 diabetes, diabetes medications and rational behind using different medications, pre- and post-prandial blood glucose recommendations and Hemoglobin A1c goals, diabetes diet, and exercise including blood glucose guidelines for exercising safely.    Portion Distortion:   -Group instruction provided by PowerPoint slides, verbal discussion, written materials, and food models to support subject matter. The instructor gives an explanation of serving size versus portion size, changes in portions sizes over the last 20 years, and what consists of a serving from each food group.   Stress Management:  -Group instruction provided by verbal instruction, video, and written materials to support subject matter.  Instructors review role of stress in heart disease and how to cope with stress positively.     Exercising on Your Own:  -Group instruction provided by verbal instruction, power point, and written materials to support subject.  Instructors discuss benefits of exercise, components of exercise, frequency and intensity of exercise, and end points for exercise.  Also discuss use  of nitroglycerin and activating EMS.  Review options of places to exercise outside of rehab.  Review guidelines for sex with heart disease.   Cardiac Drugs I:  -Group instruction provided by verbal instruction and written materials to support subject.  Instructor reviews cardiac drug classes: antiplatelets, anticoagulants, beta blockers, and statins.  Instructor discusses reasons, side effects, and lifestyle considerations for each drug class.   Cardiac Drugs II:  -Group instruction provided by verbal instruction and written materials to support subject.  Instructor reviews cardiac drug classes: angiotensin converting enzyme inhibitors (ACE-I), angiotensin II receptor blockers (ARBs), nitrates, and calcium channel blockers.  Instructor discusses reasons, side effects, and lifestyle considerations for each drug class.   Anatomy and Physiology of the Circulatory System:  Group verbal and written instruction and models provide basic cardiac anatomy and physiology, with the coronary electrical and arterial systems. Review of: AMI, Angina, Valve disease, Heart Failure, Peripheral Artery Disease,  Cardiac Arrhythmia, Pacemakers, and the ICD.   Other Education:  -Group or individual verbal, written, or video instructions that support the educational goals of the cardiac rehab program.   Holiday Eating Survival Tips:  -Group instruction provided by PowerPoint slides, verbal discussion, and written materials to support subject matter. The instructor gives patients tips, tricks, and techniques to help them not only survive but enjoy the holidays despite the onslaught of food that accompanies the holidays.   Knowledge Questionnaire Score:  Knowledge Questionnaire Score - 10/23/21 1004       Knowledge Questionnaire Score   Pre Score 19/24             Core Components/Risk Factors/Patient Goals at Admission:  Personal Goals and Risk Factors at Admission - 10/23/21 0836       Core Components/Risk Factors/Patient Goals on Admission    Weight Management Yes;Obesity;Weight Maintenance;Weight Loss    Intervention Weight Management/Obesity: Establish reasonable short term and long term weight goals.;Obesity: Provide education and appropriate resources to help participant work on and attain dietary goals.;Weight Management: Provide education and appropriate resources to help participant work on and attain dietary goals.    Admit Weight 211 lb 6.7 oz (95.9 kg)    Expected Outcomes Short Term: Continue to assess and modify interventions until short term weight is achieved;Long Term: Adherence to nutrition and physical activity/exercise program aimed toward attainment of established weight goal;Weight Loss: Understanding of general recommendations for a balanced deficit meal plan, which promotes 1-2 lb weight loss per week and includes a negative energy balance of [phone] kcal/d;Weight Maintenance: Understanding of the daily nutrition guidelines, which includes 25-35% calories from fat, 7% or less cal from saturated fats, less than 200mg  cholesterol, less than 1.5gm of sodium, & 5 or more  servings of fruits and vegetables daily    Hypertension Yes    Intervention Provide education on lifestyle modifcations including regular physical activity/exercise, weight management, moderate sodium restriction and increased consumption of fresh fruit, vegetables, and low fat dairy, alcohol moderation, and smoking cessation.;Monitor prescription use compliance.    Expected Outcomes Short Term: Continued assessment and intervention until BP is < 140/45mm HG in hypertensive participants. < 130/31mm HG in hypertensive participants with diabetes, heart failure or chronic kidney disease.;Long Term: Maintenance of blood pressure at goal levels.    Lipids Yes    Intervention Provide education and support for participant on nutrition & aerobic/resistive exercise along with prescribed medications to achieve LDL 70mg , HDL >40mg .    Expected Outcomes Short Term: Participant states understanding of desired cholesterol values and is  compliant with medications prescribed. Participant is following exercise prescription and nutrition guidelines.;Long Term: Cholesterol controlled with medications as prescribed, with individualized exercise RX and with personalized nutrition plan. Value goals: LDL < 70mg , HDL > 40 mg.             Core Components/Risk Factors/Patient Goals Review:   Goals and Risk Factor Review     Row Name 11/12/21 1716             Core Components/Risk Factors/Patient Goals Review   Personal Goals Review Weight Management/Obesity;Hypertension;Lipids       Review Yvone Neu is off to a good start to exercise at phase 2 cardiac rehab. Vital signs have been stable.       Expected Outcomes Yvone Neu will continue to participate in phase 2 cardiac rehab for exercise, nutrition and lifestyle modifications                Core Components/Risk Factors/Patient Goals at Discharge (Final Review):   Goals and Risk Factor Review - 11/12/21 1716       Core Components/Risk Factors/Patient Goals Review    Personal Goals Review Weight Management/Obesity;Hypertension;Lipids    Review Yvone Neu is off to a good start to exercise at phase 2 cardiac rehab. Vital signs have been stable.    Expected Outcomes Yvone Neu will continue to participate in phase 2 cardiac rehab for exercise, nutrition and lifestyle modifications             ITP Comments:  ITP Comments     Row Name 10/23/21 0825 11/12/21 1714         ITP Comments Medical Director- Dr. Fransico Him, MD 30 Day ITP Review. Yvone Neu has good attendance and participation in phase 2 cardiac rehab. Yvone Neu is doing well with exercise.               Comments: See ITP Comments

## 2021-11-14 ENCOUNTER — Other Ambulatory Visit: Payer: Self-pay

## 2021-11-14 ENCOUNTER — Encounter (HOSPITAL_COMMUNITY)
Admission: RE | Admit: 2021-11-14 | Discharge: 2021-11-14 | Disposition: A | Discharge status: Home / Self Care | Payer: Medicare Other | Source: Ambulatory Visit | Attending: Interventional Cardiology | Admitting: Interventional Cardiology

## 2021-11-14 DIAGNOSIS — I214 Non-ST elevation (NSTEMI) myocardial infarction: Secondary | ICD-10-CM | POA: Diagnosis not present

## 2021-11-14 DIAGNOSIS — Z951 Presence of aortocoronary bypass graft: Secondary | ICD-10-CM

## 2021-11-16 ENCOUNTER — Other Ambulatory Visit: Payer: Self-pay

## 2021-11-16 ENCOUNTER — Encounter (HOSPITAL_COMMUNITY)
Admission: RE | Admit: 2021-11-16 | Discharge: 2021-11-16 | Disposition: A | Discharge status: Home / Self Care | Payer: Medicare Other | Source: Ambulatory Visit | Attending: Interventional Cardiology | Admitting: Interventional Cardiology

## 2021-11-16 DIAGNOSIS — I214 Non-ST elevation (NSTEMI) myocardial infarction: Secondary | ICD-10-CM | POA: Diagnosis not present

## 2021-11-16 DIAGNOSIS — Z951 Presence of aortocoronary bypass graft: Secondary | ICD-10-CM | POA: Diagnosis not present

## 2021-11-19 ENCOUNTER — Other Ambulatory Visit: Payer: Self-pay

## 2021-11-19 ENCOUNTER — Encounter (HOSPITAL_COMMUNITY)
Admission: RE | Admit: 2021-11-19 | Discharge: 2021-11-19 | Disposition: A | Discharge status: Home / Self Care | Payer: Medicare Other | Source: Ambulatory Visit | Attending: Interventional Cardiology | Admitting: Interventional Cardiology

## 2021-11-19 DIAGNOSIS — Z951 Presence of aortocoronary bypass graft: Secondary | ICD-10-CM

## 2021-11-19 DIAGNOSIS — I214 Non-ST elevation (NSTEMI) myocardial infarction: Secondary | ICD-10-CM

## 2021-11-21 ENCOUNTER — Encounter (HOSPITAL_COMMUNITY)
Admission: RE | Admit: 2021-11-21 | Discharge: 2021-11-21 | Disposition: A | Discharge status: Home / Self Care | Payer: Medicare Other | Source: Ambulatory Visit | Attending: Interventional Cardiology | Admitting: Interventional Cardiology

## 2021-11-21 ENCOUNTER — Other Ambulatory Visit: Payer: Self-pay

## 2021-11-21 DIAGNOSIS — Z951 Presence of aortocoronary bypass graft: Secondary | ICD-10-CM

## 2021-11-21 DIAGNOSIS — I214 Non-ST elevation (NSTEMI) myocardial infarction: Secondary | ICD-10-CM

## 2021-11-23 ENCOUNTER — Encounter (HOSPITAL_COMMUNITY)
Admission: RE | Admit: 2021-11-23 | Discharge: 2021-11-23 | Disposition: A | Discharge status: Home / Self Care | Payer: Medicare Other | Source: Ambulatory Visit | Attending: Interventional Cardiology | Admitting: Interventional Cardiology

## 2021-11-23 ENCOUNTER — Other Ambulatory Visit: Payer: Self-pay

## 2021-11-23 DIAGNOSIS — I214 Non-ST elevation (NSTEMI) myocardial infarction: Secondary | ICD-10-CM

## 2021-11-23 DIAGNOSIS — Z951 Presence of aortocoronary bypass graft: Secondary | ICD-10-CM | POA: Diagnosis not present

## 2021-11-25 NOTE — Progress Notes (Signed)
Cardiology Office Note   Date:  11/26/2021   ID:  Kyle Strickland, Nez September 26, 1946, MRN [id number]  PCP:  Colon Branch, MD    Chief Complaint  Patient presents with   Follow-up   CAD  Wt Readings from Last 3 Encounters:  11/26/21 211 lb (95.7 kg)  11/06/21 211 lb 8 oz (95.9 kg)  10/23/21 211 lb 6.7 oz (95.9 kg)       History of Present Illness: Kyle Strickland is a 76 y.o. male  with CAD, s/p CABG in 08/2021 Angina was a feeling of left arm pressure during activity/construction and walking.  Occurred at rest- most severe episode.  Resolved after 10 minutes.  The next day was worse with walking the dog.  Presented with NSTEMI in 07/2021.  Cath sohwed: "Mid RCA lesion is 100% stenosed.   Mid LAD lesion is 95% stenosed.   Prox LAD to Mid LAD lesion is 75% stenosed.   1st Diag lesion is 70% stenosed.   Prox Cx lesion is 90% stenosed.   1st Mrg lesion is 75% stenosed.   The left ventricular systolic function is normal.   LV end diastolic pressure is normal.   The left ventricular ejection fraction is 55-65% by visual estimate.   There is no aortic valve stenosis.   Severe, complex, multivessel CAD.   Echo in 2022: "Left ventricular ejection fraction, by estimation, is 65 to 70%. The  left ventricle has normal function. The left ventricle has no regional  wall motion abnormalities. Left ventricular diastolic parameters were  normal.   2. Right ventricular systolic function is normal. The right ventricular  size is mildly enlarged.   3. The mitral valve is grossly normal. No evidence of mitral valve  regurgitation.   4. The aortic valve is grossly normal. There is mild calcification of the  aortic valve. Aortic valve regurgitation is mild. "   Plan for cardiac surgery consult.     Start high potency statin.  Add low dose beta blocker. Restart IV heparin 8 hours post sheath pull.  "  Denies : Chest pain. Dizziness. Leg edema. Nitroglycerin use. Orthopnea. Palpitations.  Paroxysmal nocturnal dyspnea. Shortness of breath. Syncope.    Cardiac Rehab 3 days a week- no sx.   Some sensitivity at the skin incision.   Past Medical History:  Diagnosis Date   Elevated blood-pressure reading without diagnosis of hypertension    Other and unspecified hyperlipidemia    Prostate cancer ([place]) 2011   Vitamin D deficiency     Past Surgical History:  Procedure Laterality Date   CORONARY ARTERY BYPASS GRAFT N/A 08/29/2021   Procedure: CORONARY ARTERY BYPASS GRAFTING (CABG)x 4 ON CARDIOPULMONARY BYPASS. LIMA TO LAD, SVG TO OM1, SVG TO PDA-OM2 SEQ.;  Surgeon: Melrose Nakayama, MD;  Location: [place];  Service: Open Heart Surgery;  Laterality: N/A;   ENDOVEIN HARVEST OF GREATER SAPHENOUS VEIN Right 08/29/2021   Procedure: ENDOVEIN HARVEST OF GREATER SAPHENOUS VEIN;  Surgeon: Melrose Nakayama, MD;  Location: [place];  Service: Open Heart Surgery;  Laterality: Right;   LEFT HEART CATH AND CORONARY ANGIOGRAPHY N/A 08/23/2021   Procedure: LEFT HEART CATH AND CORONARY ANGIOGRAPHY;  Surgeon: Jettie Booze, MD;  Location: [place] [facility];  Service: Cardiovascular;  Laterality: N/A;   PROSTATECTOMY  2011   Dr Darcus Austin, [facility]   TEE WITHOUT CARDIOVERSION N/A 08/29/2021   Procedure: TRANSESOPHAGEAL ECHOCARDIOGRAM (TEE);  Surgeon: Melrose Nakayama, MD;  Location: [place];  Service: Open Heart Surgery;  Laterality: N/A;     Current Outpatient Medications  Medication Sig Dispense Refill   apixaban (ELIQUIS) 5 MG TABS tablet Take 1 tablet (5 mg total) by mouth 2 (two) times daily. 180 tablet 1   aspirin EC 81 MG tablet Take 81 mg by mouth every Monday, Tuesday, Wednesday, Thursday, and Friday. In the evening.     atorvastatin (LIPITOR) 80 MG tablet Take 1 tablet (80 mg total) by mouth daily. 90 tablet 3   Cholecalciferol (VITAMIN D3) 1000 UNITS CAPS Take 1,000 Units by mouth daily.     Magnesium 400 MG CAPS Take 400 mg by mouth daily.     metoprolol succinate (TOPROL-XL)  25 MG 24 hr tablet Take 1 tablet (25 mg total) by mouth daily. 90 tablet 3   Multiple Vitamin (MULTIVITAMINS PO) Take 1 tablet by mouth daily.     No current facility-administered medications for this visit.    Allergies:   Ciprofloxacin, Ditropan [oxybutynin chloride], and Codeine    Social History:  The patient  reports that he has never smoked. He has never used smokeless tobacco. He reports that he does not currently use alcohol. He reports that he does not use drugs.   Family History:  The patient's family history includes Cancer in his maternal grandfather; Dementia in his father; Diabetes in his father and paternal grandfather; Heart attack (age of onset: 34) in his mother; Stroke in his paternal grandfather; Transient ischemic attack in his paternal uncle.    ROS:  Please see the history of present illness.   Otherwise, review of systems are positive for sensitivity of chest wall incision site.   All other systems are reviewed and negative.    PHYSICAL EXAM: VS:  BP 126/62    Pulse (!) 55    Ht 5\' 11"  (1.803 m)    Wt 211 lb (95.7 kg)    SpO2 99%    BMI 29.43 kg/m  , BMI Body mass index is 29.43 kg/m. GEN: Well nourished, well developed, in no acute distress HEENT: normal Neck: no JVD, carotid bruits, or masses Cardiac: RRR; no murmurs, rubs, or gallops,no edema  Respiratory:  clear to auscultation bilaterally, normal work of breathing GI: soft, nontender, nondistended, + BS MS: no deformity or atrophy Skin: warm and dry, no rash Neuro:  Strength and sensation are intact Psych: euthymic mood, full affect    Recent Labs: 09/04/2021: Magnesium 2.4 11/06/2021: ALT 20; BUN 18; Creatinine, Ser 1.06; Hemoglobin 13.0; Platelets 217.0; Potassium 4.6; Sodium 139   Lipid Panel    Component Value Date/Time   CHOL 193 07/10/2021 0949   CHOL 214 (H) 10/01/2011 1400   TRIG 115.0 07/10/2021 0949   TRIG 131 10/01/2011 1400   HDL 42.70 07/10/2021 0949   HDL 52 10/01/2011 1400    CHOLHDL 5 07/10/2021 0949   VLDL 23.0 07/10/2021 0949   LDLCALC 127 (H) 07/10/2021 0949   LDLCALC 136 (H) 10/01/2011 1400     Other studies Reviewed: Additional studies/ records that were reviewed today with results demonstrating: .   ASSESSMENT AND PLAN:  CAD: s/p CABG.  Old MI.  Exercising 30 minutes a day 5 days a week.  Decrease salt intake.  No angina PAF: Occurred after surgery.  Sx were mild. HR was in the 140s.  Eliquis for stroke prevention and amiodarone to maintain abnormal heart rhythm.  Stop the amiodarone and see if any palpitations return.  2 weeks Zio patch in about 4 weeks  to make sure that no AFib.  If no atrial fibrillation on the monitor then could stop Eliquis. Hyperlipidemia: Whole food, plant-based diet.  Eating healthy.  HTN: Avoid Processed food. BP readings at rehab are well controlled.    Current medicines are reviewed at length with the patient today.  The patient concerns regarding his medicines were addressed.  The following changes have been made: Stop amiodarone  Labs/ tests ordered today include: Plan for 2 weeks Zio patch at the beginning of March, once amiodarone is out of the system  Orders Placed This Encounter  Procedures   LONG TERM MONITOR (3-14 DAYS)   EKG 12-Lead    Recommend 150 minutes/week of aerobic exercise Low fat, low carb, high fiber diet recommended  Disposition:   FU in 07/2022   Signed, Larae Grooms, MD  11/26/2021 10:56 AM    [facility] [address], [place], [place]  [address] Phone: [phone]; Fax: [phone]

## 2021-11-26 ENCOUNTER — Ambulatory Visit (INDEPENDENT_AMBULATORY_CARE_PROVIDER_SITE_OTHER): Payer: Medicare Other

## 2021-11-26 ENCOUNTER — Ambulatory Visit: Payer: Medicare Other | Admitting: Interventional Cardiology

## 2021-11-26 ENCOUNTER — Other Ambulatory Visit: Payer: Self-pay

## 2021-11-26 ENCOUNTER — Other Ambulatory Visit: Payer: Medicare Other | Admitting: *Deleted

## 2021-11-26 ENCOUNTER — Encounter: Payer: Self-pay | Admitting: Interventional Cardiology

## 2021-11-26 ENCOUNTER — Encounter (HOSPITAL_COMMUNITY): Payer: Medicare Other

## 2021-11-26 VITALS — BP 126/62 | HR 55 | Ht 71.0 in | Wt 211.0 lb

## 2021-11-26 DIAGNOSIS — I10 Essential (primary) hypertension: Secondary | ICD-10-CM | POA: Diagnosis not present

## 2021-11-26 DIAGNOSIS — E782 Mixed hyperlipidemia: Secondary | ICD-10-CM

## 2021-11-26 DIAGNOSIS — I25118 Atherosclerotic heart disease of native coronary artery with other forms of angina pectoris: Secondary | ICD-10-CM

## 2021-11-26 DIAGNOSIS — I252 Old myocardial infarction: Secondary | ICD-10-CM | POA: Diagnosis not present

## 2021-11-26 DIAGNOSIS — I480 Paroxysmal atrial fibrillation: Secondary | ICD-10-CM | POA: Diagnosis not present

## 2021-11-26 DIAGNOSIS — E7849 Other hyperlipidemia: Secondary | ICD-10-CM

## 2021-11-26 LAB — LIPID PANEL
Chol/HDL Ratio: 2.2 ratio (ref 0.0–5.0)
Cholesterol, Total: 108 mg/dL (ref 100–199)
HDL: 49 mg/dL (ref >39)
LDL Chol Calc (NIH): 38 mg/dL (ref 0–99)
Triglycerides: 114 mg/dL (ref 0–149)
VLDL Cholesterol Cal: 21 mg/dL (ref 5–40)

## 2021-11-26 NOTE — Progress Notes (Unsigned)
Enrolled for [employer] to mail a ZIO XT long term holter monitor to the patients address on file. Patient to wear end of February, beginning of March.  Anticipated delivery 12/18/21.

## 2021-11-26 NOTE — Patient Instructions (Addendum)
Medication Instructions:  Your physician has recommended you make the following change in your medication: Stop amiodarone  *If you need a refill on your cardiac medications before your next appointment, please call your pharmacy*   Lab Work: Lab work to be done today--Lipid profile If you have labs (blood work) drawn today and your tests are completely normal, you will receive your results only by: [place] (if you have MyChart) OR A paper copy in the mail If you have any lab test that is abnormal or we need to change your treatment, we will call you to review the results.   Testing/Procedures: Your physician has recommended that you wear a holter monitor. Holter monitors are medical devices that record the hearts electrical activity. Doctors most often use these monitors to diagnose arrhythmias. Arrhythmias are problems with the speed or rhythm of the heartbeat. The monitor is a small, portable device. You can wear one while you do your normal daily activities. This is usually used to diagnose what is causing palpitations/syncope (passing out). 2 week zio.  To be applied late February or early March   Follow-Up: At [facility], you and your health needs are our priority.  As part of our continuing mission to provide you with exceptional heart care, we have created designated Provider Care Teams.  These Care Teams include your primary Cardiologist (physician) and Advanced Practice Providers (APPs -  Physician Assistants and Nurse Practitioners) who all work together to provide you with the care you need, when you need it.  We recommend signing up for the patient portal called "MyChart".  Sign up information is provided on this After Visit Summary.  MyChart is used to connect with patients for Virtual Visits (Telemedicine).  Patients are able to view lab/test results, encounter notes, upcoming appointments, etc.  Non-urgent messages can be sent to your provider as well.   To learn  more about what you can do with MyChart, go to [email or web address].    Your next appointment:   9 month(s)--October or November  The format for your next appointment:   In Person  Provider:   Larae Grooms, MD S    Other Instructions  [place] Monitor Instructions  Your physician has requested you wear a ZIO patch monitor for 14 days.  This is a single patch monitor. [employer] supplies one patch monitor per enrollment. Additional stickers are not available. Please do not apply patch if you will be having a Nuclear Stress Test,  Echocardiogram, Cardiac CT, MRI, or Chest Xray during the period you would be wearing the  monitor. The patch cannot be worn during these tests. You cannot remove and re-apply the  ZIO XT patch monitor.  Your ZIO patch monitor will be mailed 3 day USPS to your address on file. It may take 3-5 days  to receive your monitor after you have been enrolled.  Once you have received your monitor, please review the enclosed instructions. Your monitor  has already been registered assigning a specific monitor serial # to you.  Billing and Patient Assistance Program Information  We have supplied [employer] with any of your insurance information on file for billing purposes. [employer] offers a sliding scale Patient Assistance Program for patients that do not have  insurance, or whose insurance does not completely cover the cost of the ZIO monitor.  You must apply for the Patient Assistance Program to qualify for this discounted rate.  To apply, please call [employer] at [phone], select option 4, select  option 2, ask to apply for  Patient Assistance Program. Theodore Demark will ask your household income, and how many people  are in your household. They will quote your out-of-pocket cost based on that information.  [employer] will also be able to set up a 36-month, interest-free payment plan if needed.  Applying the monitor   Shave hair from upper left chest.  Hold  abrader disc by orange tab. Rub abrader in 40 strokes over the upper left chest as  indicated in your monitor instructions.  Clean area with 4 enclosed alcohol pads. Let dry.  Apply patch as indicated in monitor instructions. Patch will be placed under collarbone on left  side of chest with arrow pointing upward.  Rub patch adhesive wings for 2 minutes. Remove white label marked "1". Remove the white  label marked "2". Rub patch adhesive wings for 2 additional minutes.  While looking in a mirror, press and release button in center of patch. A small green light will  flash 3-4 times. This will be your only indicator that the monitor has been turned on.  Do not shower for the first 24 hours. You may shower after the first 24 hours.  Press the button if you feel a symptom. You will hear a small click. Record Date, Time and  Symptom in the Patient Logbook.  When you are ready to remove the patch, follow instructions on the last 2 pages of Patient  Logbook. Stick patch monitor onto the last page of Patient Logbook.  Place Patient Logbook in the blue and white box. Use locking tab on box and tape box closed  securely. The blue and white box has prepaid postage on it. Please place it in the mailbox as  soon as possible. Your physician should have your test results approximately 7 days after the  monitor has been mailed back to [facility].  Call [facility] at [phone] if you have questions regarding  your ZIO XT patch monitor. Call them immediately if you see an orange light blinking on your  monitor.  If your monitor falls off in less than 4 days, contact our Monitor department at [phone].  If your monitor becomes loose or falls off after 4 days call [employer] at [phone] for  suggestions on securing your monitor

## 2021-11-28 ENCOUNTER — Encounter (HOSPITAL_COMMUNITY)
Admission: RE | Admit: 2021-11-28 | Discharge: 2021-11-28 | Disposition: A | Discharge status: Home / Self Care | Payer: Medicare Other | Source: Ambulatory Visit | Attending: Interventional Cardiology | Admitting: Interventional Cardiology

## 2021-11-28 ENCOUNTER — Other Ambulatory Visit: Payer: Self-pay

## 2021-11-28 DIAGNOSIS — Z951 Presence of aortocoronary bypass graft: Secondary | ICD-10-CM | POA: Insufficient documentation

## 2021-11-28 DIAGNOSIS — I214 Non-ST elevation (NSTEMI) myocardial infarction: Secondary | ICD-10-CM

## 2021-11-28 DIAGNOSIS — I252 Old myocardial infarction: Secondary | ICD-10-CM | POA: Diagnosis not present

## 2021-11-28 DIAGNOSIS — Z5189 Encounter for other specified aftercare: Secondary | ICD-10-CM | POA: Insufficient documentation

## 2021-11-30 ENCOUNTER — Encounter (HOSPITAL_COMMUNITY)
Admission: RE | Admit: 2021-11-30 | Discharge: 2021-11-30 | Disposition: A | Discharge status: Home / Self Care | Payer: Medicare Other | Source: Ambulatory Visit | Attending: Interventional Cardiology | Admitting: Interventional Cardiology

## 2021-11-30 ENCOUNTER — Other Ambulatory Visit: Payer: Self-pay

## 2021-11-30 DIAGNOSIS — Z951 Presence of aortocoronary bypass graft: Secondary | ICD-10-CM

## 2021-11-30 DIAGNOSIS — I214 Non-ST elevation (NSTEMI) myocardial infarction: Secondary | ICD-10-CM

## 2021-11-30 DIAGNOSIS — I252 Old myocardial infarction: Secondary | ICD-10-CM | POA: Diagnosis not present

## 2021-11-30 DIAGNOSIS — Z5189 Encounter for other specified aftercare: Secondary | ICD-10-CM | POA: Diagnosis not present

## 2021-11-30 NOTE — Progress Notes (Signed)
Sinus brady rate 49 noted this morning at cardiac rehab nonsustained. Patient asymptomatic. Will fax today's ECG tracing  to Dr. Hassell Done office for review.Harrell Gave RN BSN

## 2021-12-02 ENCOUNTER — Other Ambulatory Visit: Payer: Self-pay | Admitting: Physician Assistant

## 2021-12-02 DIAGNOSIS — I480 Paroxysmal atrial fibrillation: Secondary | ICD-10-CM

## 2021-12-03 ENCOUNTER — Encounter (HOSPITAL_COMMUNITY)
Admission: RE | Admit: 2021-12-03 | Discharge: 2021-12-03 | Disposition: A | Discharge status: Home / Self Care | Payer: Medicare Other | Source: Ambulatory Visit | Attending: Interventional Cardiology | Admitting: Interventional Cardiology

## 2021-12-03 ENCOUNTER — Other Ambulatory Visit: Payer: Self-pay

## 2021-12-03 DIAGNOSIS — Z5189 Encounter for other specified aftercare: Secondary | ICD-10-CM | POA: Diagnosis not present

## 2021-12-03 DIAGNOSIS — Z951 Presence of aortocoronary bypass graft: Secondary | ICD-10-CM | POA: Diagnosis not present

## 2021-12-03 DIAGNOSIS — I214 Non-ST elevation (NSTEMI) myocardial infarction: Secondary | ICD-10-CM

## 2021-12-03 DIAGNOSIS — I252 Old myocardial infarction: Secondary | ICD-10-CM | POA: Diagnosis not present

## 2021-12-05 ENCOUNTER — Other Ambulatory Visit: Payer: Self-pay

## 2021-12-05 ENCOUNTER — Encounter (HOSPITAL_COMMUNITY)
Admission: RE | Admit: 2021-12-05 | Discharge: 2021-12-05 | Disposition: A | Discharge status: Home / Self Care | Payer: Medicare Other | Source: Ambulatory Visit | Attending: Interventional Cardiology | Admitting: Interventional Cardiology

## 2021-12-05 DIAGNOSIS — Z951 Presence of aortocoronary bypass graft: Secondary | ICD-10-CM

## 2021-12-05 DIAGNOSIS — I214 Non-ST elevation (NSTEMI) myocardial infarction: Secondary | ICD-10-CM

## 2021-12-05 NOTE — Progress Notes (Signed)
Incomplete Session Note  Patient Details  Name: Kyle Strickland MRN: [id number] Date of Birth: 1946-08-20 Referring Provider:   Flowsheet Row CARDIAC REHAB PHASE II ORIENTATION from 10/23/2021 in [place]  Referring Provider Jettie Booze, MD       Kyle Strickland did not complete his rehab session.  Kyle Strickland came in with cold symptoms. Kyle Strickland said that he took a home COVID 19 test that was negative. I advised Kyle Strickland not to exercise today if he is not feeling well. Kyle Strickland plans to return to exercise when he is feeling better.Harrell Gave RN BSN

## 2021-12-07 ENCOUNTER — Encounter (HOSPITAL_COMMUNITY)
Admission: RE | Admit: 2021-12-07 | Discharge: 2021-12-07 | Disposition: A | Discharge status: Home / Self Care | Payer: Medicare Other | Source: Ambulatory Visit | Attending: Interventional Cardiology | Admitting: Interventional Cardiology

## 2021-12-07 ENCOUNTER — Other Ambulatory Visit: Payer: Self-pay

## 2021-12-07 DIAGNOSIS — I252 Old myocardial infarction: Secondary | ICD-10-CM | POA: Diagnosis not present

## 2021-12-07 DIAGNOSIS — Z951 Presence of aortocoronary bypass graft: Secondary | ICD-10-CM

## 2021-12-07 DIAGNOSIS — I214 Non-ST elevation (NSTEMI) myocardial infarction: Secondary | ICD-10-CM

## 2021-12-07 DIAGNOSIS — Z5189 Encounter for other specified aftercare: Secondary | ICD-10-CM | POA: Diagnosis not present

## 2021-12-10 ENCOUNTER — Other Ambulatory Visit: Payer: Self-pay

## 2021-12-10 ENCOUNTER — Encounter (HOSPITAL_COMMUNITY)
Admission: RE | Admit: 2021-12-10 | Discharge: 2021-12-10 | Disposition: A | Discharge status: Home / Self Care | Payer: Medicare Other | Source: Ambulatory Visit | Attending: Interventional Cardiology | Admitting: Interventional Cardiology

## 2021-12-10 DIAGNOSIS — I252 Old myocardial infarction: Secondary | ICD-10-CM | POA: Diagnosis not present

## 2021-12-10 DIAGNOSIS — I214 Non-ST elevation (NSTEMI) myocardial infarction: Secondary | ICD-10-CM

## 2021-12-10 DIAGNOSIS — Z951 Presence of aortocoronary bypass graft: Secondary | ICD-10-CM

## 2021-12-10 DIAGNOSIS — Z5189 Encounter for other specified aftercare: Secondary | ICD-10-CM | POA: Diagnosis not present

## 2021-12-12 ENCOUNTER — Encounter (HOSPITAL_COMMUNITY)
Admission: RE | Admit: 2021-12-12 | Discharge: 2021-12-12 | Disposition: A | Discharge status: Home / Self Care | Payer: Medicare Other | Source: Ambulatory Visit | Attending: Interventional Cardiology | Admitting: Interventional Cardiology

## 2021-12-12 ENCOUNTER — Other Ambulatory Visit: Payer: Self-pay

## 2021-12-12 DIAGNOSIS — Z951 Presence of aortocoronary bypass graft: Secondary | ICD-10-CM | POA: Diagnosis not present

## 2021-12-12 DIAGNOSIS — I252 Old myocardial infarction: Secondary | ICD-10-CM | POA: Diagnosis not present

## 2021-12-12 DIAGNOSIS — I214 Non-ST elevation (NSTEMI) myocardial infarction: Secondary | ICD-10-CM

## 2021-12-12 DIAGNOSIS — Z5189 Encounter for other specified aftercare: Secondary | ICD-10-CM | POA: Diagnosis not present

## 2021-12-12 NOTE — Progress Notes (Signed)
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 In the evening., Disp: , Rfl:    atorvastatin (LIPITOR) 80 MG tablet, Take 1 tablet (80 mg total) by mouth daily., Disp: 90 tablet, Rfl: 3   Cholecalciferol (VITAMIN D3) 1000 UNITS CAPS, Take 1,000 Units by mouth daily., Disp: , Rfl:    Magnesium 400 MG CAPS, Take 400 mg by mouth daily., Disp: , Rfl:    metoprolol succinate (TOPROL-XL) 25 MG 24 hr tablet, Take 1 tablet (25 mg total) by mouth daily., Disp: 90 tablet, Rfl: 3   Multiple Vitamin (MULTIVITAMINS PO), Take 1 tablet by mouth daily., Disp: , Rfl:   Past Medical History: Past Medical History:  Diagnosis Date   Elevated blood-pressure reading without diagnosis of hypertension    Other and unspecified hyperlipidemia    Prostate cancer ([place]) 2011   Vitamin D deficiency     Tobacco Use: Social History   Tobacco Use  Smoking Status Never  Smokeless Tobacco Never    Labs: Recent Review Flowsheet Data     Labs for ITP Cardiac and Pulmonary Rehab Latest Ref Rng & Units 08/29/2021 08/29/2021 08/29/2021 11/06/2021 11/26/2021   Cholestrol 100 - 199 mg/dL - - - - 108   LDLCALC 0 - 99 mg/dL - - - - 38   HDL >39 mg/dL - - - - 49    Trlycerides 0 - 149 mg/dL - - - - 114   Hemoglobin A1c 4.6 - 6.5 % - - - 5.8 -   PHART 7.350 - 7.450 7.339(L) 7.356 7.352 - -   PCO2ART 32.0 - 48.0 mmHg 44.5 40.0 39.6 - -   HCO3 20.0 - 28.0 mmol/L 24.3 22.4 22.1 - -   TCO2 22 - 32 mmol/L 26 24 23  - -   ACIDBASEDEF 0.0 - 2.0 mmol/L 2.0 3.0(H) 3.0(H) - -   O2SAT % 99.0 98.0 98.0 - -       Capillary Blood Glucose: Lab Results  Component Value Date   GLUCAP 100 (H) 09/04/2021   GLUCAP 127 (H) 09/03/2021   GLUCAP 151 (H) 09/03/2021   GLUCAP 108 (H) 09/03/2021   GLUCAP 114 (H) 09/03/2021     Exercise Target Goals: Exercise Program Goal: Individual exercise prescription set using results from initial 6 min walk test and THRR while considering  patients activity barriers and safety.   Exercise Prescription Goal: Initial exercise prescription builds to 30-45 minutes a day of aerobic activity, 2-3 days per week.  Home exercise guidelines will be given to patient during program as part of exercise prescription that the participant will acknowledge.  Activity Barriers & Risk Stratification:  Activity Barriers & Cardiac Risk Stratification - 10/23/21 0900  Activity Barriers & Cardiac Risk Stratification   Activity Barriers Arthritis;Other (comment)    Comments Arthritis-low back    Cardiac Risk Stratification Moderate             6 Minute Walk:  6 Minute Walk     Row Name 10/23/21 0923         6 Minute Walk   Phase Initial     Distance 1693 feet     Walk Time 6 minutes     # of Rest Breaks 0     MPH 3.21     METS 3.11     RPE 11     Perceived Dyspnea  0     VO2 Peak 10.88     Symptoms No     Resting HR 58 bpm     Resting BP 140/60     Resting Oxygen Saturation  98 %     Exercise Oxygen Saturation  during 6 min walk 97 %     Max Ex. HR 91 bpm     Max Ex. BP 158/72     2 Minute Post BP 138/72              Oxygen Initial Assessment:   Oxygen Re-Evaluation:   Oxygen Discharge (Final Oxygen  Re-Evaluation):   Initial Exercise Prescription:  Initial Exercise Prescription - 10/23/21 0900       Date of Initial Exercise RX and Referring Provider   Date 10/23/21    Referring Provider Jettie Booze, MD    Expected Discharge Date 12/21/21      Bike   Level 1.1    Minutes 15    METs 3.16      NuStep   Level 3    SPM 85    Minutes 15    METs 3.2      Prescription Details   Frequency (times per week) 3    Duration Progress to 30 minutes of continuous aerobic without signs/symptoms of physical distress      Intensity   THRR 40-80% of Max Heartrate 58-115    Ratings of Perceived Exertion 11-13    Perceived Dyspnea 0-4      Progression   Progression Continue to progress workloads to maintain intensity without signs/symptoms of physical distress.      Resistance Training   Training Prescription Yes    Weight 4 lbs    Reps 10-15             Perform Capillary Blood Glucose checks as needed.  Exercise Prescription Changes:   Exercise Prescription Changes     Row Name 10/31/21 1033 11/12/21 1024 11/28/21 1022 12/10/21 1021       Response to Exercise   Blood Pressure (Admit) 124/72 128/62 118/60 120/58    Blood Pressure (Exercise) 180/70 138/78 190/72 130/68    Blood Pressure (Exit) 116/75 102/72 -- 98/62    Heart Rate (Admit) 56 bpm 74 bpm 55 bpm 61 bpm    Heart Rate (Exercise) 121 bpm 113 bpm 92 bpm 121 bpm    Heart Rate (Exit) 72 bpm 74 bpm 68 bpm 61 bpm    Rating of Perceived Exertion (Exercise) 13 13 11 11     Symptoms None None None None    Comments Elevated BP on bike, decreased WL. -- -- --    Duration Continue with 30 min of aerobic exercise without signs/symptoms of physical distress. Continue with 30 min of aerobic exercise without signs/symptoms of physical distress. Continue  with 30 min of aerobic exercise without signs/symptoms of physical distress. Continue with 30 min of aerobic exercise without signs/symptoms of physical distress.     Intensity THRR unchanged THRR unchanged THRR unchanged THRR unchanged      Progression   Progression Continue to progress workloads to maintain intensity without signs/symptoms of physical distress. Continue to progress workloads to maintain intensity without signs/symptoms of physical distress. Continue to progress workloads to maintain intensity without signs/symptoms of physical distress. Continue to progress workloads to maintain intensity without signs/symptoms of physical distress.    Average METs 2.8 4.2 3.7 4.2      Resistance Training   Training Prescription No  Relaxation day, no weights Yes No Yes    Weight -- 4 lbs -- 5 lbs    Reps -- 10-15 -- 10-15    Time -- 10 Minutes -- 10 Minutes      Interval Training   Interval Training No No No No      Bike   Level 1.2 1.5 1.4 1.7    Minutes 15 15 15 15     METs 3.36 3.89 3.74 4.36      NuStep   Level 3 4 5 5     SPM 85 85 85 85    Minutes 15 15 15 15     METs 2.3 4.6 3.6 4      Home Exercise Plan   Plans to continue exercise at -- Home (comment)  Walking Home (comment)  Walking Home (comment)  Walking    Frequency -- Add 2 additional days to program exercise sessions. Add 2 additional days to program exercise sessions. Add 2 additional days to program exercise sessions.    Initial Home Exercises Provided -- 11/07/21 11/07/21 11/07/21             Exercise Comments:   Exercise Comments     Row Name 10/31/21 1122 11/07/21 1107 11/28/21 1042 12/10/21 1105 12/12/21 1056   Exercise Comments Patient tolerated 1st session of exercise well without symptoms. Blood pressure elevated on stationary bike, decreased workload. Will continue to monitor. Reviewed home exercise guidelines and goals with patient. Reviewed METs and goals with patient. Reviewed METs and goals with patient. Will switch from stationary bike to treadmill at next session. Oriented patient to treadmill, tolerated well. Started at 2.5/0.0 increased to 3.0/0.0 after 12  minutes.            Exercise Goals and Review:   Exercise Goals     Row Name 10/23/21 0837             Exercise Goals   Increase Physical Activity Yes       Intervention Provide advice, education, support and counseling about physical activity/exercise needs.;Develop an individualized exercise prescription for aerobic and resistive training based on initial evaluation findings, risk stratification, comorbidities and participant's personal goals.       Expected Outcomes Short Term: Attend rehab on a regular basis to increase amount of physical activity.;Long Term: Exercising regularly at least 3-5 days a week.;Long Term: Add in home exercise to make exercise part of routine and to increase amount of physical activity.       Increase Strength and Stamina Yes       Intervention Provide advice, education, support and counseling about physical activity/exercise needs.;Develop an individualized exercise prescription for aerobic and resistive training based on initial evaluation findings, risk stratification, comorbidities and participant's personal goals.       Expected Outcomes Short Term: Increase workloads from  initial exercise prescription for resistance, speed, and METs.;Short Term: Perform resistance training exercises routinely during rehab and add in resistance training at home;Long Term: Improve cardiorespiratory fitness, muscular endurance and strength as measured by increased METs and functional capacity (6MWT)       Able to understand and use rate of perceived exertion (RPE) scale Yes       Intervention Provide education and explanation on how to use RPE scale       Expected Outcomes Short Term: Able to use RPE daily in rehab to express subjective intensity level;Long Term:  Able to use RPE to guide intensity level when exercising independently       Knowledge and understanding of Target Heart Rate Range (THRR) Yes       Intervention Provide education and explanation of THRR  including how the numbers were predicted and where they are located for reference       Expected Outcomes Short Term: Able to state/look up THRR;Long Term: Able to use THRR to govern intensity when exercising independently;Short Term: Able to use daily as guideline for intensity in rehab       Able to check pulse independently Yes       Intervention Review the importance of being able to check your own pulse for safety during independent exercise;Provide education and demonstration on how to check pulse in carotid and radial arteries.       Expected Outcomes Short Term: Able to explain why pulse checking is important during independent exercise;Long Term: Able to check pulse independently and accurately       Understanding of Exercise Prescription Yes       Intervention Provide education, explanation, and written materials on patient's individual exercise prescription       Expected Outcomes Short Term: Able to explain program exercise prescription;Long Term: Able to explain home exercise prescription to exercise independently                Exercise Goals Re-Evaluation :  Exercise Goals Re-Evaluation     Row Name 10/31/21 1122 11/07/21 1107 11/28/21 1042 12/10/21 1105       Exercise Goal Re-Evaluation   Exercise Goals Review Increase Physical Activity;Able to understand and use rate of perceived exertion (RPE) scale Increase Physical Activity;Able to understand and use rate of perceived exertion (RPE) scale;Increase Strength and Stamina;Able to check pulse independently;Knowledge and understanding of Target Heart Rate Range (THRR);Understanding of Exercise Prescription Increase Physical Activity;Able to understand and use rate of perceived exertion (RPE) scale;Increase Strength and Stamina;Able to check pulse independently;Knowledge and understanding of Target Heart Rate Range (THRR);Understanding of Exercise Prescription Increase Physical Activity;Able to understand and use rate of perceived  exertion (RPE) scale;Increase Strength and Stamina;Able to check pulse independently;Knowledge and understanding of Target Heart Rate Range (THRR);Understanding of Exercise Prescription    Comments Patient able to understand and use RPE scale appropriately. Reviewed exercise prescription with patient. Patient is walking 15 minutes 2-3 days/week. Discussed increasing duration to 30 minutes, and patient is amenable to this. Patient's goal is to increase leg strength and balance. Patient states he's already seen an improvement in both areas since starting cardiac rehab. Patient knows how to count his pulse. Patient feels he's making good progress with his exercise. He has chronic low back pain/tightness that he feels is better after he stretches/exercises. HIs goal is to reduce low back pain. Patient is walking some on the days he doesn't attend cardiac rehab and he has 5 lb weights at home that he  can use for his resistance training. [facility] and continue walking at home. Patient previously used the treadmill prior to his cardiac event and would like to switch from the bike to the TM. Will change at next session.    Expected Outcomes Progress workloads as tolerated to help increase cardiorespiratory fitness. Patient will increase walking duration to 30 minutes 2-3 days/week in addition to exercise at cardiac rehab to achieve at least 150 minutes of aerobic exercise per week. Continue to progress workloads as tolerated. Continue stretching to help reduce tightness in low back. Patient will switch to the treadmill. [facility] program.             Discharge Exercise Prescription (Final Exercise Prescription Changes):  Exercise Prescription Changes - 12/10/21 1021       Response to Exercise   Blood Pressure  (Admit) 120/58    Blood Pressure (Exercise) 130/68    Blood Pressure (Exit) 98/62    Heart Rate (Admit) 61 bpm    Heart Rate (Exercise) 121 bpm    Heart Rate (Exit) 61 bpm    Rating of Perceived Exertion (Exercise) 11    Symptoms None    Duration Continue with 30 min of aerobic exercise without signs/symptoms of physical distress.    Intensity THRR unchanged      Progression   Progression Continue to progress workloads to maintain intensity without signs/symptoms of physical distress.    Average METs 4.2      Resistance Training   Training Prescription Yes    Weight 5 lbs    Reps 10-15    Time 10 Minutes      Interval Training   Interval Training No      Bike   Level 1.7    Minutes 15    METs 4.36      NuStep   Level 5    SPM 85    Minutes 15    METs 4      Home Exercise Plan   Plans to continue exercise at Home (comment)   Walking   Frequency Add 2 additional days to program exercise sessions.    Initial Home Exercises Provided 11/07/21             Nutrition:  Target Goals: Understanding of nutrition guidelines, daily intake of sodium 1500mg , cholesterol 200mg , calories 30% from fat and 7% or less from saturated fats, daily to have 5 or more servings of fruits and vegetables.  Biometrics:  Pre Biometrics - 10/23/21 0825       Pre Biometrics   Waist Circumference 43.5 inches    Hip Circumference 44 inches    Waist to Hip Ratio 0.99 %    Triceps Skinfold 22 mm    % Body Fat 32.6 %    Grip Strength 36 kg    Flexibility 18.25 in    Single Leg Stand 1.37 seconds              Nutrition Therapy Plan and Nutrition Goals:   Nutrition Assessments:  MEDIFICTS Score Key: ?70 Need to make dietary changes  40-70 Heart Healthy Diet ? 40 Therapeutic Level Cholesterol Diet    Picture Your Plate Scores: <67 Unhealthy dietary pattern with much room for improvement. 41-50 Dietary pattern unlikely to meet recommendations for good  health and room for  improvement. 51-60 More healthful dietary pattern, with some room for improvement.  >60 Healthy dietary pattern, although there may be some specific behaviors that could be improved.    Nutrition Goals Re-Evaluation:   Nutrition Goals Re-Evaluation:   Nutrition Goals Discharge (Final Nutrition Goals Re-Evaluation):   Psychosocial: Target Goals: Acknowledge presence or absence of significant depression and/or stress, maximize coping skills, provide positive support system. Participant is able to verbalize types and ability to use techniques and skills needed for reducing stress and depression.  Initial Review & Psychosocial Screening:  Initial Psych Review & Screening - 10/23/21 1200       Initial Review   Current issues with None Identified      Family Dynamics   Good Support System? Yes    Comments Yvone Neu has a great support system which includes his wife who is a retired Teaching laboratory technician   Psychosocial barriers to participate in program There are no identifiable barriers or psychosocial needs.      Screening Interventions   Interventions Encouraged to exercise;Provide feedback about the scores to participant    Expected Outcomes Short Term goal: Identification and review with participant of any Quality of Life or Depression concerns found by scoring the questionnaire.;Long Term goal: The participant improves quality of Life and PHQ9 Scores as seen by post scores and/or verbalization of changes             Quality of Life Scores:  Quality of Life - 10/23/21 1004       Quality of Life   Select Quality of Life      Quality of Life Scores   Health/Function Pre 24.27 %    Socioeconomic Pre 23.43 %    Psych/Spiritual Pre 24 %    Family Pre 24.7 %    GLOBAL Pre 24.1 %            Scores of 19 and below usually indicate a poorer quality of life in these areas.  A difference of  2-3 points is a clinically meaningful difference.  A difference of 2-3 points in the  total score of the Quality of Life Index has been associated with significant improvement in overall quality of life, self-image, physical symptoms, and general health in studies assessing change in quality of life.  PHQ-9: Recent Review Flowsheet Data     Depression screen [facility] 2/9 11/06/2021 10/23/2021 09/14/2021 10/23/2020 12/06/2016   Decreased Interest 0 0 0 0 0   Down, Depressed, Hopeless 0 1 0 0 0   PHQ - 2 Score 0 1 0 0 0   Altered sleeping - 0 - - -   Tired, decreased energy - 1 - - -   Change in appetite - 0 - - -   Feeling bad or failure about yourself  - 0 - - -   Trouble concentrating - 0 - - -   Moving slowly or fidgety/restless - 0 - - -   Suicidal thoughts - 0 - - -   PHQ-9 Score - 2 - - -   Difficult doing work/chores - Not difficult at all - - -      Interpretation of Total Score  Total Score Depression Severity:  1-4 = Minimal depression, 5-9 = Mild depression, 10-14 = Moderate depression, 15-19 = Moderately severe depression, 20-27 = Severe depression   Psychosocial Evaluation and Intervention:   Psychosocial Re-Evaluation:  Psychosocial Re-Evaluation     Row Name  11/12/21 1715 12/11/21 1524           Psychosocial Re-Evaluation   Current issues with None Identified None Identified      Interventions Encouraged to attend Cardiac Rehabilitation for the exercise Encouraged to attend Cardiac Rehabilitation for the exercise      Continue Psychosocial Services  No Follow up required No Follow up required               Psychosocial Discharge (Final Psychosocial Re-Evaluation):  Psychosocial Re-Evaluation - 12/11/21 1524       Psychosocial Re-Evaluation   Current issues with None Identified    Interventions Encouraged to attend Cardiac Rehabilitation for the exercise    Continue Psychosocial Services  No Follow up required             Vocational Rehabilitation: Provide vocational rehab assistance to qualifying candidates.   Vocational Rehab  Evaluation & Intervention:   Education: Education Goals: Education classes will be provided on a weekly basis, covering required topics. Participant will state understanding/return demonstration of topics presented.  Learning Barriers/Preferences:   Education Topics: Count Your Pulse:  -Group instruction provided by verbal instruction, demonstration, patient participation and written materials to support subject.  Instructors address importance of being able to find your pulse and how to count your pulse when at home without a heart monitor.  Patients get hands on experience counting their pulse with staff help and individually.   Heart Attack, Angina, and Risk Factor Modification:  -Group instruction provided by verbal instruction, video, and written materials to support subject.  Instructors address signs and symptoms of angina and heart attacks.    Also discuss risk factors for heart disease and how to make changes to improve heart health risk factors.   Functional Fitness:  -Group instruction provided by verbal instruction, demonstration, patient participation, and written materials to support subject.  Instructors address safety measures for doing things around the house.  Discuss how to get up and down off the floor, how to pick things up properly, how to safely get out of a chair without assistance, and balance training.   Meditation and Mindfulness:  -Group instruction provided by verbal instruction, patient participation, and written materials to support subject.  Instructor addresses importance of mindfulness and meditation practice to help reduce stress and improve awareness.  Instructor also leads participants through a meditation exercise.    Stretching for Flexibility and Mobility:  -Group instruction provided by verbal instruction, patient participation, and written materials to support subject.  Instructors lead participants through series of stretches that are designed to  increase flexibility thus improving mobility.  These stretches are additional exercise for major muscle groups that are typically performed during regular warm up and cool down.   Hands Only CPR:  -Group verbal, video, and participation provides a basic overview of AHA guidelines for community CPR. Role-play of emergencies allow participants the opportunity to practice calling for help and chest compression technique with discussion of AED use.   Hypertension: -Group verbal and written instruction that provides a basic overview of hypertension including the most recent diagnostic guidelines, risk factor reduction with self-care instructions and medication management.    Nutrition I class: Heart Healthy Eating:  -Group instruction provided by PowerPoint slides, verbal discussion, and written materials to support subject matter. The instructor gives an explanation and review of the Therapeutic Lifestyle Changes diet recommendations, which includes a discussion on lipid goals, dietary fat, sodium, fiber, plant stanol/sterol esters, sugar, and the components of a well-balanced,  healthy diet.   Nutrition II class: Lifestyle Skills:  -Group instruction provided by PowerPoint slides, verbal discussion, and written materials to support subject matter. The instructor gives an explanation and review of label reading, grocery shopping for heart health, heart healthy recipe modifications, and ways to make healthier choices when eating out.   Diabetes Question & Answer:  -Group instruction provided by PowerPoint slides, verbal discussion, and written materials to support subject matter. The instructor gives an explanation and review of diabetes co-morbidities, pre- and post-prandial blood glucose goals, pre-exercise blood glucose goals, signs, symptoms, and treatment of hypoglycemia and hyperglycemia, and foot care basics.   Diabetes Blitz:  -Group instruction provided by PowerPoint slides, verbal  discussion, and written materials to support subject matter. The instructor gives an explanation and review of the physiology behind type 1 and type 2 diabetes, diabetes medications and rational behind using different medications, pre- and post-prandial blood glucose recommendations and Hemoglobin A1c goals, diabetes diet, and exercise including blood glucose guidelines for exercising safely.    Portion Distortion:  -Group instruction provided by PowerPoint slides, verbal discussion, written materials, and food models to support subject matter. The instructor gives an explanation of serving size versus portion size, changes in portions sizes over the last 20 years, and what consists of a serving from each food group.   Stress Management:  -Group instruction provided by verbal instruction, video, and written materials to support subject matter.  Instructors review role of stress in heart disease and how to cope with stress positively.     Exercising on Your Own:  -Group instruction provided by verbal instruction, power point, and written materials to support subject.  Instructors discuss benefits of exercise, components of exercise, frequency and intensity of exercise, and end points for exercise.  Also discuss use of nitroglycerin and activating EMS.  Review options of places to exercise outside of rehab.  Review guidelines for sex with heart disease.   Cardiac Drugs I:  -Group instruction provided by verbal instruction and written materials to support subject.  Instructor reviews cardiac drug classes: antiplatelets, anticoagulants, beta blockers, and statins.  Instructor discusses reasons, side effects, and lifestyle considerations for each drug class.   Cardiac Drugs II:  -Group instruction provided by verbal instruction and written materials to support subject.  Instructor reviews cardiac drug classes: angiotensin converting enzyme inhibitors (ACE-I), angiotensin II receptor blockers (ARBs),  nitrates, and calcium channel blockers.  Instructor discusses reasons, side effects, and lifestyle considerations for each drug class.   Anatomy and Physiology of the Circulatory System:  Group verbal and written instruction and models provide basic cardiac anatomy and physiology, with the coronary electrical and arterial systems. Review of: AMI, Angina, Valve disease, Heart Failure, Peripheral Artery Disease, Cardiac Arrhythmia, Pacemakers, and the ICD.   Other Education:  -Group or individual verbal, written, or video instructions that support the educational goals of the cardiac rehab program.   Holiday Eating Survival Tips:  -Group instruction provided by PowerPoint slides, verbal discussion, and written materials to support subject matter. The instructor gives patients tips, tricks, and techniques to help them not only survive but enjoy the holidays despite the onslaught of food that accompanies the holidays.   Knowledge Questionnaire Score:  Knowledge Questionnaire Score - 10/23/21 1004       Knowledge Questionnaire Score   Pre Score 19/24             Core Components/Risk Factors/Patient Goals at Admission:  Personal Goals and Risk Factors at Admission - 10/23/21  0836       Core Components/Risk Factors/Patient Goals on Admission    Weight Management Yes;Obesity;Weight Maintenance;Weight Loss    Intervention Weight Management/Obesity: Establish reasonable short term and long term weight goals.;Obesity: Provide education and appropriate resources to help participant work on and attain dietary goals.;Weight Management: Provide education and appropriate resources to help participant work on and attain dietary goals.    Admit Weight 211 lb 6.7 oz (95.9 kg)    Expected Outcomes Short Term: Continue to assess and modify interventions until short term weight is achieved;Long Term: Adherence to nutrition and physical activity/exercise program aimed toward attainment of established  weight goal;Weight Loss: Understanding of general recommendations for a balanced deficit meal plan, which promotes 1-2 lb weight loss per week and includes a negative energy balance of [phone] kcal/d;Weight Maintenance: Understanding of the daily nutrition guidelines, which includes 25-35% calories from fat, 7% or less cal from saturated fats, less than 200mg  cholesterol, less than 1.5gm of sodium, & 5 or more servings of fruits and vegetables daily    Hypertension Yes    Intervention Provide education on lifestyle modifcations including regular physical activity/exercise, weight management, moderate sodium restriction and increased consumption of fresh fruit, vegetables, and low fat dairy, alcohol moderation, and smoking cessation.;Monitor prescription use compliance.    Expected Outcomes Short Term: Continued assessment and intervention until BP is < 140/73mm HG in hypertensive participants. < 130/51mm HG in hypertensive participants with diabetes, heart failure or chronic kidney disease.;Long Term: Maintenance of blood pressure at goal levels.    Lipids Yes    Intervention Provide education and support for participant on nutrition & aerobic/resistive exercise along with prescribed medications to achieve LDL 70mg , HDL >40mg .    Expected Outcomes Short Term: Participant states understanding of desired cholesterol values and is compliant with medications prescribed. Participant is following exercise prescription and nutrition guidelines.;Long Term: Cholesterol controlled with medications as prescribed, with individualized exercise RX and with personalized nutrition plan. Value goals: LDL < 70mg , HDL > 40 mg.             Core Components/Risk Factors/Patient Goals Review:   Goals and Risk Factor Review     Row Name 11/12/21 1716 12/11/21 1524           Core Components/Risk Factors/Patient Goals Review   Personal Goals Review Weight Management/Obesity;Hypertension;Lipids Weight  Management/Obesity;Hypertension;Lipids      Review Yvone Neu is off to a good start to exercise at phase 2 cardiac rehab. Vital signs have been stable. Yvone Neu is off to a good start to exercise at phase 2 cardiac rehab. Vital signs remain stable.Yvone Neu will complete cardiac rehab at the end of the month.      Expected Outcomes Yvone Neu will continue to participate in phase 2 cardiac rehab for exercise, nutrition and lifestyle modifications Yvone Neu will continue to participate in phase 2 cardiac rehab for exercise, nutrition and lifestyle modifications               Core Components/Risk Factors/Patient Goals at Discharge (Final Review):   Goals and Risk Factor Review - 12/11/21 1524       Core Components/Risk Factors/Patient Goals Review   Personal Goals Review Weight Management/Obesity;Hypertension;Lipids    Review Yvone Neu is off to a good start to exercise at phase 2 cardiac rehab. Vital signs remain stable.Yvone Neu will complete cardiac rehab at the end of the month.    Expected Outcomes Yvone Neu will continue to participate in phase 2 cardiac rehab for exercise, nutrition and lifestyle modifications  ITP Comments:  ITP Comments     Row Name 10/23/21 0825 11/12/21 1714 12/11/21 1523       ITP Comments Medical Director- Dr. Fransico Him, MD 30 Day ITP Review. Yvone Neu has good attendance and participation in phase 2 cardiac rehab. Yvone Neu is doing well with exercise. 30 Day ITP Review. Yvone Neu has good attendance and participation in phase 2 cardiac rehab. Yvone Neu contiues to do well with exercise. Yvone Neu will complete phase 2 cardiac rehab at the end of the month.              Comments: See ITP Comments

## 2021-12-13 ENCOUNTER — Encounter (HOSPITAL_COMMUNITY): Payer: Self-pay | Admitting: Physician Assistant

## 2021-12-13 ENCOUNTER — Ambulatory Visit (HOSPITAL_COMMUNITY)
Admission: RE | Admit: 2021-12-13 | Discharge: 2021-12-13 | Disposition: A | Discharge status: Home / Self Care | Payer: Medicare Other | Source: Ambulatory Visit | Attending: Physician Assistant | Admitting: Physician Assistant

## 2021-12-13 ENCOUNTER — Encounter (HOSPITAL_COMMUNITY): Payer: Self-pay

## 2021-12-13 VITALS — BP 122/70 | HR 58 | Ht 71.0 in | Wt 212.2 lb

## 2021-12-13 DIAGNOSIS — I252 Old myocardial infarction: Secondary | ICD-10-CM | POA: Diagnosis not present

## 2021-12-13 DIAGNOSIS — D6869 Other thrombophilia: Secondary | ICD-10-CM | POA: Diagnosis not present

## 2021-12-13 DIAGNOSIS — Z7901 Long term (current) use of anticoagulants: Secondary | ICD-10-CM | POA: Insufficient documentation

## 2021-12-13 DIAGNOSIS — Z951 Presence of aortocoronary bypass graft: Secondary | ICD-10-CM | POA: Diagnosis not present

## 2021-12-13 DIAGNOSIS — I480 Paroxysmal atrial fibrillation: Secondary | ICD-10-CM

## 2021-12-13 DIAGNOSIS — I10 Essential (primary) hypertension: Secondary | ICD-10-CM | POA: Insufficient documentation

## 2021-12-13 DIAGNOSIS — I2510 Atherosclerotic heart disease of native coronary artery without angina pectoris: Secondary | ICD-10-CM | POA: Diagnosis not present

## 2021-12-13 DIAGNOSIS — I97190 Other postprocedural cardiac functional disturbances following cardiac surgery: Secondary | ICD-10-CM | POA: Diagnosis not present

## 2021-12-13 NOTE — Progress Notes (Signed)
Primary Care Physician: Colon Branch, MD Primary Cardiologist: Dr Irish Lack Primary Electrophysiologist: none Referring Physician: Dr Franki Monte is a 76 y.o. male with a history of CAD, HTN, atrial fibrillation who presents for follow up in the [facility].  The patient was initially diagnosed with atrial fibrillation post operatively following CABG for 3 vessel CAD. He presented to the ED 08/23/21 with symptoms of chest pain, diagnosed with NSTEMI. LHC showed 3 vessel disease and he underwent bypass surgery on 08/29/21. He found to have afib on 09/02/21 and was started on IV amiodarone. He converted to SR and was transitioned to oral amiodarone. He was also started on Eliquis for a CHADS2VASC score of 4.   On follow up today, patient reports that he has done well since his last visit. He denies any tachypalpitations. He is doing well with cardiac rehab. His amiodarone has been discontinued. He denies any bleeding issues on anticoagulation.   Today, he denies symptoms of palpitations, chest pain, shortness of breath, orthopnea, PND, lower extremity edema, dizziness, presyncope, syncope, snoring, daytime somnolence, bleeding, or neurologic sequela. The patient is tolerating medications without difficulties and is otherwise without complaint today.    Atrial Fibrillation Risk Factors:  he does not have symptoms or diagnosis of sleep apnea. he does not have a history of rheumatic fever.   he has a BMI of Body mass index is 29.6 kg/m.Marland Kitchen Filed Weights   12/13/21 1409  Weight: 96.3 kg     Family History  Problem Relation Age of Onset   Cancer Maternal Grandfather        bladder   Diabetes Paternal Grandfather        TIAs; CVA   Stroke Paternal Grandfather        early 15s   Heart attack Mother 39   Transient ischemic attack Paternal Uncle    Dementia Father        CVAs   Diabetes Father        borderline   Colon cancer Neg Hx      Atrial  Fibrillation Management history:  Previous antiarrhythmic drugs: amiodarone  Previous cardioversions: none Previous ablations: none CHADS2VASC score: 4 Anticoagulation history: Eliquis   Past Medical History:  Diagnosis Date   Elevated blood-pressure reading without diagnosis of hypertension    Other and unspecified hyperlipidemia    Prostate cancer ([facility]) 2011   Vitamin D deficiency    Past Surgical History:  Procedure Laterality Date   CORONARY ARTERY BYPASS GRAFT N/A 08/29/2021   Procedure: CORONARY ARTERY BYPASS GRAFTING (CABG)x 4 ON CARDIOPULMONARY BYPASS. LIMA TO LAD, SVG TO OM1, SVG TO PDA-OM2 SEQ.;  Surgeon: Melrose Nakayama, MD;  Location: [place];  Service: Open Heart Surgery;  Laterality: N/A;   ENDOVEIN HARVEST OF GREATER SAPHENOUS VEIN Right 08/29/2021   Procedure: ENDOVEIN HARVEST OF GREATER SAPHENOUS VEIN;  Surgeon: Melrose Nakayama, MD;  Location: [place];  Service: Open Heart Surgery;  Laterality: Right;   LEFT HEART CATH AND CORONARY ANGIOGRAPHY N/A 08/23/2021   Procedure: LEFT HEART CATH AND CORONARY ANGIOGRAPHY;  Surgeon: Jettie Booze, MD;  Location: [facility];  Service: Cardiovascular;  Laterality: N/A;   PROSTATECTOMY  2011   Dr Darcus Austin, [facility]   TEE WITHOUT CARDIOVERSION N/A 08/29/2021   Procedure: TRANSESOPHAGEAL ECHOCARDIOGRAM (TEE);  Surgeon: Melrose Nakayama, MD;  Location: [place];  Service: Open Heart Surgery;  Laterality: N/A;    Current Outpatient Medications  Medication Sig Dispense Refill   apixaban (ELIQUIS) 5 MG TABS tablet Take 1 tablet (5 mg total) by mouth 2 (two) times daily. 180 tablet 1   aspirin EC 81 MG tablet Take 81 mg by mouth every Monday, Tuesday, Wednesday, Thursday, and Friday. In the evening.     atorvastatin (LIPITOR) 80 MG tablet Take 1 tablet (80 mg total) by mouth daily. 90 tablet 3   Cholecalciferol (VITAMIN D3) 1000 UNITS CAPS Take 1,000 Units by mouth daily.     Magnesium 400 MG CAPS Take 400 mg by mouth  daily.     metoprolol succinate (TOPROL-XL) 25 MG 24 hr tablet Take 1 tablet (25 mg total) by mouth daily. 90 tablet 3   Multiple Vitamin (MULTIVITAMINS PO) Take 1 tablet by mouth daily.     No current facility-administered medications for this encounter.    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   Tobacco Use   Smoking status: Never   Smokeless tobacco: Never  Substance and Sexual Activity   Alcohol use: Not Currently    Comment: Rarely   Drug use: No   Sexual activity: Not on file  Other Topics Concern   Not on file  Social History Narrative   Fun: Biomedical scientist, golf    Social Determinants of Health   Financial Resource Strain: Low Risk    Difficulty of Paying Living Expenses: Not hard at all  Food Insecurity: No Food Insecurity   Worried About Charity fundraiser in the Last Year: Never true   Arboriculturist in the Last Year: Never true  Transportation Needs: No Transportation Needs   Lack of Transportation (Medical): No   Lack of Transportation (Non-Medical): No  Physical Activity: Inactive   Days of Exercise per Week: 0 days   Minutes of Exercise per Session: 0 min  Stress: No Stress Concern Present   Feeling of Stress : Not at all  Social Connections: Socially Integrated   Frequency of Communication with Friends and Family: More than three times a week   Frequency of Social Gatherings with Friends and Family: More than three times a week   Attends Religious Services: More than 4 times per year   Active Member of [employer] or Organizations: Yes   Attends Music therapist: More than 4  times per year   Marital Status: Married  Human resources officer Violence: Not At Risk   Fear of Current or Ex-Partner: No   Emotionally Abused: No   Physically Abused: No   Sexually Abused: No     ROS- All systems are reviewed and negative except as per the HPI above.  Physical Exam: Vitals:   12/13/21 1409  BP: 122/70  Pulse: (!) 58  Weight: 96.3 kg  Height: 5\' 11"  (1.803 m)    GEN- The patient is a well appearing elderly male, alert and oriented x 3 today.   HEENT-head normocephalic, atraumatic, sclera clear, conjunctiva pink, hearing intact, trachea midline. Lungs- Clear to ausculation bilaterally, normal work of  breathing Heart- Regular rate and rhythm, no murmurs, rubs or gallops  GI- soft, NT, ND, + BS Extremities- no clubbing, cyanosis, or edema MS- no significant deformity or atrophy Skin- no rash or lesion Psych- euthymic mood, full affect Neuro- strength and sensation are intact   Wt Readings from Last 3 Encounters:  12/13/21 96.3 kg  11/26/21 95.7 kg  11/06/21 95.9 kg    EKG today demonstrates  SB, RBBB Vent. rate 58 BPM PR interval 160 ms QRS duration 136 ms QT/QTcB 468/459 ms  Echo 08/24/21 demonstrated  1. Left ventricular ejection fraction, by estimation, is 65 to 70%. The  left ventricle has normal function. The left ventricle has no regional  wall motion abnormalities. Left ventricular diastolic parameters were normal.   2. Right ventricular systolic function is normal. The right ventricular  size is mildly enlarged.   3. The mitral valve is grossly normal. No evidence of mitral valve  regurgitation.   4. The aortic valve is grossly normal. There is mild calcification of the aortic valve. Aortic valve regurgitation is mild.   Epic records are reviewed at length today  CHA2DS2-VASc Score = 4  The patient's score is based upon: CHF History: 0 HTN History: 1 Diabetes History: 0 Stroke History: 0 Vascular Disease History: 1 Age Score: 2 Gender  Score: 0       ASSESSMENT AND PLAN: 1. Postoperative Atrial Fibrillation The patient's CHA2DS2-VASc score is 4, indicating a 4.8% annual risk of stroke.   Patient appears to be maintaining SR off amiodarone.  Zio monitor ordered to assess for arrhythmia, patient waiting for monitor to arrive.  Continue Eliquis 5 mg BID for now. If no afib noted on cardiac monitor, would discontinue.  2. Secondary Hypercoagulable State (ICD10:  D68.69) The patient is at significant risk for stroke/thromboembolism based upon his CHA2DS2-VASc Score of 4.  Continue Apixaban (Eliquis) for now.  3. HTN Stable, no changes today.  4. CAD S/p CABG No anginal symptoms.   Follow up with Dr Irish Lack per recall.    [facility] [address] [place], [place] [address] [phone] 12/13/2021 2:39 PM

## 2021-12-14 ENCOUNTER — Encounter (HOSPITAL_COMMUNITY)
Admission: RE | Admit: 2021-12-14 | Discharge: 2021-12-14 | Disposition: A | Discharge status: Home / Self Care | Payer: Medicare Other | Source: Ambulatory Visit | Attending: Interventional Cardiology | Admitting: Interventional Cardiology

## 2021-12-14 ENCOUNTER — Other Ambulatory Visit: Payer: Self-pay

## 2021-12-14 DIAGNOSIS — I480 Paroxysmal atrial fibrillation: Secondary | ICD-10-CM

## 2021-12-14 DIAGNOSIS — I25118 Atherosclerotic heart disease of native coronary artery with other forms of angina pectoris: Secondary | ICD-10-CM

## 2021-12-14 DIAGNOSIS — Z5189 Encounter for other specified aftercare: Secondary | ICD-10-CM | POA: Diagnosis not present

## 2021-12-14 DIAGNOSIS — Z951 Presence of aortocoronary bypass graft: Secondary | ICD-10-CM | POA: Diagnosis not present

## 2021-12-14 DIAGNOSIS — I214 Non-ST elevation (NSTEMI) myocardial infarction: Secondary | ICD-10-CM

## 2021-12-14 DIAGNOSIS — I252 Old myocardial infarction: Secondary | ICD-10-CM | POA: Diagnosis not present

## 2021-12-17 ENCOUNTER — Encounter (HOSPITAL_COMMUNITY)
Admission: RE | Admit: 2021-12-17 | Discharge: 2021-12-17 | Disposition: A | Discharge status: Home / Self Care | Payer: Medicare Other | Source: Ambulatory Visit | Attending: Interventional Cardiology | Admitting: Interventional Cardiology

## 2021-12-17 ENCOUNTER — Other Ambulatory Visit: Payer: Self-pay

## 2021-12-17 DIAGNOSIS — Z951 Presence of aortocoronary bypass graft: Secondary | ICD-10-CM

## 2021-12-17 DIAGNOSIS — Z5189 Encounter for other specified aftercare: Secondary | ICD-10-CM | POA: Diagnosis not present

## 2021-12-17 DIAGNOSIS — I214 Non-ST elevation (NSTEMI) myocardial infarction: Secondary | ICD-10-CM

## 2021-12-17 DIAGNOSIS — I252 Old myocardial infarction: Secondary | ICD-10-CM | POA: Diagnosis not present

## 2021-12-19 ENCOUNTER — Encounter (HOSPITAL_COMMUNITY)
Admission: RE | Admit: 2021-12-19 | Discharge: 2021-12-19 | Disposition: A | Discharge status: Home / Self Care | Payer: Medicare Other | Source: Ambulatory Visit | Attending: Interventional Cardiology | Admitting: Interventional Cardiology

## 2021-12-19 ENCOUNTER — Other Ambulatory Visit: Payer: Self-pay

## 2021-12-19 DIAGNOSIS — I252 Old myocardial infarction: Secondary | ICD-10-CM | POA: Diagnosis not present

## 2021-12-19 DIAGNOSIS — Z951 Presence of aortocoronary bypass graft: Secondary | ICD-10-CM

## 2021-12-19 DIAGNOSIS — Z5189 Encounter for other specified aftercare: Secondary | ICD-10-CM | POA: Diagnosis not present

## 2021-12-19 DIAGNOSIS — I214 Non-ST elevation (NSTEMI) myocardial infarction: Secondary | ICD-10-CM

## 2021-12-21 ENCOUNTER — Encounter (HOSPITAL_COMMUNITY)
Admission: RE | Admit: 2021-12-21 | Discharge: 2021-12-21 | Disposition: A | Discharge status: Home / Self Care | Payer: Medicare Other | Source: Ambulatory Visit | Attending: Interventional Cardiology | Admitting: Interventional Cardiology

## 2021-12-21 ENCOUNTER — Other Ambulatory Visit: Payer: Self-pay

## 2021-12-21 VITALS — BP 112/64 | HR 60 | Ht 68.25 in | Wt 212.7 lb

## 2021-12-21 DIAGNOSIS — Z951 Presence of aortocoronary bypass graft: Secondary | ICD-10-CM | POA: Diagnosis not present

## 2021-12-21 DIAGNOSIS — I214 Non-ST elevation (NSTEMI) myocardial infarction: Secondary | ICD-10-CM

## 2021-12-21 DIAGNOSIS — I252 Old myocardial infarction: Secondary | ICD-10-CM | POA: Diagnosis not present

## 2021-12-21 DIAGNOSIS — Z5189 Encounter for other specified aftercare: Secondary | ICD-10-CM | POA: Diagnosis not present

## 2021-12-21 NOTE — Progress Notes (Signed)
Discharge Progress Report  Patient Details  Name: Kyle Strickland MRN: [id number] Date of Birth: [birth year] Referring Provider:   Flowsheet Row CARDIAC REHAB PHASE II ORIENTATION from 10/23/2021 in [place]  Referring Provider Jettie Booze, MD        Number of Visits: 22  Reason for Discharge:  Patient reached a stable level of exercise. Patient independent in their exercise. Patient has met program and personal goals.  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  08/23/21 NSTEMI  ADL UCSD:   Initial Exercise Prescription:  Initial Exercise Prescription - 10/23/21 0900       Date of Initial Exercise RX and Referring Provider   Date 10/23/21    Referring Provider Jettie Booze, MD    Expected Discharge Date 12/21/21      Bike   Level 1.1    Minutes 15    METs 3.16      NuStep   Level 3    SPM 85    Minutes 15    METs 3.2      Prescription Details   Frequency (times per week) 3    Duration Progress to 30 minutes of continuous aerobic without signs/symptoms of physical distress      Intensity   THRR 40-80% of Max Heartrate 58-115    Ratings of Perceived Exertion 11-13    Perceived Dyspnea 0-4      Progression   Progression Continue to progress workloads to maintain intensity without signs/symptoms of physical distress.      Resistance Training   Training Prescription Yes    Weight 4 lbs    Reps 10-15             Discharge Exercise Prescription (Final Exercise Prescription Changes):  Exercise Prescription Changes - 12/21/21 1020       Response to Exercise   Blood Pressure (Admit) 112/64    Blood Pressure (Exercise) 162/84    Blood Pressure (Exit) 100/72    Heart Rate (Admit) 60 bpm    Heart Rate (Exercise) 91 bpm    Heart Rate (Exit) 67 bpm    Rating of Perceived Exertion (Exercise) 9.5    Symptoms None     Comments Patient completed last cardiac rehab execise session.    Duration Continue with 30 min of aerobic exercise without signs/symptoms of physical distress.    Intensity THRR unchanged      Progression   Progression Continue to progress workloads to maintain intensity without signs/symptoms of physical distress.    Average METs 4.1      Resistance Training   Training Prescription Yes    Weight 5 lbs    Reps 10-15    Time 10 Minutes      Interval Training   Interval Training No      Treadmill   MPH 3.2   Patient increased speed to 3.21mh last 5 minutes of the station.   Grade 0    Minutes 15    METs 3.45      NuStep   Level 5    SPM 85    Minutes 15    METs 4.6      Home Exercise Plan   Plans to continue exercise at Home (comment)   Walking   Frequency Add 2 additional days to program exercise sessions.    Initial Home Exercises Provided  11/07/21             Functional Capacity:  6 Minute Walk     Row Name 10/23/21 0923 12/17/21 1037       6 Minute Walk   Phase Initial Discharge    Distance 1693 feet 2080 feet    Distance % Change -- 22.86 %    Distance Feet Change -- 387 ft    Walk Time 6 minutes 6 minutes    # of Rest Breaks 0 0    MPH 3.21 3.94    METS 3.11 3.88    RPE 11 9    Perceived Dyspnea  0 0    VO2 Peak 10.88 13.58    Symptoms No No    Resting HR 58 bpm 62 bpm    Resting BP 140/60 120/62    Resting Oxygen Saturation  98 % --    Exercise Oxygen Saturation  during 6 min walk 97 % --    Max Ex. HR 91 bpm 104 bpm    Max Ex. BP 158/72 152/72    2 Minute Post BP 138/72 118/78             Psychological, QOL, Others - Outcomes: PHQ 2/9: Depression screen [facility] 2/9 12/21/2021 11/06/2021 10/23/2021 09/14/2021 10/23/2020  Decreased Interest 0 0 0 0 0  Down, Depressed, Hopeless 0 0 1 0 0  PHQ - 2 Score 0 0 1 0 0  Altered sleeping - - 0 - -  Tired, decreased energy - - 1 - -  Change in appetite - - 0 - -  Feeling bad or failure about  yourself  - - 0 - -  Trouble concentrating - - 0 - -  Moving slowly or fidgety/restless - - 0 - -  Suicidal thoughts - - 0 - -  PHQ-9 Score - - 2 - -  Difficult doing work/chores - - Not difficult at all - -    Quality of Life:  Quality of Life - 12/14/21 1633       Quality of Life   Select Quality of Life      Quality of Life Scores   Health/Function Pre 24.27 %    Health/Function Post 24.9 %    Health/Function % Change 2.6 %    Socioeconomic Pre 23.43 %    Socioeconomic Post 24.64 %    Socioeconomic % Change  5.16 %    Psych/Spiritual Pre 24 %    Psych/Spiritual Post 24.29 %    Psych/Spiritual % Change 1.21 %    Family Pre 24.7 %    Family Post 24.7 %    Family % Change 0 %    GLOBAL Pre 24.1 %    GLOBAL Post 24.69 %    GLOBAL % Change 2.45 %             Personal Goals: Goals established at orientation with interventions provided to work toward goal.  Personal Goals and Risk Factors at Admission - 10/23/21 0836       Core Components/Risk Factors/Patient Goals on Admission    Weight Management Yes;Obesity;Weight Maintenance;Weight Loss    Intervention Weight Management/Obesity: Establish reasonable short term and long term weight goals.;Obesity: Provide education and appropriate resources to help participant work on and attain dietary goals.;Weight Management: Provide education and appropriate resources to help participant work on and attain dietary goals.    Admit Weight 211 lb 6.7 oz (95.9 kg)    Expected Outcomes Short Term: Continue  to assess and modify interventions until short term weight is achieved;Long Term: Adherence to nutrition and physical activity/exercise program aimed toward attainment of established weight goal;Weight Loss: Understanding of general recommendations for a balanced deficit meal plan, which promotes 1-2 lb weight loss per week and includes a negative energy balance of [phone] kcal/d;Weight Maintenance: Understanding of the daily nutrition  guidelines, which includes 25-35% calories from fat, 7% or less cal from saturated fats, less than 299m cholesterol, less than 1.5gm of sodium, & 5 or more servings of fruits and vegetables daily    Hypertension Yes    Intervention Provide education on lifestyle modifcations including regular physical activity/exercise, weight management, moderate sodium restriction and increased consumption of fresh fruit, vegetables, and low fat dairy, alcohol moderation, and smoking cessation.;Monitor prescription use compliance.    Expected Outcomes Short Term: Continued assessment and intervention until BP is < 140/965mHG in hypertensive participants. < 130/804mG in hypertensive participants with diabetes, heart failure or chronic kidney disease.;Long Term: Maintenance of blood pressure at goal levels.    Lipids Yes    Intervention Provide education and support for participant on nutrition & aerobic/resistive exercise along with prescribed medications to achieve LDL <76m22mDL >40mg17m Expected Outcomes Short Term: Participant states understanding of desired cholesterol values and is compliant with medications prescribed. Participant is following exercise prescription and nutrition guidelines.;Long Term: Cholesterol controlled with medications as prescribed, with individualized exercise RX and with personalized nutrition plan. Value goals: LDL < 76mg,37m > 40 mg.              Personal Goals Discharge:  Goals and Risk Factor Review     Row Name 11/12/21 1716 12/11/21 1524           Core Components/Risk Factors/Patient Goals Review   Personal Goals Review Weight Management/Obesity;Hypertension;Lipids Weight Management/Obesity;Hypertension;Lipids      Review Ken isYvone Neuf to a good start to exercise at phase 2 cardiac rehab. Vital signs have been stable. Ken isYvone Neuf to a good start to exercise at phase 2 cardiac rehab. Vital signs remain stable.Ken wiYvone Neucomplete cardiac rehab at the end of the month.       Expected Outcomes Ken wiYvone Neucontinue to participate in phase 2 cardiac rehab for exercise, nutrition and lifestyle modifications Ken wiYvone Neucontinue to participate in phase 2 cardiac rehab for exercise, nutrition and lifestyle modifications               Exercise Goals and Review:  Exercise Goals     Row Name 10/23/21 0837             Exercise Goals   Increase Physical Activity Yes       Intervention Provide advice, education, support and counseling about physical activity/exercise needs.;Develop an individualized exercise prescription for aerobic and resistive training based on initial evaluation findings, risk stratification, comorbidities and participant's personal goals.       Expected Outcomes Short Term: Attend rehab on a regular basis to increase amount of physical activity.;Long Term: Exercising regularly at least 3-5 days a week.;Long Term: Add in home exercise to make exercise part of routine and to increase amount of physical activity.       Increase Strength and Stamina Yes       Intervention Provide advice, education, support and counseling about physical activity/exercise needs.;Develop an individualized exercise prescription for aerobic and resistive training based on initial evaluation findings, risk stratification, comorbidities and participant's personal goals.  Expected Outcomes Short Term: Increase workloads from initial exercise prescription for resistance, speed, and METs.;Short Term: Perform resistance training exercises routinely during rehab and add in resistance training at home;Long Term: Improve cardiorespiratory fitness, muscular endurance and strength as measured by increased METs and functional capacity (6MWT)       Able to understand and use rate of perceived exertion (RPE) scale Yes       Intervention Provide education and explanation on how to use RPE scale       Expected Outcomes Short Term: Able to use RPE daily in rehab to express subjective  intensity level;Long Term:  Able to use RPE to guide intensity level when exercising independently       Knowledge and understanding of Target Heart Rate Range (THRR) Yes       Intervention Provide education and explanation of THRR including how the numbers were predicted and where they are located for reference       Expected Outcomes Short Term: Able to state/look up THRR;Long Term: Able to use THRR to govern intensity when exercising independently;Short Term: Able to use daily as guideline for intensity in rehab       Able to check pulse independently Yes       Intervention Review the importance of being able to check your own pulse for safety during independent exercise;Provide education and demonstration on how to check pulse in carotid and radial arteries.       Expected Outcomes Short Term: Able to explain why pulse checking is important during independent exercise;Long Term: Able to check pulse independently and accurately       Understanding of Exercise Prescription Yes       Intervention Provide education, explanation, and written materials on patient's individual exercise prescription       Expected Outcomes Short Term: Able to explain program exercise prescription;Long Term: Able to explain home exercise prescription to exercise independently                Exercise Goals Re-Evaluation:  Exercise Goals Re-Evaluation     Row Name 10/31/21 1122 11/07/21 1107 11/28/21 1042 12/10/21 1105 12/17/21 1145     Exercise Goal Re-Evaluation   Exercise Goals Review Increase Physical Activity;Able to understand and use rate of perceived exertion (RPE) scale Increase Physical Activity;Able to understand and use rate of perceived exertion (RPE) scale;Increase Strength and Stamina;Able to check pulse independently;Knowledge and understanding of Target Heart Rate Range (THRR);Understanding of Exercise Prescription Increase Physical Activity;Able to understand and use rate of perceived exertion (RPE)  scale;Increase Strength and Stamina;Able to check pulse independently;Knowledge and understanding of Target Heart Rate Range (THRR);Understanding of Exercise Prescription Increase Physical Activity;Able to understand and use rate of perceived exertion (RPE) scale;Increase Strength and Stamina;Able to check pulse independently;Knowledge and understanding of Target Heart Rate Range (THRR);Understanding of Exercise Prescription Increase Physical Activity;Able to understand and use rate of perceived exertion (RPE) scale;Increase Strength and Stamina;Able to check pulse independently;Knowledge and understanding of Target Heart Rate Range (THRR);Understanding of Exercise Prescription   Comments Patient able to understand and use RPE scale appropriately. Reviewed exercise prescription with patient. Patient is walking 15 minutes 2-3 days/week. Discussed increasing duration to 30 minutes, and patient is amenable to this. Patient's goal is to increase leg strength and balance. Patient states he's already seen an improvement in both areas since starting cardiac rehab. Patient knows how to count his pulse. Patient feels he's making good progress with his exercise. He has chronic low back pain/tightness that he  feels is better after he stretches/exercises. HIs goal is to reduce low back pain. Patient is walking some on the days he doesn't attend cardiac rehab and he has 5 lb weights at home that he can use for his resistance training. [facility] and continue walking at home. Patient previously used the treadmill prior to his cardiac event and would like to switch from the bike to the TM. Will change at next session. Patient's functional capacity increased 23% as measured by 6MWT, strength increased 17% as measured by grip strenth test. Patient scheduled to complete cardiac rehab on Friday 12/21/21. Patient is  tolerating change to TM well.   Expected Outcomes Progress workloads as tolerated to help increase cardiorespiratory fitness. Patient will increase walking duration to 30 minutes 2-3 days/week in addition to exercise at cardiac rehab to achieve at least 150 minutes of aerobic exercise per week. Continue to progress workloads as tolerated. Continue stretching to help reduce tightness in low back. Patient will switch to the treadmill. [facility] of [facility] rehab program. Patient will continue current exercise routine to help maintain health and fitness gains.    [place] Name 12/21/21 1043             Exercise Goal Re-Evaluation   Exercise Goals Review Increase Physical Activity;Able to understand and use rate of perceived exertion (RPE) scale;Increase Strength and Stamina;Able to check pulse independently;Knowledge and understanding of Target Heart Rate Range (THRR);Understanding of Exercise Prescription       Comments Patient completed the cardiac rehab program and progressed well, achieving 4.2 METs with exercise. patient feels that his stamina is much better, and he doesn't have any shortness of breath. Patient plans to walk and exercise at the gym. Patient has 5 lb weights at home for his resistance training.       Expected Outcomes Patient will walk and exercise at the gym at least 30 minutes 4-6 days/week to achieve 150 minutes of aerobic exercise and resistance training 2-3 days/week.                Nutrition & Weight - Outcomes:  Pre Biometrics - 10/23/21 0825       Pre Biometrics   Waist Circumference 43.5 inches    Hip Circumference 44 inches    Waist to Hip Ratio 0.99 %    Triceps Skinfold 22 mm    % Body Fat 32.6 %    Grip Strength 36 kg    Flexibility 18.25 in    Single Leg Stand 1.37 seconds             Post Biometrics - 12/21/21 1033        Post  Biometrics   Height 5' 8.25" (1.734 m)    Waist  Circumference 42 inches    Hip Circumference 45.25 inches    Waist to Hip Ratio 0.93 %    BMI (Calculated) 32.09    Triceps Skinfold 25 mm    % Body Fat 32.5 %    Grip Strength 42 kg    Flexibility 21.5 in    Single Leg Stand 5.5 seconds             Nutrition:   Nutrition Discharge:   Education Questionnaire Score:  Knowledge Questionnaire Score - 12/14/21 1633       Knowledge  Questionnaire Score   Pre Score 19/24    Post Score 24/24             Goals reviewed with patient; copy given to patient. Pt graduated from cardiac rehab program on 12/21/21  with completion of 22 exercise sessions in Phase II. Pt maintained good attendance and progressed nicely during his participation in rehab as evidenced by increased MET level.   Medication list reconciled. Repeat  PHQ score- 0 .  Pt has made significant lifestyle changes and should be commended for his success. Pt feels he has achieved his goals during cardiac rehab.   Pt plans to continue exercise by walking and exercising at the  [facility]. Yvone Neu increased his distance on his post exercise walk test by 380 feet. Yvone Neu feels stronger we are proud of Ken's progress! Yvone Neu reports feeling stronger Harrell Gave RN BSN

## 2022-01-04 DIAGNOSIS — I480 Paroxysmal atrial fibrillation: Secondary | ICD-10-CM | POA: Diagnosis not present

## 2022-01-04 DIAGNOSIS — I25118 Atherosclerotic heart disease of native coronary artery with other forms of angina pectoris: Secondary | ICD-10-CM | POA: Diagnosis not present

## 2022-01-08 ENCOUNTER — Encounter: Payer: Self-pay | Admitting: Cardiology

## 2022-01-08 NOTE — Telephone Encounter (Signed)
Does he need an appointment?

## 2022-01-10 ENCOUNTER — Telehealth: Payer: Self-pay | Admitting: *Deleted

## 2022-01-10 NOTE — Telephone Encounter (Signed)
I spoke with patient and reviewed results with him.  He is asking if OK to stop Eliquis.  ?Patient reports he is feeling good.  States he has a sore area at the bottom of his rib cage.  Does not feel it all the time.  Usually occurs when coughing but will sometimes feel when he is sitting still.  He is asking if this could be related to healing from recent bypass surgery.  ?

## 2022-01-10 NOTE — Telephone Encounter (Signed)
-----   Message from Jettie Booze, MD sent at 01/10/2022  3:50 PM EDT ----- ?? Normal sinus rhythm with rare PACs and PVCs. ?? Rare, brief runs of PACs. No associated symptoms. ?? Patient symptoms correlated to sinus rhythm. ?? No atrial fibrillation. No significant pauses ? ?F/u in 10/23 ?

## 2022-01-10 NOTE — Telephone Encounter (Signed)
Ok to stop eliquis ?

## 2022-01-11 NOTE — Telephone Encounter (Signed)
Patient notified he can stop Eliquis.  He will discuss soreness near rib cage/breast bone at appointment later this Spring.  He is aware to let us know if this worsens  ?

## 2022-02-21 NOTE — Progress Notes (Signed)
? ? ? ? ?HPI: Follow-up coronary artery disease and atrial fibrillation.  Previously followed by Dr. Beau Fanny. Patient presented with non-ST elevation myocardial infarction October 2022.  Cardiac catheterization revealed severe three-vessel coronary artery disease and normal LV function.  Echocardiogram October 2022 showed normal LV function, mild right ventricular enlargement and mild aortic insufficiency.  Carotid Dopplers October 2022 near normal.  Patient did have postoperative atrial fibrillation.  Follow-up monitor March 2023 showed normal sinus rhythm with rare PAC and PVC but no atrial fibrillation or pauses.  Since last seen the patient has dyspnea with more extreme activities but not with routine activities. It is relieved with rest. It is not associated with chest pain. There is no orthopnea, PND or pedal edema. There is no syncope or palpitations. There is no exertional chest pain. ? ? ?Current Outpatient Medications  ?Medication Sig Dispense Refill  ? aspirin EC 81 MG tablet Take 81 mg by mouth every Monday, Tuesday, Wednesday, Thursday, and Friday. In the evening.    ? atorvastatin (LIPITOR) 80 MG tablet Take 1 tablet (80 mg total) by mouth daily. 90 tablet 3  ? Cholecalciferol (VITAMIN D3) 1000 UNITS CAPS Take 1,000 Units by mouth daily.    ? Magnesium 500 MG TABS Take 1 tablet by mouth daily.    ? metoprolol succinate (TOPROL-XL) 25 MG 24 hr tablet Take 1 tablet (25 mg total) by mouth daily. 90 tablet 3  ? Multiple Vitamin (MULTIVITAMINS PO) Take 1 tablet by mouth daily.    ? ?No current facility-administered medications for this visit.  ? ? ? ?Past Medical History:  ?Diagnosis Date  ? Elevated blood-pressure reading without diagnosis of hypertension   ? Other and unspecified hyperlipidemia   ? Prostate cancer [facility]) 2011  ? Vitamin D deficiency   ? ? ?Past Surgical History:  ?Procedure Laterality Date  ? CORONARY ARTERY BYPASS GRAFT N/A 08/29/2021  ? Procedure: CORONARY ARTERY BYPASS GRAFTING (CABG)x  4 ON CARDIOPULMONARY BYPASS. LIMA TO LAD, SVG TO OM1, SVG TO PDA-OM2 SEQ.;  Surgeon: Melrose Nakayama, MD;  Location: [place];  Service: Open Heart Surgery;  Laterality: N/A;  ? ENDOVEIN HARVEST OF GREATER SAPHENOUS VEIN Right 08/29/2021  ? Procedure: ENDOVEIN HARVEST OF GREATER SAPHENOUS VEIN;  Surgeon: Melrose Nakayama, MD;  Location: [place];  Service: Open Heart Surgery;  Laterality: Right;  ? LEFT HEART CATH AND CORONARY ANGIOGRAPHY N/A 08/23/2021  ? Procedure: LEFT HEART CATH AND CORONARY ANGIOGRAPHY;  Surgeon: Jettie Booze, MD;  Location: [place] [facility];  Service: Cardiovascular;  Laterality: N/A;  ? PROSTATECTOMY  2011  ? Dr Darcus [facility], [facility]  ? TEE WITHOUT CARDIOVERSION N/A 08/29/2021  ? Procedure: TRANSESOPHAGEAL ECHOCARDIOGRAM (TEE);  Surgeon: Melrose Nakayama, MD;  Location: [place];  Service: Open Heart Surgery;  Laterality: N/A;  ? ? ?Social History  ? ?Socioeconomic History  ? Marital status: Married  ?  Spouse name: Not on file  ? Number of children: 2  ? Years of education: 29  ? Highest education level: Not on file  ?Occupational History  ? 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 Smoking status: Never  ? Smokeless tobacco: Never  ?Substance and Sexual Activity  ? Alcohol use: Not Currently  ?  Comment: Rarely  ? Drug use: No  ? Sexual activity: Not on file  ?Other Topics Concern  ? Not on file  ?Social History Narrative  ? Fun: Motorcycles, golf   ? ?Social Determinants of Health  ? ?  Financial Resource Strain: Low Risk   ? Difficulty of Paying Living Expenses: Not hard at all  ?Food Insecurity: No Food Insecurity  ? Worried About Charity fundraiser in the Last Year: Never true  ? Ran Out of Food in the Last Year: Never true  ?Transportation Needs: No Transportation Needs  ? Lack of Transportation (Medical): No  ? Lack of Transportation (Non-Medical): No  ?Physical Activity: Inactive  ? Days of Exercise per Week: 0 days  ? Minutes of Exercise per Session: 0 min   ?Stress: No Stress Concern Present  ? Feeling of Stress : Not at all  ?Social Connections: Socially Integrated  ? Frequency of Communication with Friends and Family: More than three times a week  ? Frequency of Social Gatherings with Friends and Family: More than three times a week  ? Attends Religious Services: More than 4 times per year  ? Active Member of Clubs or Organizations: Yes  ? Attends Archivist Meetings: More than 4 times per year  ? Marital Status: Married  ?Intimate Partner Violence: Not At Risk  ? Fear of Current or Ex-Partner: No  ? Emotionally Abused: No  ? Physically Abused: No  ? Sexually Abused: No  ? ? ?Family History  ?Problem Relation Age of Onset  ? Cancer Maternal Grandfather   ?     bladder  ? Diabetes Paternal Grandfather   ?     TIAs; CVA  ? Stroke Paternal Grandfather   ?     early 58s  ? Heart attack Mother 88  ? Transient ischemic attack Paternal Uncle   ? Dementia Father   ?     CVAs  ? Diabetes Father   ?     borderline  ? Colon cancer Neg Hx   ? ? ?ROS: no fevers or chills, productive cough, hemoptysis, dysphasia, odynophagia, melena, hematochezia, dysuria, hematuria, rash, seizure activity, orthopnea, PND, pedal edema, claudication. Remaining systems are negative. ? ?Physical Exam: ?Well-developed well-nourished in no acute distress.  ?Skin is warm and dry.  ?HEENT is normal.  ?Neck is supple.  ?Chest is clear to auscultation with normal expansion.  ?Cardiovascular exam is regular rate and rhythm.  ?Abdominal exam nontender or distended. No masses palpated. ?Extremities show no edema. ?neuro grossly intact ? ?ECG- personally reviewed ? ?A/P ? ?1 coronary artery disease status post coronary artery bypass graft-patient denies recurrent chest pain.  Continue medical therapy with aspirin and statin. ? ?2 postoperative atrial fibrillation-no further atrial fibrillation noted on monitor and amiodarone previously discontinued.   ? ?3 hyperlipidemia-continue statin. ? ?4  hypertension-patient's blood pressure is controlled.  Continue present medications. ? ?Kirk Ruths, MD ? ? ? ?

## 2022-03-06 ENCOUNTER — Ambulatory Visit: Payer: Medicare Other | Admitting: Cardiology

## 2022-03-06 ENCOUNTER — Encounter: Payer: Self-pay | Admitting: Cardiology

## 2022-03-06 VITALS — BP 112/70 | HR 55 | Ht 71.0 in | Wt 212.8 lb

## 2022-03-06 DIAGNOSIS — E782 Mixed hyperlipidemia: Secondary | ICD-10-CM | POA: Diagnosis not present

## 2022-03-06 DIAGNOSIS — I10 Essential (primary) hypertension: Secondary | ICD-10-CM

## 2022-03-06 DIAGNOSIS — I480 Paroxysmal atrial fibrillation: Secondary | ICD-10-CM

## 2022-03-06 DIAGNOSIS — I25118 Atherosclerotic heart disease of native coronary artery with other forms of angina pectoris: Secondary | ICD-10-CM | POA: Diagnosis not present

## 2022-03-06 NOTE — Patient Instructions (Signed)

## 2022-05-09 ENCOUNTER — Ambulatory Visit: Payer: Medicare Other | Admitting: Internal Medicine

## 2022-05-10 ENCOUNTER — Ambulatory Visit (INDEPENDENT_AMBULATORY_CARE_PROVIDER_SITE_OTHER): Payer: Medicare Other | Admitting: Internal Medicine

## 2022-05-10 ENCOUNTER — Other Ambulatory Visit: Payer: Self-pay | Admitting: Internal Medicine

## 2022-05-10 ENCOUNTER — Encounter: Payer: Self-pay | Admitting: Internal Medicine

## 2022-05-10 VITALS — BP 134/64 | HR 53 | Temp 97.8°F | Resp 16 | Ht 71.0 in | Wt 210.5 lb

## 2022-05-10 DIAGNOSIS — I480 Paroxysmal atrial fibrillation: Secondary | ICD-10-CM

## 2022-05-10 DIAGNOSIS — I2510 Atherosclerotic heart disease of native coronary artery without angina pectoris: Secondary | ICD-10-CM | POA: Diagnosis not present

## 2022-05-10 DIAGNOSIS — E782 Mixed hyperlipidemia: Secondary | ICD-10-CM | POA: Diagnosis not present

## 2022-05-10 DIAGNOSIS — N62 Hypertrophy of breast: Secondary | ICD-10-CM

## 2022-05-10 LAB — BASIC METABOLIC PANEL
BUN: 21 mg/dL (ref 6–23)
CO2: 30 mEq/L (ref 19–32)
Calcium: 9.5 mg/dL (ref 8.4–10.5)
Chloride: 104 mEq/L (ref 96–112)
Creatinine, Ser: 1.01 mg/dL (ref 0.40–1.50)
GFR: 72.16 mL/min (ref >60.00)
Glucose, Bld: 91 mg/dL (ref 70–99)
Potassium: 4.8 mEq/L (ref 3.5–5.1)
Sodium: 140 mEq/L (ref 135–145)

## 2022-05-10 LAB — LIPID PANEL
Cholesterol: 104 mg/dL (ref 0–200)
HDL: 47.3 mg/dL (ref >39.00)
LDL Cholesterol: 40 mg/dL (ref 0–99)
NonHDL: 56.97
Total CHOL/HDL Ratio: 2
Triglycerides: 87 mg/dL (ref 0.0–149.0)
VLDL: 17.4 mg/dL (ref 0.0–40.0)

## 2022-05-10 LAB — ALT: ALT: 20 U/L (ref 0–53)

## 2022-05-10 LAB — TESTOSTERONE: Testosterone: 323.41 ng/dL (ref 300.00–890.00)

## 2022-05-10 LAB — AST: AST: 18 U/L (ref 0–37)

## 2022-05-10 LAB — LUTEINIZING HORMONE: LH: 6.58 m[IU]/mL (ref 3.10–34.60)

## 2022-05-10 LAB — TSH: TSH: 1.3 u[IU]/mL (ref 0.35–5.50)

## 2022-05-10 MED ORDER — SHINGRIX 50 MCG/0.5ML IM SUSR: 0.5000 mL | Freq: Once | INTRAMUSCULAR | 1 refills | Status: AC

## 2022-05-10 NOTE — Progress Notes (Unsigned)
Subjective:    Patient ID: Kyle Strickland, male    DOB: 1946-09-16, 76 y.o.   MRN: [id number]  DOS:  05/10/2022 Type of visit - description: f/u  Routine visit, feeling well. Cardiology notes reviewed Complain of: - Right-sided chest pain, in the same spot, only associated with cough - A sore spot at the left chest, on exam he was found to have gynecomastia.  He denies any injury, discharge. From time to time gait is slightly off balance when he quickly gets up, symptoms last few seconds  Review of Systems Denies exertional chest pain, he is active, goes to the gym without problems No nausea vomiting.  No blood in the stools  Past Medical History:  Diagnosis Date   Elevated blood-pressure reading without diagnosis of hypertension    Other and unspecified hyperlipidemia    Prostate cancer ([place]) 2011   Vitamin D deficiency     Past Surgical History:  Procedure Laterality Date   CORONARY ARTERY BYPASS GRAFT N/A 08/29/2021   Procedure: CORONARY ARTERY BYPASS GRAFTING (CABG)x 4 ON CARDIOPULMONARY BYPASS. LIMA TO LAD, SVG TO OM1, SVG TO PDA-OM2 SEQ.;  Surgeon: Melrose Nakayama, MD;  Location: [place];  Service: Open Heart Surgery;  Laterality: N/A;   ENDOVEIN HARVEST OF GREATER SAPHENOUS VEIN Right 08/29/2021   Procedure: ENDOVEIN HARVEST OF GREATER SAPHENOUS VEIN;  Surgeon: Melrose Nakayama, MD;  Location: [place];  Service: Open Heart Surgery;  Laterality: Right;   LEFT HEART CATH AND CORONARY ANGIOGRAPHY N/A 08/23/2021   Procedure: LEFT HEART CATH AND CORONARY ANGIOGRAPHY;  Surgeon: Jettie Booze, MD;  Location: [facility];  Service: Cardiovascular;  Laterality: N/A;   PROSTATECTOMY  2011   Dr Darcus Austin, [facility]   TEE WITHOUT CARDIOVERSION N/A 08/29/2021   Procedure: TRANSESOPHAGEAL ECHOCARDIOGRAM (TEE);  Surgeon: Melrose Nakayama, MD;  Location: [place];  Service: Open Heart Surgery;  Laterality: N/A;    Current Outpatient Medications  Medication Instructions    aspirin EC 81 mg, Oral, Every M-T-W-Th-Fr, In the evening.   atorvastatin (LIPITOR) 80 mg, Oral, Daily   Cyanocobalamin (B-12 PO) Oral   Magnesium 500 MG TABS 1 tablet, Oral, Daily   metoprolol succinate (TOPROL-XL) 25 mg, Oral, Daily   Multiple Vitamin (MULTIVITAMINS PO) 1 tablet, Oral, Daily,     Omega-3 Fatty Acids (FISH OIL PO) Oral   Vitamin D3 1,000 Units, Oral, Daily,     Zoster Vaccine Adjuvanted (SHINGRIX) injection 0.5 mLs, Intramuscular,  Once       Objective:   Physical Exam BP 134/64   Pulse (!) 53   Temp 97.8 F (36.6 C) (Oral)   Resp 16   Ht '5\' 11"'$  (1.803 m)   Wt 210 lb 8 oz (95.5 kg)   SpO2 97%   BMI 29.36 kg/m  General:   Well developed, NAD, BMI noted. HEENT:  Normocephalic . Face symmetric, atraumatic Lungs:  CTA B Normal respiratory effort, no intercostal retractions, no accessory muscle use. Chest wall: Normal R nipple Normal L nipple, he does have some breast tissue underneath, approximately 1 in in diameter, not nodular, minimal tenderness. Axillary areas: No LAD Heart: RRR,  no murmur.  Lower extremities: no pretibial edema bilaterally  Skin: Not pale. Not jaundice Neurologic:  alert & oriented X3.  Speech normal, gait appropriate for age and unassisted Psych--  Cognition and judgment appear intact.  Cooperative with normal attention span and concentration.  Behavior appropriate. No anxious or depressed appearing.  Assessment     ASSESSMENT Elevated BP without HTN Hyperlipidemia H/o  prostate cancer: surgery at [facility] last urology visit ~ 2015 Obesity  Vitamin D deficiency Covid infex 10-2020 CV: -CAD:   non-STEMI, status post CABG x4  11 -2- 22.   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  Currently not anticoagulated  PLAN  CAD: Saw cardiology 03/06/2022, felt to be stable, was recommended to continue aspirin, atorvastatin and metoprolol.  Check BMP. High cholesterol: On  atorvastatin, LFTs and FLP today. Postop A-fib: Zio monitor 12/2021, PACs, PVCs, no atrial fibrillation.  Currently not anticoagulated.  Check TSH Gynecomastia: Unilateral gynecomastia, noted few months ago by the patient, slightly tender, no nipple discharge, no lymphadenopathies. Plan: Bilateral mammogram,Total testosterone,  hCG, LH , estradiol. Further advised with results. Chest wall pain: See HPI, only with cough, right-sided, suspect is post CABG.  Recommend observation Preventive care: Recommend vaccines, see AVS (to be done after mammogram) RTC 3 months 1--- Here for CPX Elevated BP: BP looks very good. Hyperlipidemia: On Lipitor 80, cardiology plans to check FLP next month. History prostate cancer: No symptoms, check PSA. CAD, A. 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  Had postop A. fib.  Last seen by cardiology 09/26/2021, they rec to continue with apixaban, aspirin, amiodarone, metoprolol. RTC 6 months

## 2022-05-10 NOTE — Patient Instructions (Addendum)
We are referring you to have a bilateral mammogram.  If you are not contacted about the mammogram in the next 10 days please let them know.   Proceed with the following vaccines (after the mammogram is done) Shingrix (shingles)- see printed prescription Covid booster (bivalent) Flu shot this fall  Check the  blood pressure regularly BP GOAL is between 110/65 and  135/85. If it is consistently higher or lower, let me know      GO TO THE LAB : Get the blood work     [place], [place] back for a checkup in 3 months

## 2022-05-11 DIAGNOSIS — I2510 Atherosclerotic heart disease of native coronary artery without angina pectoris: Secondary | ICD-10-CM | POA: Insufficient documentation

## 2022-05-11 LAB — ESTRADIOL: Estradiol: 20 pg/mL (ref <=39)

## 2022-05-11 LAB — BETA HCG QUANT (REF LAB): hCG Quant: <1 m[IU]/mL (ref 0–3)

## 2022-05-11 NOTE — Assessment & Plan Note (Signed)
CAD: Saw cardiology 03/06/2022, felt to be stable, was rec  to continue aspirin, atorvastatin and metoprolol.  Check BMP. High cholesterol: On atorvastatin, LFTs and FLP today. Postop A-fib: Zio monitor 12/2021, PACs, PVCs, no atrial fibrillation.  Currently not anticoagulated.  Check TSH Gynecomastia: Unilateral gynecomastia, noted few months ago by the patient, slightly tender, no nipple discharge, no lymphadenopathies. Plan: Bilateral mammogram,Total testosterone,  hCG, LH , estradiol. Further advised with results. Chest wall pain: See HPI, only with cough, right-sided, suspect is post CABG.  Recommend observation Preventive care: Recommend vaccines, see AVS (to be done after mammogram) RTC 3 months

## 2022-05-31 ENCOUNTER — Ambulatory Visit
Admission: RE | Admit: 2022-05-31 | Discharge: 2022-05-31 | Disposition: A | Discharge status: Home / Self Care | Payer: Medicare Other | Source: Ambulatory Visit | Attending: Internal Medicine | Admitting: Internal Medicine

## 2022-05-31 DIAGNOSIS — N644 Mastodynia: Secondary | ICD-10-CM | POA: Diagnosis not present

## 2022-05-31 DIAGNOSIS — N62 Hypertrophy of breast: Secondary | ICD-10-CM | POA: Diagnosis not present

## 2022-06-17 ENCOUNTER — Telehealth: Payer: Medicare Other | Admitting: Physician Assistant

## 2022-06-17 DIAGNOSIS — K115 Sialolithiasis: Secondary | ICD-10-CM

## 2022-06-17 DIAGNOSIS — K1120 Sialoadenitis, unspecified: Secondary | ICD-10-CM

## 2022-06-17 MED ORDER — AMOXICILLIN 500 MG PO CAPS: 500.0000 mg | ORAL_CAPSULE | Freq: Three times a day (TID) | ORAL | 0 refills | Status: AC

## 2022-06-17 MED ORDER — PREDNISONE 20 MG PO TABS: 40.0000 mg | ORAL_TABLET | Freq: Every day | ORAL | 0 refills | Status: DC

## 2022-06-17 NOTE — Progress Notes (Signed)
Virtual Visit Consent   Kyle Strickland, you are scheduled for a virtual visit with a [facility] provider today. Just as with appointments in the office, your consent must be obtained to participate. Your consent will be active for this visit and any virtual visit you may have with one of our providers in the next 365 days. If you have a MyChart account, a copy of this consent can be sent to you electronically.  As this is a virtual visit, video technology does not allow for your provider to perform a traditional examination. This may limit your provider's ability to fully assess your condition. If your provider identifies any concerns that need to be evaluated in person or the need to arrange testing (such as labs, EKG, etc.), we will make arrangements to do so. Although advances in technology are sophisticated, we cannot ensure that it will always work on either your end or our end. If the connection with a video visit is poor, the visit may have to be switched to a telephone visit. With either a video or telephone visit, we are not always able to ensure that we have a secure connection.  By engaging in this virtual visit, you consent to the provision of healthcare and authorize for your insurance to be billed (if applicable) for the services provided during this visit. Depending on your insurance coverage, you may receive a charge related to this service.  I need to obtain your verbal consent now. Are you willing to proceed with your visit today? Kyle Strickland has provided verbal consent on 06/17/2022 for a virtual visit (video or telephone). Mar Daring, PA-C  Date: 06/17/2022 2:13 PM  Virtual Visit via Video Note   I, Mar Daring, connected with  Kyle Strickland  ([id number], 04/09/1946) on 06/17/22 at  2:00 PM EDT by a video-enabled telemedicine application and verified that I am speaking with the correct person using two identifiers.  Location: Patient: Virtual Visit Location  Patient: Home Provider: Virtual Visit Location Provider: Home Office   I discussed the limitations of evaluation and management by telemedicine and the availability of in person appointments. The patient expressed understanding and agreed to proceed.    History of Present Illness: Kyle Strickland is a 76 y.o. who identifies as a male who was assigned male at birth, and is being seen today for swelling under the right jaw line. Reports started noticing last night some. Got better overnight until he ate a Biscotti and coffee this morning he noticed the swelling returning. After a while swelling decreased some. Then he had lunch and swelling returned again. Now, starting to decrease again. Has mild tenderness. Feels a fullness and pulling when gland swells. Denies redness overlying, warmth to area, fevers, chills, nausea, vomiting.    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   NSTEMI (non-ST elevated myocardial infarction) ([place]) 08/23/2021   PCP NOTES >>>>>>>>>> 11/09/2020   Blepharitis of right upper eyelid 03/05/2019   Vitamin D deficiency 07/14/2018   Annual physical exam 12/06/2016   Obesity 12/06/2016   RBBB (right bundle branch block) 05/26/2015   Prostate cancer ([place]) 10/01/2011   Hyperlipidemia 10/01/2011   HEARING LOSS, BILATERAL 01/29/2010   ELEVATED BLOOD PRESSURE WITHOUT DIAGNOSIS OF HYPERTENSION 01/29/2010    Allergies:  Allergies  Allergen  Reactions   Ciprofloxacin     Hives post op   Ditropan [Oxybutynin Chloride]     Hives 2011, post op   Codeine     Nausea and headaches    Medications:  Current Outpatient Medications:    amoxicillin (AMOXIL) 500 MG capsule, Take 1 capsule (500 mg total) by mouth 3 (three) times daily for 10 days., Disp: 30 capsule, Rfl: 0   predniSONE  (DELTASONE) 20 MG tablet, Take 2 tablets (40 mg total) by mouth daily with breakfast., Disp: 10 tablet, Rfl: 0   aspirin EC 81 MG tablet, Take 81 mg by mouth every Monday, Tuesday, Wednesday, Thursday, and Friday. In the evening., Disp: , Rfl:    atorvastatin (LIPITOR) 80 MG tablet, Take 1 tablet (80 mg total) by mouth daily., Disp: 90 tablet, Rfl: 3   Cholecalciferol (VITAMIN D3) 1000 UNITS CAPS, Take 1,000 Units by mouth daily., Disp: , Rfl:    Cyanocobalamin (B-12 PO), Take by mouth., Disp: , Rfl:    Magnesium 500 MG TABS, Take 1 tablet by mouth daily., Disp: , Rfl:    metoprolol succinate (TOPROL-XL) 25 MG 24 hr tablet, Take 1 tablet (25 mg total) by mouth daily., Disp: 90 tablet, Rfl: 3   Multiple Vitamin (MULTIVITAMINS PO), Take 1 tablet by mouth daily., Disp: , Rfl:    Omega-3 Fatty Acids (FISH OIL PO), Take by mouth., Disp: , Rfl:   Observations/Objective: Patient is well-developed, well-nourished in no acute distress.  Resting comfortably at home.  Head is normocephalic, atraumatic.  No labored breathing.  Speech is clear and coherent with logical content.  Patient is alert and oriented at baseline.  Swelling noted in the right submandibular gland, patient able to grab gland. Approx size is 4cm x 3cm  Assessment and Plan: 1. Salivary stone - amoxicillin (AMOXIL) 500 MG capsule; Take 1 capsule (500 mg total) by mouth 3 (three) times daily for 10 days.  Dispense: 30 capsule; Refill: 0 - predniSONE (DELTASONE) 20 MG tablet; Take 2 tablets (40 mg total) by mouth daily with breakfast.  Dispense: 10 tablet; Refill: 0  - High suspicion for salivary stone with waxing and waning swelling with eating - Will give amoxicillin and prednisone for possible infection and inflammation - Discussed sour hard candies and lemons/lemon water - Push fluids - Seek in person evalution if worsening or not improved in 48-72 hours  Follow Up Instructions: I discussed the assessment and treatment plan  with the patient. The patient was provided an opportunity to ask questions and all were answered. The patient agreed with the plan and demonstrated an understanding of the instructions.  A copy of instructions were sent to the patient via MyChart unless otherwise noted below.    The patient was advised to call back or seek an in-person evaluation if the symptoms worsen or if the condition fails to improve as anticipated.  Time:  I spent 15 minutes with the patient via telehealth technology discussing the above problems/concerns.    Mar Daring, PA-C

## 2022-06-17 NOTE — Patient Instructions (Signed)
Kyle Strickland, thank you for joining Mar Daring, PA-C for today's virtual visit.  While this provider is not your primary care provider (PCP), if your PCP is located in our provider database this encounter information will be shared with them immediately following your visit.  Consent: (Patient) Kyle Strickland provided verbal consent for this virtual visit at the beginning of the encounter.  Current Medications:  Current Outpatient Medications:    amoxicillin (AMOXIL) 500 MG capsule, Take 1 capsule (500 mg total) by mouth 3 (three) times daily for 10 days., Disp: 30 capsule, Rfl: 0   predniSONE (DELTASONE) 20 MG tablet, Take 2 tablets (40 mg total) by mouth daily with breakfast., Disp: 10 tablet, Rfl: 0   aspirin EC 81 MG tablet, Take 81 mg by mouth every Monday, Tuesday, Wednesday, Thursday, and Friday. In the evening., Disp: , Rfl:    atorvastatin (LIPITOR) 80 MG tablet, Take 1 tablet (80 mg total) by mouth daily., Disp: 90 tablet, Rfl: 3   Cholecalciferol (VITAMIN D3) 1000 UNITS CAPS, Take 1,000 Units by mouth daily., Disp: , Rfl:    Cyanocobalamin (B-12 PO), Take by mouth., Disp: , Rfl:    Magnesium 500 MG TABS, Take 1 tablet by mouth daily., Disp: , Rfl:    metoprolol succinate (TOPROL-XL) 25 MG 24 hr tablet, Take 1 tablet (25 mg total) by mouth daily., Disp: 90 tablet, Rfl: 3   Multiple Vitamin (MULTIVITAMINS PO), Take 1 tablet by mouth daily., Disp: , Rfl:    Omega-3 Fatty Acids (FISH OIL PO), Take by mouth., Disp: , Rfl:    Medications ordered in this encounter:  Meds ordered this encounter  Medications   amoxicillin (AMOXIL) 500 MG capsule    Sig: Take 1 capsule (500 mg total) by mouth 3 (three) times daily for 10 days.    Dispense:  30 capsule    Refill:  0    Order Specific Question:   Supervising Provider    Answer:   Sabra Heck, BRIAN [[id number]]   predniSONE (DELTASONE) 20 MG tablet    Sig: Take 2 tablets (40 mg total) by mouth daily with breakfast.    Dispense:  10  tablet    Refill:  0    Order Specific Question:   Supervising Provider    Answer:   Sabra Heck, BRIAN [[id number]]     *If you need refills on other medications prior to your next appointment, please contact your pharmacy*  Follow-Up: Call back or seek an in-person evaluation if the symptoms worsen or if the condition fails to improve as anticipated.  Other Instructions  Salivary Stone  A salivary stone is a small cluster of mineral (mineral deposit) that builds up in the tubes (ducts) that drain the salivary glands. Most salivary stones are made of calcium. When a stone forms, saliva can back up into the gland and cause painful swelling. Your salivary glands are the glands that make saliva. You have six major salivary glands. Each gland has a duct that carries saliva into your mouth. Saliva keeps your mouth moist and breaks down the food that you eat. It also helps prevent tooth decay. Two salivary glands are found just in front of your ears (parotid). The ducts for these glands open up inside your cheeks, near your back teeth. You also have two glands under your tongue (sublingual) and two glands under your jaw (submandibular). The ducts for these glands open under your tongue. A stone can form in any salivary gland. The most common  place for a salivary stone to form is in a submandibular salivary gland. What are the causes? Salivary stones may be caused by any condition that lessens the flow of saliva. It is not known why some people get stones. What increases the risk? You are more likely to develop this condition if: You do not drink enough water. You smoke. You have any of these: High blood pressure. Gout. Diabetes. What are the signs or symptoms? The main sign of a salivary stone is sudden swelling of a salivary gland during eating. This usually happens under the jaw on one side. Other signs and symptoms may include: Swelling of the cheek or under the tongue during eating. Pain in the  swollen area. Trouble chewing or swallowing. Swelling that goes down after eating. Sometimes, the salivary stone may be seen. The stone is oval in shape and may be white or yellow in color. How is this diagnosed? This condition may be diagnosed based on: Your signs and symptoms. A physical exam. In many cases, your health care provider will be able to feel the stone in a duct inside your mouth. Imaging studies, such as: X-rays. Ultrasound. CT scan. MRI. You may need to see an ear, nose, and throat specialist (ENT or otolaryngologist) for diagnosis and treatment. How is this treated? Treatment for this condition depends on the size of the stone. A small stone that is not causing symptoms may be treated with home care. A stone that is large enough to cause symptoms may be treated by: Probing and widening of the duct to let the stone pass. Putting a thin, flexible scope (endoscope) into the duct to find and remove the stone. Breaking up the stone with sound waves. Removing the whole salivary gland. Follow these instructions at home: To relieve discomfort Take NSAIDs, such as ibuprofen, to help relieve pain and swelling as told by your health care provider. Follow these instructions every few hours: Suck on a lemon candy or a vitamin C lozenge to prompt the flow of saliva. Put a warm, damp cloth (warmcompress) over the gland. Gently massage the gland. General instructions  Take over-the-counter and prescription medicines only as told by your health care provider. Drink enough fluid to keep your urine pale yellow. Do not use any products that contain nicotine or tobacco. These products include cigarettes, chewing tobacco, and vaping devices, such as e-cigarettes. If you need help quitting, ask your health care provider. Keep all follow-up visits. This is important. Contact a health care provider if: You have pain and swelling in your face, jaw, or mouth after eating. You keep having  swelling in any of these places: In front of your ear. Under your jaw. Inside your mouth. Get help right away if: You have pain and swelling in your face, jaw, or mouth, that suddenly gets worse. Your pain and swelling make it hard to swallow, talk, or breathe. These symptoms may be an emergency. Get help right away. Call 911. Do not wait to see if the symptoms will go away. Do not drive yourself to the hospital. Summary A salivary stone is a small clump of mineral (mineral deposit) that builds up in the ducts that drain your salivary glands. When a stone forms, saliva can back up into the gland and cause painful swelling. Salivary stones may be caused by any condition that lessens the flow of saliva. Treatment for this condition depends on the size of the stone. This information is not intended to replace advice given to  you by your health care provider. Make sure you discuss any questions you have with your health care provider. Document Revised: 10/10/2021 Document Reviewed: 10/10/2021 Elsevier Patient Education  [employer].   Salivary Gland Infection  A salivary gland infection is an infection in one or more of the glands that make saliva. There are six major salivary glands. Each gland has a tube (duct) that carries saliva into the mouth. Saliva keeps the mouth moist and breaks down the food that a person eats. It also helps prevent tooth decay. You have two salivary glands just in front of your ears (parotid). The ducts for these glands open up inside your cheeks, near your back teeth. You also have two glands under your tongue (sublingual) and two glands under your jaw (submandibular). The ducts for these glands open under your tongue. Any salivary gland can get infected. Most infections occur in the parotid glands or submandibular glands. What are the causes? This condition may be caused by bacteria or viruses. The bacteria that cause salivary gland infections are usually  the same bacteria that normally live in your mouth. A stone can form in a salivary gland and block the flow of saliva. Bacteria may then start to grow behind the blockage and cause infection. Bacterial infections usually cause pain and swelling on one side of the face. Submandibular gland swelling occurs under the jaw. Parotid swelling occurs in front of the ear. Bacterial infections are more common in adults. The most common cause of viral salivary gland infections is the mumps virus. However, vaccination has made mumps rare. This infection causes swelling in both parotid glands. Viral infections are more common in children. What increases the risk? You are more likely to develop a bacterial infection if: You do not take good care of your mouth and teeth (have poor oral hygiene). You smoke. You do not drink enough water. You have a disease that causes dry mouth and dry eyes (Mikulicz syndrome or Sjgren syndrome). A viral infection is more likely to occur in children who do not get the measles, mumps, and rubella (MMR) vaccine. What are the signs or symptoms? The main sign of a salivary gland infection is sialadenitis, which is swelling in a salivary gland. You may have swelling in front of your ear, under your jaw, or under your tongue. Swelling may get worse when you eat and decrease after you eat. Other signs and symptoms include: Pain. Tenderness. Redness. Dry mouth. Bad taste in your mouth. Trouble chewing and swallowing. Fever. How is this diagnosed? This condition may be diagnosed based on: Your signs and symptoms. A physical exam. During the exam, your health care provider will look and feel inside your mouth to see whether a stone is blocking a salivary gland duct. Tests, such as: An X-ray to check for a stone. An ultrasound, CT scan, or MRI to look for an infected gland (abscess) and to rule out other causes of swelling. A culture and sensitivity test. In this test, a sample  of pus is taken from the salivary gland by using a swab or a needle (aspiration). The sample is tested in a lab to determine the type of bacteria involved and which antibiotic medicines will work against it. You may need to see an ear, nose, and throat specialist (ENT or otolaryngologist) for diagnosis and treatment. How is this treated? Viral salivary gland infections usually clear up without treatment. Bacterial infections are usually treated with antibiotic medicine. Severe infections that cause trouble swallowing  may be treated with an IV antibiotic in the hospital. Other treatments may include: Probing and widening the salivary duct to let a stone pass. In some cases, a thin, flexible scope (endoscope) may be put into the duct to find a stone and remove it. Breaking up a stone using sound waves. Draining an abscess with a needle. Surgery to: Remove a stone. Drain pus from an abscess. Remove a gland that has a bad or long-term infection. Follow these instructions at home: Medicines Take over-the-counter and prescription medicines only as told by your health care provider. If you were prescribed an antibiotic medicine, take it as told by your health care provider. Do not stop taking the antibiotic even if you start to feel better. To relieve discomfort Follow these instructions every few hours: Suck on a lemon candy or a vitamin C lozenge to prompt the flow of saliva. Put a warm, wet cloth (warmcompress) over the gland. Gently massage the gland. Gargle with a mixture of salt and water 3-4 times a day or as needed. To make salt water, completely dissolve -1 tsp (3-6 g) of salt in 1 cup (237 mL) of warm water. General instructions  Practice good oral hygiene by brushing and flossing your teeth after meals and before you go to bed. Drink enough fluid to keep your urine pale yellow. Do not use any products that contain nicotine or tobacco. These products include cigarettes, chewing tobacco,  and vaping devices, such as e-cigarettes. If you need help quitting, ask your health care provider. Keep all follow-up visits. This is important. Contact a health care provider if: You have pain and swelling in your face, jaw, or mouth after eating. You keep having swelling in any of these places: In front of your ear. Under your jaw. Inside your mouth. Get help right away if: You have pain and swelling in your face, jaw, or mouth that suddenly gets worse. Your pain and swelling make it hard to swallow, talk, or breathe. These symptoms may be an emergency. Get help right away. Call 911. Do not wait to see if the symptoms will go away. Do not drive yourself to the hospital. Summary A salivary gland infection is an infection in one or more of the glands that make saliva. Any salivary gland can get infected. This condition may be caused by bacteria or viruses. Salivary gland infections caused by a virus usually clear up without treatment. Bacterial infections are usually treated with antibiotic medicine. This information is not intended to replace advice given to you by your health care provider. Make sure you discuss any questions you have with your health care provider. Document Revised: 10/10/2021 Document Reviewed: 10/10/2021 Elsevier Patient Education  [place].    If you have been instructed to have an in-person evaluation today at a local Urgent Care facility, please use the link below. It will take you to a list of all of our available [facility], including address, phone number and hours of operation. Please do not delay care.  [facility]  If you or a family member do not have a primary care provider, use the link below to schedule a visit and establish care. When you choose a [facility] primary care physician or advanced practice provider, you gain a long-term partner in health. Find a Primary Care Provider  Learn more about [facility]'s  in-office and virtual care options: [facility]

## 2022-06-20 ENCOUNTER — Ambulatory Visit: Payer: Medicare Other | Admitting: Family Medicine

## 2022-06-30 IMAGING — DX DG CHEST 1V PORT
1 series · 1 of 1 positions shown · non-contrast
Comparison: 08/23/2021

CLINICAL DATA: Status post CABG

EXAM:
PORTABLE CHEST 1 VIEW

[chest]
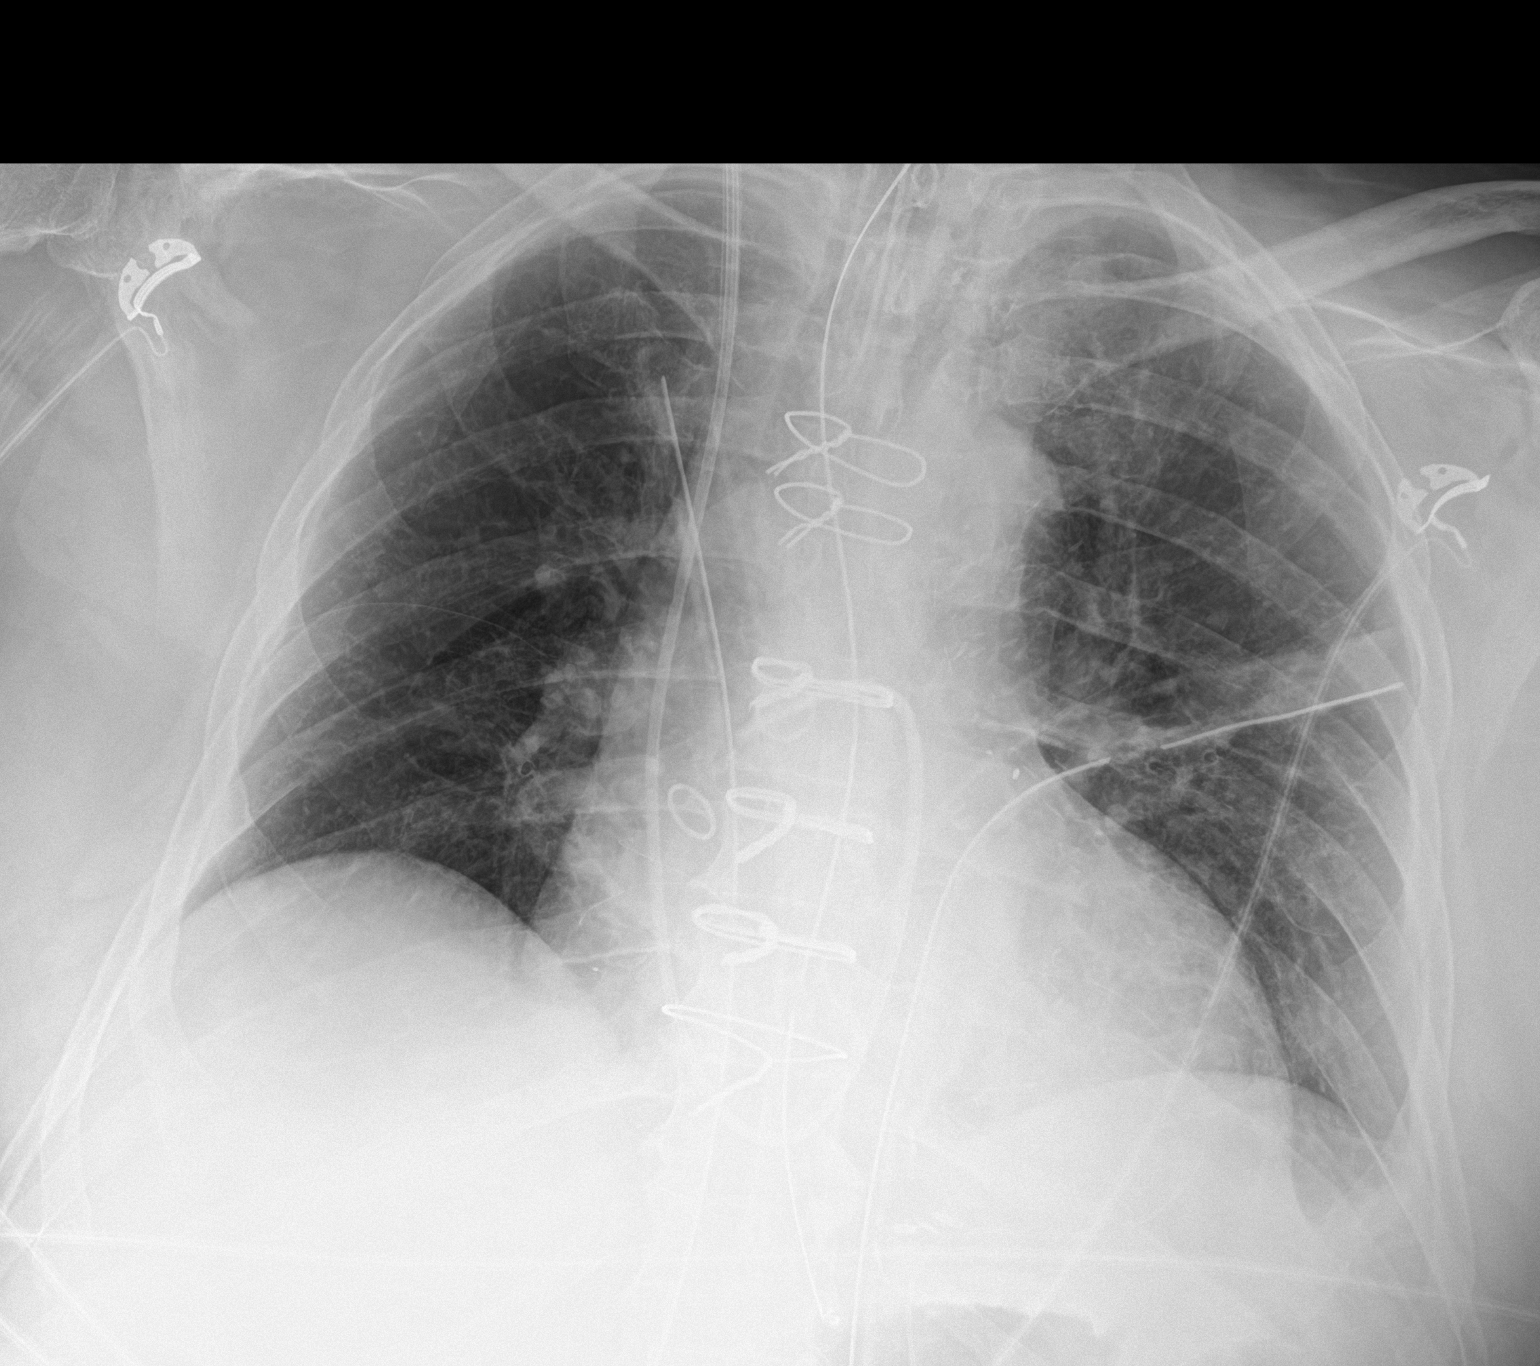

[1 of 1 positions shown; findings below may reference images not displayed]

FINDINGS: Interval midline sternotomy. Endotracheal to position 4.8 cm from
carina. NG tube extends to the GE junction. Mediastinal drains in
place. Swan-Ganz catheter in the main pulmonary artery. LEFT chest
tube in place.

No pneumothorax pulmonary edema. Mild basilar atelectasis on the
LEFT.
IMPRESSION: Support apparatus in appropriate position.

No pneumothorax or pulmonary edema.

## 2022-07-01 IMAGING — DX DG CHEST 1V PORT
1 series · 1 of 1 positions shown · non-contrast
Comparison: 08/29/2021

CLINICAL DATA: Follow-up CABG in chest tube

EXAM:
PORTABLE CHEST 1 VIEW

[chest]
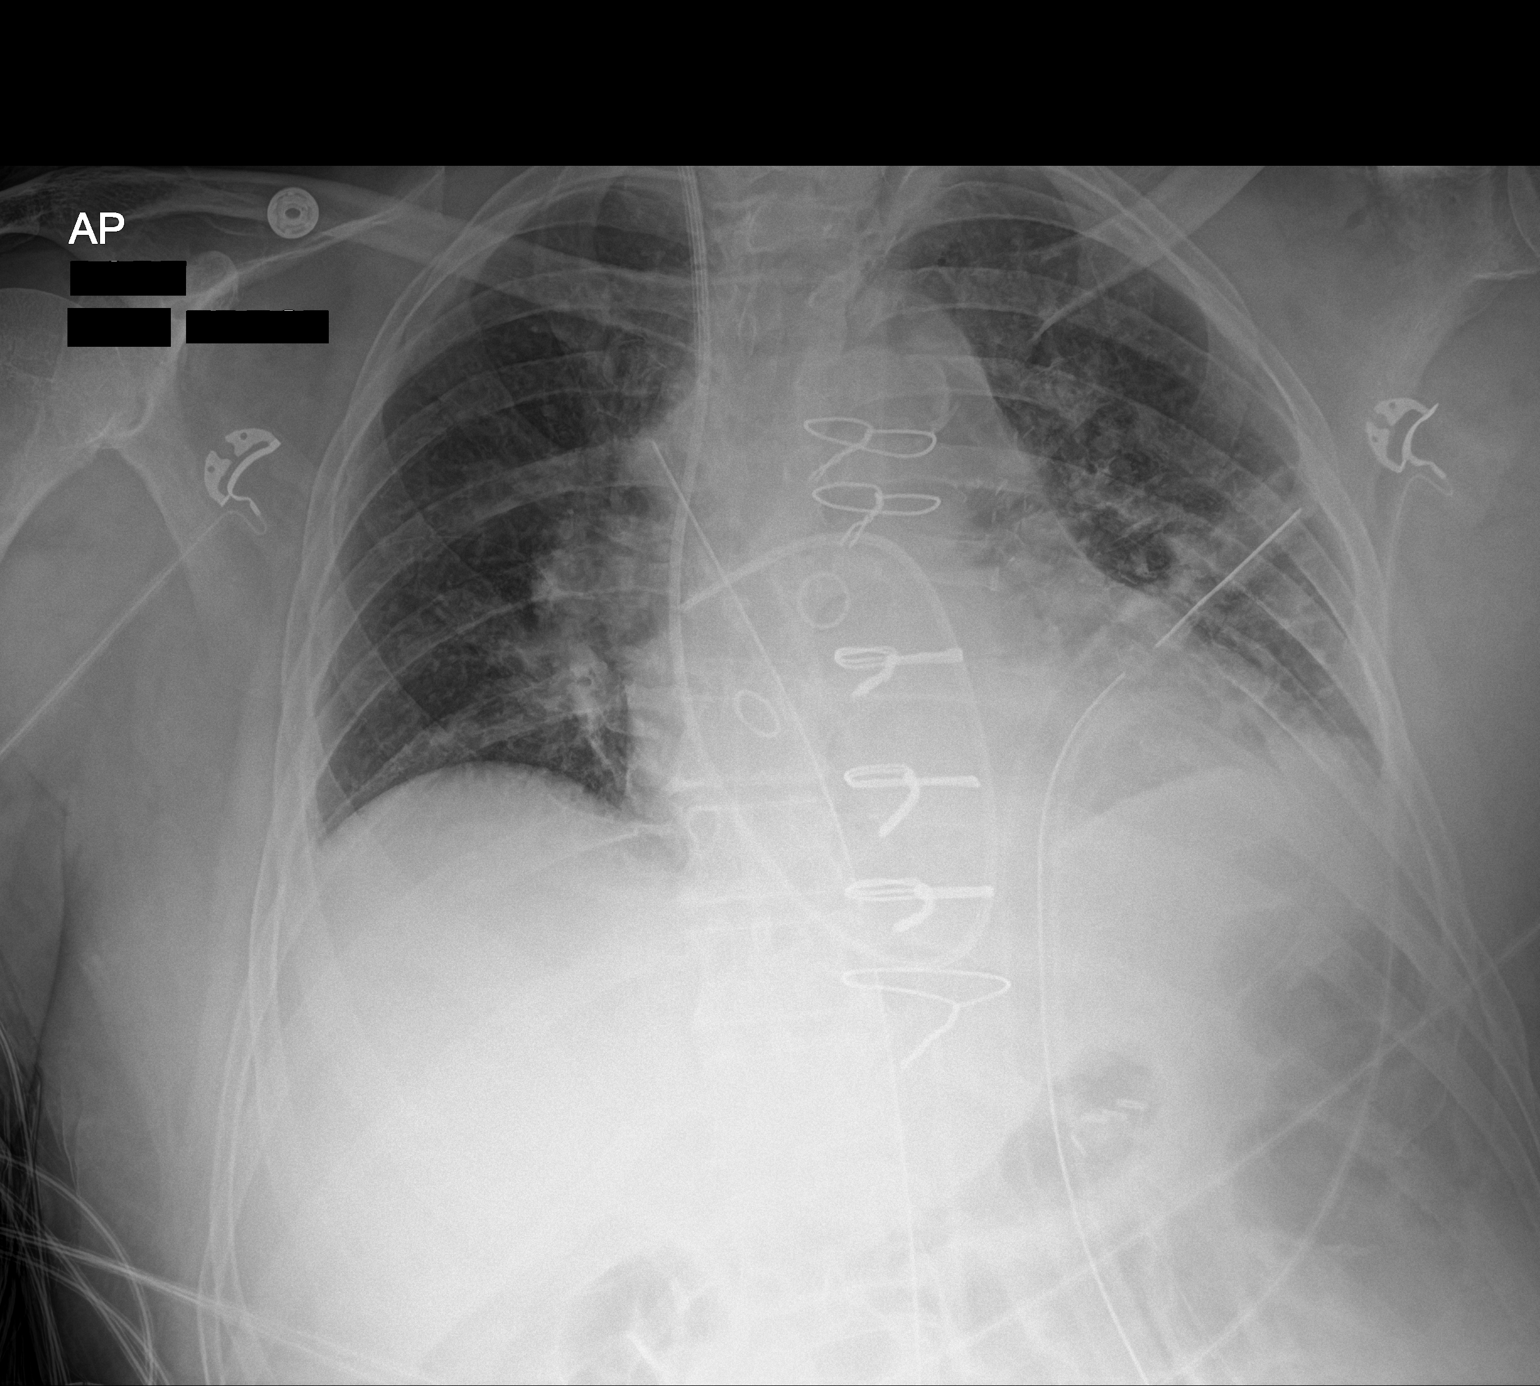

[1 of 1 positions shown; findings below may reference images not displayed]

FINDINGS: Endotracheal tube and nasogastric tube have been removed. Left chest
tube, mediastinal drain and Swan-Ganz catheter remain in place.
Swan-Ganz catheter tip in the right main pulmonary artery. No
pneumothorax. Mild basilar atelectasis on the left.
IMPRESSION: Endotracheal tube and nasogastric tube removed. No pneumothorax.
Mild atelectasis at the left lung base.

## 2022-07-02 IMAGING — DX DG CHEST 1V PORT
1 series · 1 of 1 positions shown · non-contrast
Comparison: Previous studies including the examination of
08/30/2021

CLINICAL DATA: Status post coronary bypass surgery, interval
removal chest tube

EXAM:
PORTABLE CHEST 1 VIEW

[chest ap]
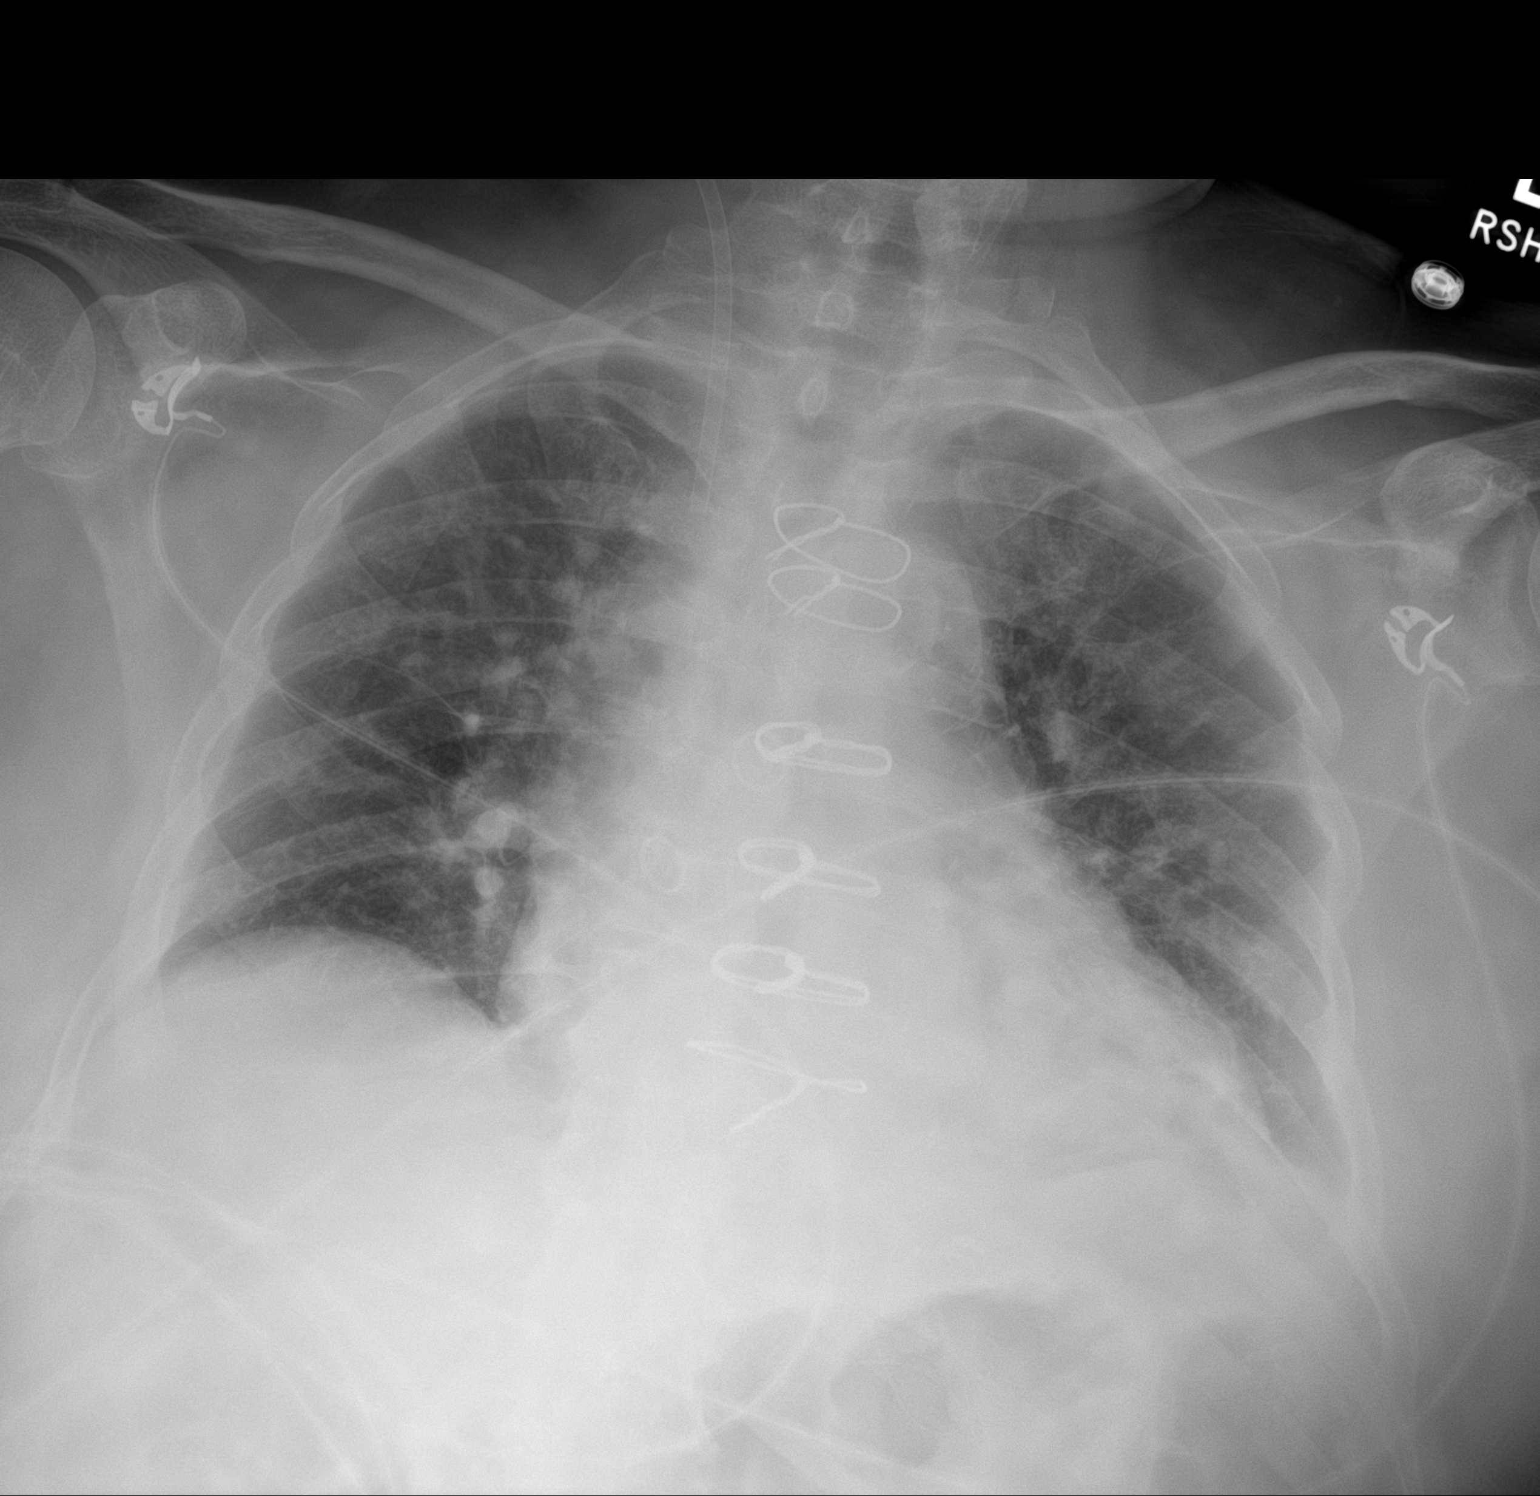

[1 of 1 positions shown; findings below may reference images not displayed]

FINDINGS: Transverse diameter of heart is increased central pulmonary vessels
are prominent without signs of alveolar pulmonary edema. There is
interval removal of left chest tube. There is interval removal of
possible right chest tube or mediastinal drainage catheter. There
are patchy densities in the left lower lung fields. There is
blunting of left lateral CP angle. There is no pneumothorax. There
is interval removal of Swan-Ganz catheter.
IMPRESSION: There is interval removal of chest tube.  There is no pneumothorax.

There are patchy densities in the left lower lung fields suggesting
atelectasis/pneumonitis. Small left pleural effusion.

## 2022-07-03 IMAGING — DX DG CHEST 2V
2 series · 2 of 2 positions shown · non-contrast
Comparison: 08/31/2021

CLINICAL DATA: Postop CABG

EXAM:
CHEST - 2 VIEW

[chest pa]
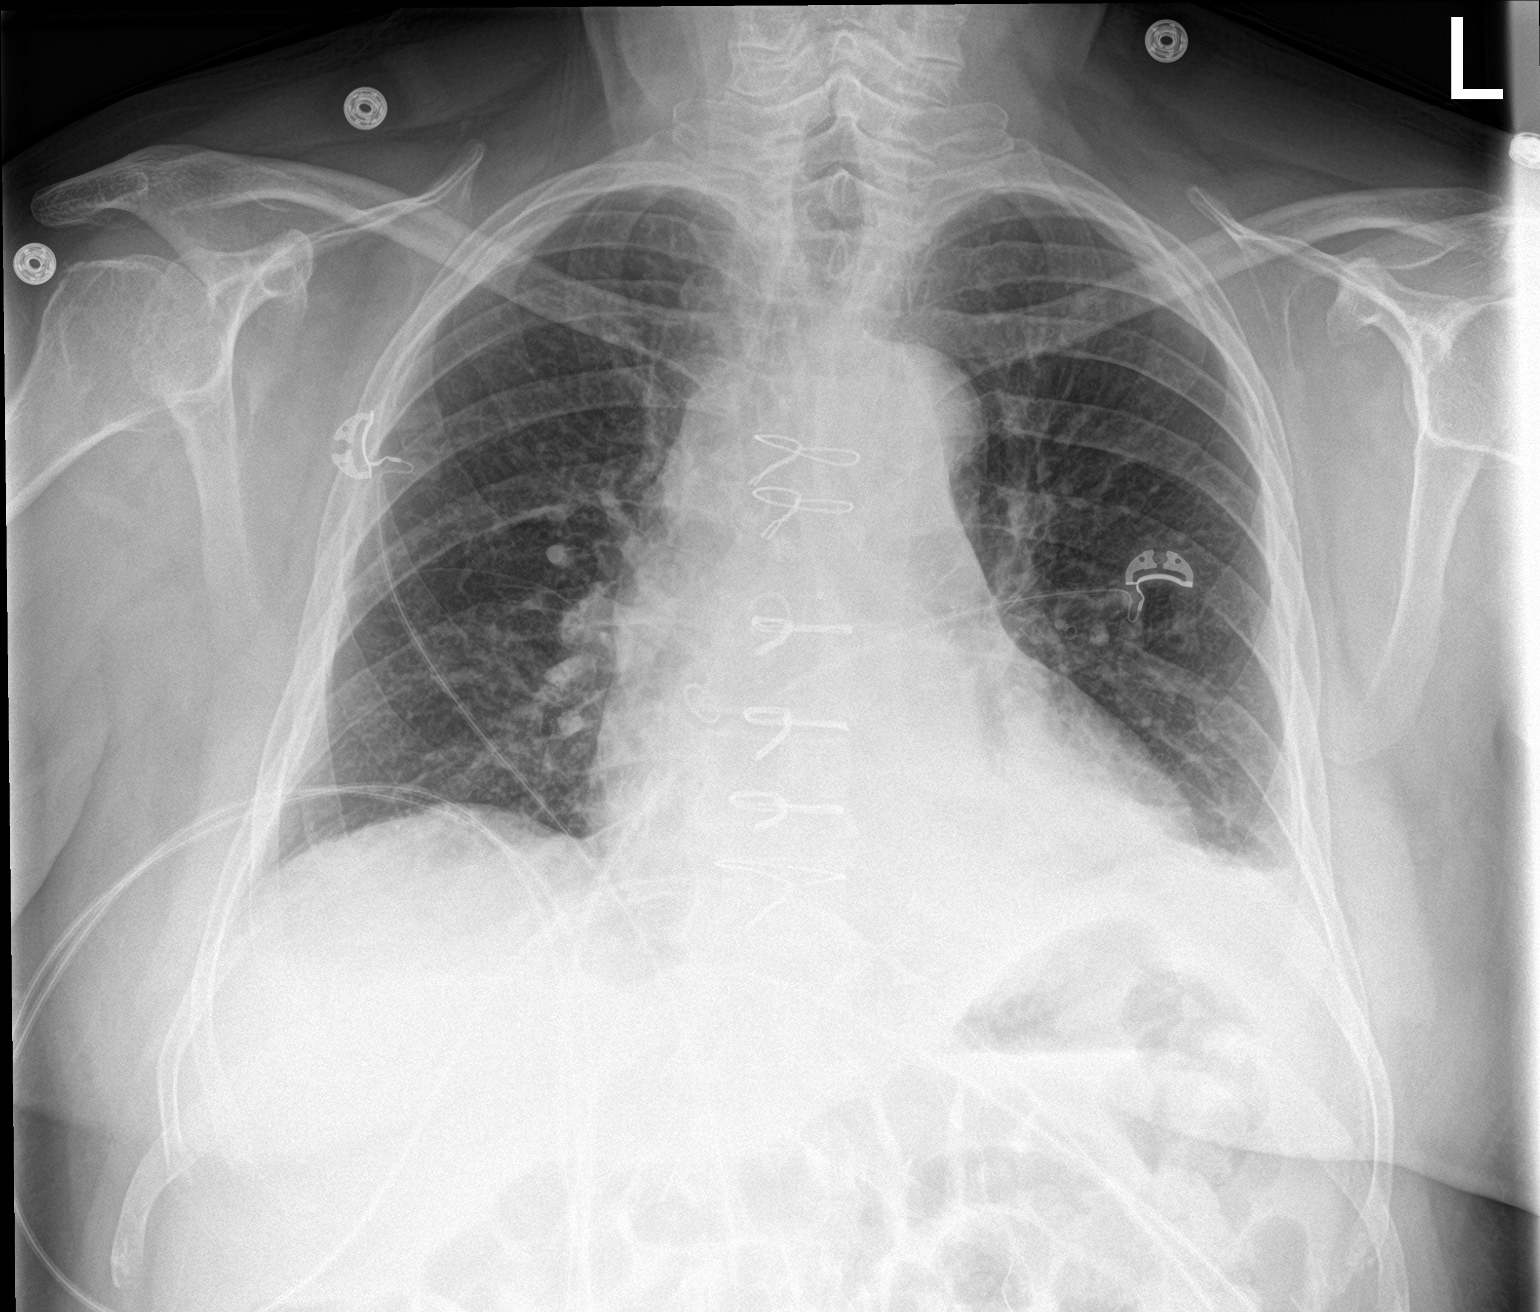

[chest lat]
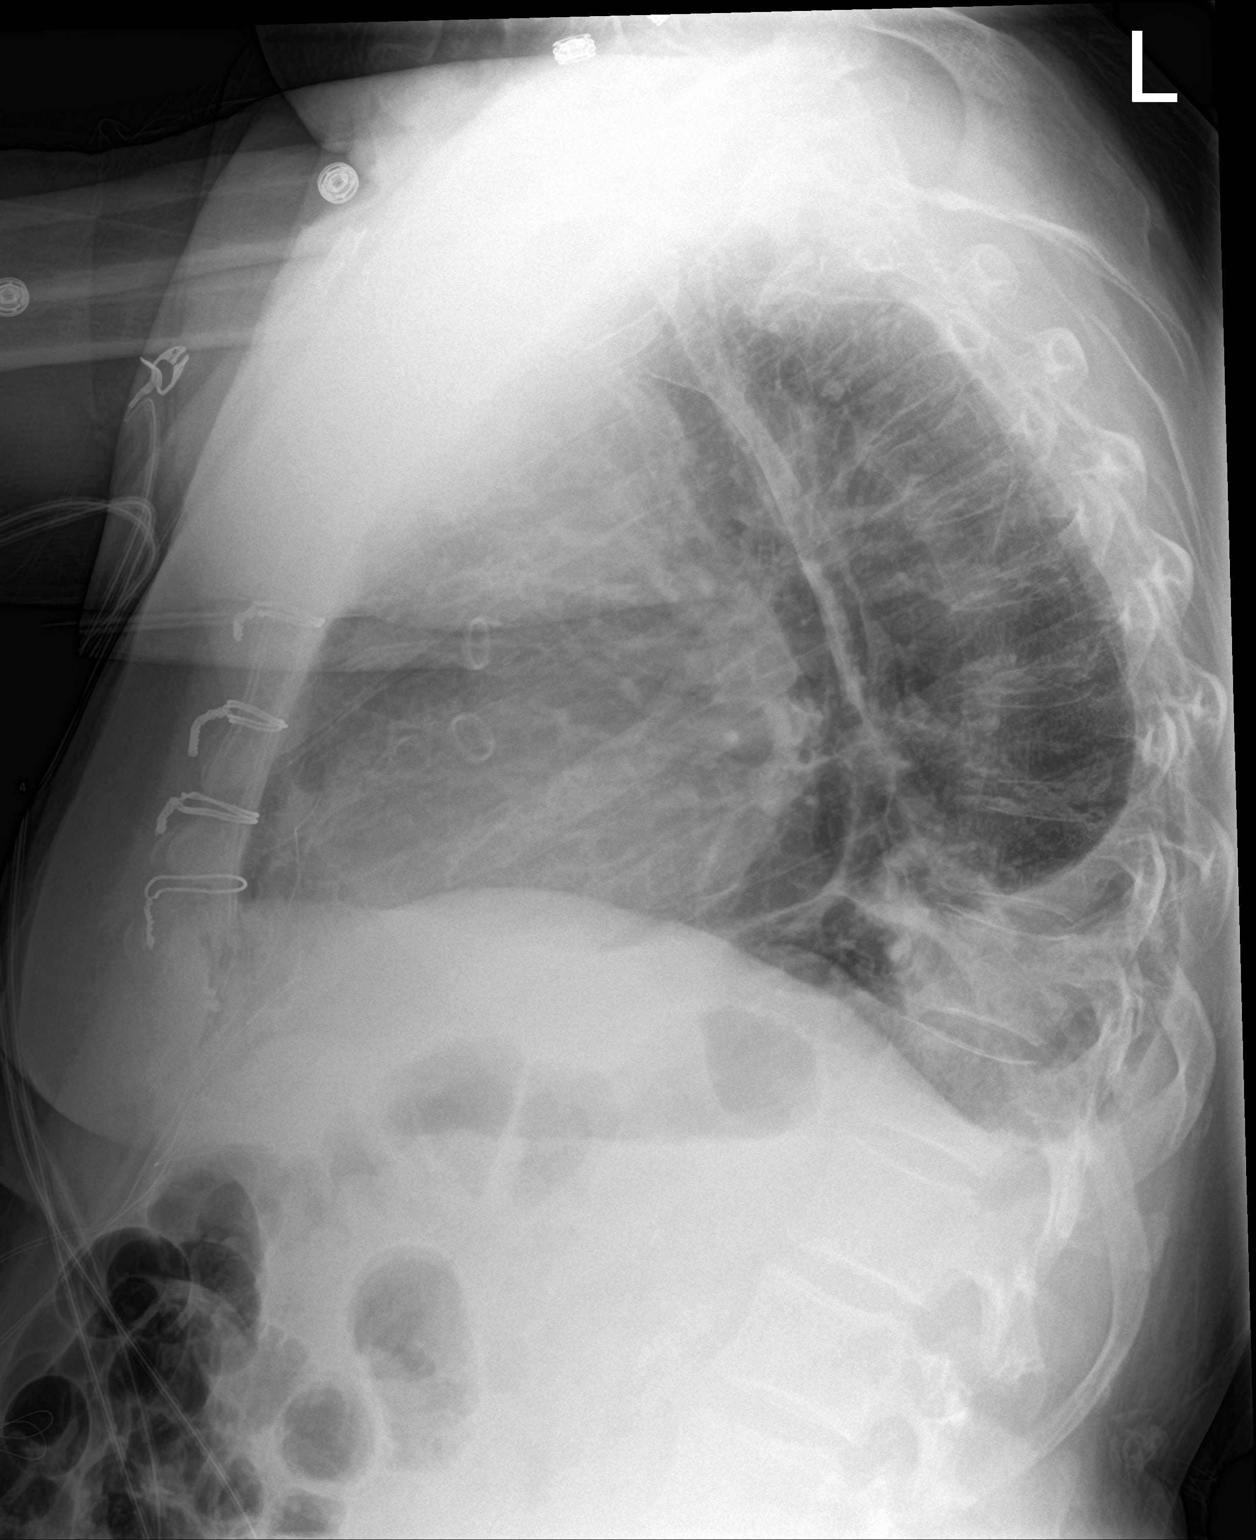

[2 of 2 positions shown; findings below may reference images not displayed]

FINDINGS: Right jugular sheath removed.  No pneumothorax

Improvement in vascular congestion. Improvement in left lower lobe
atelectasis. Small left effusion.
IMPRESSION: Improved vascular congestion.  Improved aeration left lung base.

## 2022-07-23 ENCOUNTER — Ambulatory Visit (INDEPENDENT_AMBULATORY_CARE_PROVIDER_SITE_OTHER): Payer: Medicare Other | Admitting: Internal Medicine

## 2022-07-23 ENCOUNTER — Encounter: Payer: Self-pay | Admitting: Internal Medicine

## 2022-07-23 VITALS — BP 108/62 | HR 63 | Temp 98.2°F | Resp 18 | Ht 71.0 in | Wt 214.5 lb

## 2022-07-23 DIAGNOSIS — N62 Hypertrophy of breast: Secondary | ICD-10-CM

## 2022-07-23 DIAGNOSIS — J069 Acute upper respiratory infection, unspecified: Secondary | ICD-10-CM

## 2022-07-23 DIAGNOSIS — E782 Mixed hyperlipidemia: Secondary | ICD-10-CM | POA: Diagnosis not present

## 2022-07-23 NOTE — Progress Notes (Unsigned)
Subjective:    Patient ID: Kyle Strickland, male    DOB: 06/27/1946, 76 y.o.   MRN: [id number]  DOS:  07/23/2022 Type of visit - description: Acute visit  Developed respiratory symptoms about 2 weeks ago. Never had fever, chest pain or difficulty breathing. Tested for COVID x1: Negative. He is here because he continue with cough although it is gradually better. Initially had yellow-gray sputum, in the last 2-day sputum is clear. He admits to some postnasal drip and throat congestion but denies any chest congestion.  Also, see last visit, had a sore spot on the left chest: That is largely resolved. On exam there was the left breast tissue was a slightly enlarged. Work-up was done and negative.  Review of Systems See above   Past Medical History:  Diagnosis Date   Elevated blood-pressure reading without diagnosis of hypertension    Other and unspecified hyperlipidemia    Prostate cancer ([place]) 2011   Vitamin D deficiency     Past Surgical History:  Procedure Laterality Date   CORONARY ARTERY BYPASS GRAFT N/A 08/29/2021   Procedure: CORONARY ARTERY BYPASS GRAFTING (CABG)x 4 ON CARDIOPULMONARY BYPASS. LIMA TO LAD, SVG TO OM1, SVG TO PDA-OM2 SEQ.;  Surgeon: Melrose Nakayama, MD;  Location: [facility];  Service: Open Heart Surgery;  Laterality: N/A;   ENDOVEIN HARVEST OF GREATER SAPHENOUS VEIN Right 08/29/2021   Procedure: ENDOVEIN HARVEST OF GREATER SAPHENOUS VEIN;  Surgeon: Melrose Nakayama, MD;  Location: [facility];  Service: Open Heart Surgery;  Laterality: Right;   LEFT HEART CATH AND CORONARY ANGIOGRAPHY N/A 08/23/2021   Procedure: LEFT HEART CATH AND CORONARY ANGIOGRAPHY;  Surgeon: Jettie Booze, MD;  Location: [facility];  Service: Cardiovascular;  Laterality: N/A;   PROSTATECTOMY  2011   Dr Darcus Austin, [facility]   TEE WITHOUT CARDIOVERSION N/A 08/29/2021   Procedure: TRANSESOPHAGEAL ECHOCARDIOGRAM (TEE);  Surgeon: Melrose Nakayama, MD;  Location: [facility];  Service:  Open Heart Surgery;  Laterality: N/A;    Current Outpatient Medications  Medication Instructions   aspirin EC 81 mg, Oral, Every M-T-W-Th-Fr, In the evening.   atorvastatin (LIPITOR) 80 mg, Oral, Daily   Cyanocobalamin (B-12 PO) Oral   Magnesium 500 MG TABS 1 tablet, Oral, Daily   metoprolol succinate (TOPROL-XL) 25 mg, Oral, Daily   Multiple Vitamin (MULTIVITAMINS PO) 1 tablet, Oral, Daily,     Omega-3 Fatty Acids (FISH OIL PO) Oral   predniSONE (DELTASONE) 40 mg, Oral, Daily with breakfast   Vitamin D3 1,000 Units, Oral, Daily,         Objective:   Physical Exam BP 108/62   Pulse 63   Temp 98.2 F (36.8 C) (Oral)   Resp 18   Ht '5\' 11"'$  (1.803 m)   Wt 214 lb 8 oz (97.3 kg)   SpO2 96%   BMI 29.92 kg/m  General:   Well developed, NAD, BMI noted. HEENT:  Normocephalic . Face symmetric, atraumatic. TMs: Normal Throat: Symmetric not red Nose slightly congested. Chest wall: Slightly more breast tissue under the left nipple compared to the left.  Nipple itself is normal, no discharge.  No tenderness. Lungs:  CTA B Normal respiratory effort, no intercostal retractions, no accessory muscle use. Heart: RRR,  no murmur.  Lower extremities: no pretibial edema bilaterally  Skin: Not pale. Not jaundice Neurologic:  alert & oriented X3.  Speech normal, gait appropriate for age and unassisted Psych--  Cognition and judgment appear intact.  Cooperative with normal attention  span and concentration.  Behavior appropriate. No anxious or depressed appearing.      Assessment     ASSESSMENT Elevated BP without HTN Hyperlipidemia H/o  prostate cancer: surgery at [facility] last urology visit ~ 2015 Obesity  Vitamin D deficiency Covid infex 10-2020 CV: -CAD:   non-STEMI, status post CABG x4  11 -2- 22.   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  Currently not anticoagulated  PLAN URI: Symptoms started 2 weeks ago, currently with mild  cough.  Exam is benign.  Recommend to continue Robitussin-DM, Astepro, lots of fluids.  Call if not back to normal in 7 to 10 days. Gynecomastia: See LOV, blood work came back negative.  Mammogram and L [place] with no worrisome symptoms.  Physical exam today shows benign slightly enlarged left breast tissue.  We agreed on continued self-examination, will call if any of his breast tissue becomes larger, hard or if he has any nipple discharge.  High cholesterol, last FLP very good.  No change Preventive care: Proceed with COVID and flu vaccine next week.  7-14 CAD: Saw cardiology 03/06/2022, felt to be stable, was rec  to continue aspirin, atorvastatin and metoprolol.  Check BMP. High cholesterol: On atorvastatin, LFTs and FLP today. Postop A-fib: Zio monitor 12/2021, PACs, PVCs, no atrial fibrillation.  Currently not anticoagulated.  Check TSH Gynecomastia: Unilateral gynecomastia, noted few months ago by the patient, slightly tender, no nipple discharge, no lymphadenopathies. Plan: Bilateral mammogram,Total testosterone,  hCG, LH , estradiol. Further advised with results. Chest wall pain: See HPI, only with cough, right-sided, suspect is post CABG.  Recommend observation Preventive care: Recommend vaccines, see AVS (to be done after mammogram) RTC 3 months

## 2022-07-23 NOTE — Patient Instructions (Addendum)
Rest  Fluids  Continue Robitussin-DM  Get over-the-counter Astepro: 2 sprays on each side of the nose daily.  Call if you are not back to normal in 7 to 10 days.   Vaccines I recommend:  Covid booster flu shot next week    GO TO THE FRONT DESK, PLEASE SCHEDULE YOUR APPOINTMENTS Come back for   physical exam by 10-2022

## 2022-07-24 NOTE — Assessment & Plan Note (Signed)
URI: Symptoms started 2 weeks ago, currently with mild cough.  Exam is benign.  Recommend to continue Robitussin-DM, Astepro, lots of fluids.  Call if not back to normal in 7 to 10 days. L Gynecomastia: See LOV, blood work came back negative.  Mammogram and (L) [place] with no worrisome findings.  Physical exam today shows benign slightly enlarged left breast tissue.  We agreed on continued self-examination, will call if any of his breast tissue becomes larger, hard or if he has any nipple discharge. High cholesterol, last FLP very good.  No change Preventive care: Proceed with COVID and flu vaccine next week. RTC 10-2022 for CPX

## 2022-08-12 ENCOUNTER — Ambulatory Visit: Payer: Medicare Other | Admitting: Internal Medicine

## 2022-09-16 ENCOUNTER — Ambulatory Visit: Payer: Medicare Other

## 2022-09-17 ENCOUNTER — Ambulatory Visit (INDEPENDENT_AMBULATORY_CARE_PROVIDER_SITE_OTHER): Payer: Medicare Other | Admitting: *Deleted

## 2022-09-17 VITALS — BP 127/70 | HR 52 | Ht 71.0 in | Wt 213.4 lb

## 2022-09-17 DIAGNOSIS — Z23 Encounter for immunization: Secondary | ICD-10-CM | POA: Diagnosis not present

## 2022-09-17 DIAGNOSIS — Z0000 Encounter for general adult medical examination without abnormal findings: Secondary | ICD-10-CM

## 2022-09-17 NOTE — Patient Instructions (Signed)
Kyle Strickland , Thank you for taking time to come for your Medicare Wellness Visit. I appreciate your ongoing commitment to your health goals. Please review the following plan we discussed and let me know if I can assist you in the future.   These are the goals we discussed:  Goals      Patient Stated     Maintain healthy diet        This is a list of the screening recommended for you and due dates:  Health Maintenance  Topic Date Due   COVID-19 Vaccine (4 - 2023-24 season) 06/28/2022   Medicare Annual Wellness Visit  09/18/2023   Pneumonia Vaccine  Completed   Flu Shot  Completed   Hepatitis C Screening: USPSTF Recommendation to screen - Ages 18-79 yo.  Completed   HPV Vaccine  Aged Out   Zoster (Shingles) Vaccine  Discontinued     Next appointment: Follow up in one year for your annual wellness visit.   Preventive Care 39 Years and Older, Male Preventive care refers to lifestyle choices and visits with your health care provider that can promote health and wellness. What does preventive care include? A yearly physical exam. This is also called an annual well check. Dental exams once or twice a year. Routine eye exams. Ask your health care provider how often you should have your eyes checked. Personal lifestyle choices, including: Daily care of your teeth and gums. Regular physical activity. Eating a healthy diet. Avoiding tobacco and drug use. Limiting alcohol use. Practicing safe sex. Taking low doses of aspirin every day. Taking vitamin and mineral supplements as recommended by your health care provider. What happens during an annual well check? The services and screenings done by your health care provider during your annual well check will depend on your age, overall health, lifestyle risk factors, and family history of disease. Counseling  Your health care provider may ask you questions about your: Alcohol use. Tobacco use. Drug use. Emotional well-being. Home and  relationship well-being. Sexual activity. Eating habits. History of falls. Memory and ability to understand (cognition). Work and work Statistician. Screening  You may have the following tests or measurements: Height, weight, and BMI. Blood pressure. Lipid and cholesterol levels. These may be checked every 5 years, or more frequently if you are over 67 years old. Skin check. Lung cancer screening. You may have this screening every year starting at age 33 if you have a 30-pack-year history of smoking and currently smoke or have quit within the past 15 years. Fecal occult blood test (FOBT) of the stool. You may have this test every year starting at age 40. Flexible sigmoidoscopy or colonoscopy. You may have a sigmoidoscopy every 5 years or a colonoscopy every 10 years starting at age 63. Prostate cancer screening. Recommendations will vary depending on your family history and other risks. Hepatitis C blood test. Hepatitis B blood test. Sexually transmitted disease (STD) testing. Diabetes screening. This is done by checking your blood sugar (glucose) after you have not eaten for a while (fasting). You may have this done every 1-3 years. Abdominal aortic aneurysm (AAA) screening. You may need this if you are a current or former smoker. Osteoporosis. You may be screened starting at age 36 if you are at high risk. Talk with your health care provider about your test results, treatment options, and if necessary, the need for more tests. Vaccines  Your health care provider may recommend certain vaccines, such as: Influenza vaccine. This is recommended  every year. Tetanus, diphtheria, and acellular pertussis (Tdap, Td) vaccine. You may need a Td booster every 10 years. Zoster vaccine. You may need this after age 51. Pneumococcal 13-valent conjugate (PCV13) vaccine. One dose is recommended after age 27. Pneumococcal polysaccharide (PPSV23) vaccine. One dose is recommended after age 33. Talk to your  health care provider about which screenings and vaccines you need and how often you need them. This information is not intended to replace advice given to you by your health care provider. Make sure you discuss any questions you have with your health care provider. Document Released: 11/10/2015 Document Revised: 07/03/2016 Document Reviewed: 08/15/2015 Elsevier Interactive Patient Education  2017 [employer] Prevention in the Home Falls can cause injuries. They can happen to people of all ages. There are many things you can do to make your home safe and to help prevent falls. What can I do on the outside of my home? Regularly fix the edges of walkways and driveways and fix any cracks. Remove anything that might make you trip as you walk through a door, such as a raised step or threshold. Trim any bushes or trees on the path to your home. Use bright outdoor lighting. Clear any walking paths of anything that might make someone trip, such as rocks or tools. Regularly check to see if handrails are loose or broken. Make sure that both sides of any steps have handrails. Any raised decks and porches should have guardrails on the edges. Have any leaves, snow, or ice cleared regularly. Use sand or salt on walking paths during winter. Clean up any spills in your garage right away. This includes oil or grease spills. What can I do in the bathroom? Use night lights. Install grab bars by the toilet and in the tub and shower. Do not use towel bars as grab bars. Use non-skid mats or decals in the tub or shower. If you need to sit down in the shower, use a plastic, non-slip stool. Keep the floor dry. Clean up any water that spills on the floor as soon as it happens. Remove soap buildup in the tub or shower regularly. Attach bath mats securely with double-sided non-slip rug tape. Do not have throw rugs and other things on the floor that can make you trip. What can I do in the bedroom? Use night  lights. Make sure that you have a light by your bed that is easy to reach. Do not use any sheets or blankets that are too big for your bed. They should not hang down onto the floor. Have a firm chair that has side arms. You can use this for support while you get dressed. Do not have throw rugs and other things on the floor that can make you trip. What can I do in the kitchen? Clean up any spills right away. Avoid walking on wet floors. Keep items that you use a lot in easy-to-reach places. If you need to reach something above you, use a strong step stool that has a grab bar. Keep electrical cords out of the way. Do not use floor polish or wax that makes floors slippery. If you must use wax, use non-skid floor wax. Do not have throw rugs and other things on the floor that can make you trip. What can I do with my stairs? Do not leave any items on the stairs. Make sure that there are handrails on both sides of the stairs and use them. Fix handrails that are broken  or loose. Make sure that handrails are as long as the stairways. Check any carpeting to make sure that it is firmly attached to the stairs. Fix any carpet that is loose or worn. Avoid having throw rugs at the top or bottom of the stairs. If you do have throw rugs, attach them to the floor with carpet tape. Make sure that you have a light switch at the top of the stairs and the bottom of the stairs. If you do not have them, ask someone to add them for you. What else can I do to help prevent falls? Wear shoes that: Do not have high heels. Have rubber bottoms. Are comfortable and fit you well. Are closed at the toe. Do not wear sandals. If you use a stepladder: Make sure that it is fully opened. Do not climb a closed stepladder. Make sure that both sides of the stepladder are locked into place. Ask someone to hold it for you, if possible. Clearly mark and make sure that you can see: Any grab bars or handrails. First and last  steps. Where the edge of each step is. Use tools that help you move around (mobility aids) if they are needed. These include: Canes. Walkers. Scooters. Crutches. Turn on the lights when you go into a dark area. Replace any light bulbs as soon as they burn out. Set up your furniture so you have a clear path. Avoid moving your furniture around. If any of your floors are uneven, fix them. If there are any pets around you, be aware of where they are. Review your medicines with your doctor. Some medicines can make you feel dizzy. This can increase your chance of falling. Ask your doctor what other things that you can do to help prevent falls. This information is not intended to replace advice given to you by your health care provider. Make sure you discuss any questions you have with your health care provider. Document Released: 08/10/2009 Document Revised: 03/21/2016 Document Reviewed: 11/18/2014 Elsevier Interactive Patient Education  2017 [employer].

## 2022-09-17 NOTE — Progress Notes (Signed)
Subjective:   Kyle Strickland is a 76 y.o. male who presents for Medicare Annual/Subsequent preventive examination.  Review of Systems    Defer to PCP Cardiac Risk Factors include: advanced age (>57mn, >>59women);male gender;dyslipidemia     Objective:    Today's Vitals   09/17/22 0958  BP: 127/70  Pulse: (!) 52  Weight: 213 lb 6.4 oz (96.8 kg)  Height: '5\' 11"'$  (1.803 m)   Body mass index is 29.76 kg/m.     09/17/2022   10:08 AM 09/14/2021    3:09 PM 08/24/2021    9:33 AM 08/23/2021    9:43 AM  Advanced Directives  Does Patient Have a Medical Advance Directive? Yes Yes Yes Yes  Type of AParamed[place]of [place] will [place] will Healthcare Power of [place] Does patient want to make changes to medical advance directive? No - Patient declined  No - Patient declined   Copy of H[place]izavillein Chart? No - copy requested No - copy requested No - copy requested     Current Medications (verified) Outpatient Encounter Medications as of 09/17/2022  Medication Sig   aspirin EC 81 MG tablet Take 81 mg by mouth every Monday, Tuesday, Wednesday, Thursday, and Friday. In the evening.   atorvastatin (LIPITOR) 80 MG tablet Take 1 tablet (80 mg total) by mouth daily.   Cholecalciferol (VITAMIN D3) 1000 UNITS CAPS Take 1,000 Units by mouth daily.   Cyanocobalamin (B-12 PO) Take by mouth.   Magnesium 500 MG TABS Take 1 tablet by mouth daily.   metoprolol succinate (TOPROL-XL) 25 MG 24 hr tablet Take 1 tablet (25 mg total) by mouth daily.   Multiple Vitamin (MULTIVITAMINS PO) Take 1 tablet by mouth daily.   Omega-3 Fatty Acids (FISH OIL PO) Take by mouth.   [DISCONTINUED] predniSONE (DELTASONE) 20 MG tablet Take 2 tablets (40 mg total) by mouth daily with breakfast. (Patient not taking: Reported on 07/23/2022)   No facility-administered encounter medications on file as of 09/17/2022.     Allergies (verified) Ciprofloxacin, Ditropan [oxybutynin chloride], and Codeine   History: Past Medical History:  Diagnosis Date   Elevated blood-pressure reading without diagnosis of hypertension    Other and unspecified hyperlipidemia    [place]state cancer ([place]   Vitamin D deficiency    Past Surgical History:  Procedure Laterality Date   CORONARY ARTERY BYPASS GRAFT N/A 08/29/2021   Procedure: CORONARY ARTERY BYPASS GRAFTING (CABG)x 4 ON CARDIOPULMONARY BYPASS. LIMA TO LAD, SVG TO OM1, SVG TO PDA-OM2 SEQ.;  Surgeon: HMelrose Nakayama MD;  [facility]  Service: Open Heart Surgery;  Laterality: N/A;   ENDOVEIN HARVEST OF GREATER SAPHENOUS VEIN Right 08/29/2021   Procedure: ENDOVEIN HARVEST OF GREATER SAPHENOUS VEIN;  Surgeon: HMelrose Nakayama MD;  [facility]  Service: Open Heart Surgery;  Laterality: Right;   LEFT HEART CATH AND CORONARY ANGIOGRAPHY N/A 08/23/2021   Procedure: LEFT HEART CATH AND CORONARY ANGIOGRAPHY;  Surgeon: VJettie Booze MD;  [facility];  Service: Cardiovascular;  Laterality: N/A;   PROSTATECT[facility]Y  2011   [facility]  TEE WITHOUT CARDIOVERSION N/A 08/29/2021   Procedure: TRANSESOPHAGEAL ECHOCARDIOGRAM (TEE);  Surgeon: HMelrose Nakayama MD;  [facility] [facility]  Service: Open Heart Surgery;  Laterality: N/A;   Family History  Problem Relation Age of Onset   Cancer Maternal Grandfather        bladder  Diabetes Paternal Grandfather        TIAs; CVA   Stroke Paternal Grandfather        early 30s   Heart attack Mother 95   Transient ischemic attack Paternal Uncle    Dementia Father        CVAs   Diabetes Father        borderline   Colon cancer Neg Hx    Social History   Socioeconomic History   Marital status: Married    Spouse name: Not on file   Number of children: 2   Years of education: 14   Highest education level: Not on file  Occupational History   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    Tobacco Use   Smoking status: Never   Smokeless tobacco: Never  Substance and Sexual Activity   Alcohol use: Not Currently    Comment: Rarely   Drug use: No   Sexual activity: Not on file  Other Topics Concern   Not on file  Social History Narrative   Fun: Biomedical scientist, golf    Social Determinants of Health   Financial Resource Strain: Low Risk  (09/14/2021)   Overall Financial Resource Strain (CARDIA)    Difficulty of Paying Living Expenses: Not hard at all  Food Insecurity: No Food Insecurity (09/17/2022)   Hunger Vital Sign    Worried About Running Out of Food in the Last Year: Never true    Ran Out of Food in the Last Year: Never true  Transportation Needs: No Transportation Needs (09/17/2022)   PRAPARE - Hydrologist (Medical): No    Lack of Transportation (Non-Medical): No  Physical Activity: Inactive (09/14/2021)   Exercise Vital Sign    Days of Exercise per Week: 0 days    Minutes of Exercise per Session: 0 min  Stress: No Stress Concern Present (09/14/2021)   [place]    Feeling of Stress : Not at all  Social Connections: [place] (09/14/2021)   Social Connection and Isolation Panel [NHANES]    Frequency of Communication with Friends and Family: More than three times a week    Frequency of Social Gatherings with Friends and Family: More than three times a week    Attends Religious Services: More than 4 times per year    Active Member of [employer] or Organizations: Yes    Attends Music therapist: More than 4 times per year    Marital Status: Married    Tobacco Counseling Counseling given: Not Answered   Clinical Intake:  Pre-visit preparation completed: Yes  Pain : No/denies pain  Diabetes: No  How often do you need to have someone help you when you read instructions, pamphlets, or other written materials from your doctor or pharmacy?: 1  - Never  Activities of Daily Living    09/17/2022   10:17 AM  In your present state of health, do you have any difficulty performing the following activities:  Hearing? 1  Comment wears hearing aids  Vision? 0  Difficulty concentrating or making decisions? 0  Walking or climbing stairs? 1  Dressing or bathing? 0  Doing errands, shopping? 0  Preparing Food and eating ? N  Using the Toilet? N  In the past six months, have you accidently leaked urine? Y  Do you have problems with loss of bowel control? N  Managing your Medications? N  Managing your Finances? N  Housekeeping or managing  your Housekeeping? N    Patient Care Team: Colon Branch, MD as PCP - General (Internal Medicine) Stanford Breed Denice Bors, MD as PCP - Cardiology (Cardiology)  Indicate any recent Medical Services you may have received from other than [facility] providers in the past year (date may be approximate).     Assessment:   This is a routine wellness examination for Kyle Strickland.  Hearing/Vision screen No results found.  Dietary issues and exercise activities discussed: Current Exercise Habits: Home exercise routine, Type of exercise: strength training/weights;Other - see comments;treadmill (stair climber), Time (Minutes): 45, Frequency (Times/Week): 1, Weekly Exercise (Minutes/Week): 45, Intensity: Mild, Exercise limited by: None identified   Goals Addressed             This Visit's Progress    Patient Stated   On track    Maintain healthy diet       Depression Screen    09/17/2022   10:02 AM 07/23/2022    1:57 PM 05/10/2022    8:41 AM 12/21/2021   11:48 AM 11/06/2021   10:00 AM 10/23/2021    8:29 AM 09/14/2021    3:13 PM  PHQ 2/9 Scores  PHQ - 2 Score 0 0 0 0 0 1 0  PHQ- 9 Score      2     Fall Risk    09/17/2022   10:15 AM 07/23/2022    1:57 PM 05/10/2022    8:41 AM 11/06/2021    9:59 AM 10/23/2021    9:01 AM  Fall Risk   Falls in the past year? 1 0 0 0 0  Comment tripped over his dog in the  dark      Number falls in past yr: 0 0 0 0   Injury with Fall? 0 0 0 0   Risk for fall due to : No Fall Risks    Other (Comment)  Risk for fall due to: Comment     Single leg stand less than 5 seconds, some balance concerns.  Follow up Falls evaluation completed Falls evaluation completed Falls evaluation completed Falls evaluation completed Falls evaluation completed    [place]:  Any stairs in or around the home? No  If so, are there any without handrails? No  Home free of loose throw rugs in walkways, pet beds, electrical cords, etc? No  Adequate lighting in your home to reduce risk of falls? No   ASSISTIVE DEVICES UTILIZED TO PREVENT FALLS:  Life alert? No  Use of a cane, walker or w/c? No  Grab bars in the bathroom? No  Shower chair or bench in shower? No  Elevated toilet seat or a handicapped toilet? Yes   TIMED UP AND GO:  Was the test performed? Yes .  Length of time to ambulate 10 feet: 5 sec.   Gait steady and fast without use of assistive device  Cognitive Function:        09/17/2022   10:22 AM  6CIT Screen  What Year? 0 points  What month? 0 points  What time? 0 points  Count back from 20 0 points  Months in reverse 0 points  Repeat phrase 0 points  Total Score 0 points    Immunizations Immunization History  Administered Date(s) Administered   Fluad Quad(high Dose 65+) 10/23/2020, 11/06/2021   Influenza Inj Mdck Quad Pf 08/15/2018   Influenza, High Dose Seasonal PF 10/10/2017   PFIZER(Purple Top)SARS-COV-2 Vaccination 11/22/2019, 12/13/2019, 11/03/2020   Pneumococcal Conjugate-13 05/26/2015  Pneumococcal Polysaccharide-23 12/06/2016   Tdap 05/04/2020    TDAP status: Up to date  Flu Vaccine status: Completed at today's visit  Pneumococcal vaccine status: Up to date  Covid-19 vaccine status: Information provided on how to obtain vaccines.   Qualifies for Shingles Vaccine? Yes   Zostavax completed No    Shingrix Completed?: No.    Education has been provided regarding the importance of this vaccine. Patient has been advised to call insurance company to determine out of pocket expense if they have not yet received this vaccine. Advised may also receive vaccine at local pharmacy or Health Dept. Verbalized acceptance and understanding.  Screening Tests Health Maintenance  Topic Date Due   COVID-19 Vaccine (4 - 2023-24 season) 06/28/2022   Medicare Annual Wellness (AWV)  09/14/2022   INFLUENZA VACCINE  01/26/2023 (Originally 05/28/2022)   Pneumonia Vaccine 51+ Years old  Completed   Hepatitis C Screening  Completed   HPV VACCINES  Aged Out   Zoster Vaccines- Shingrix  Discontinued    Health Maintenance  Health Maintenance Due  Topic Date Due   COVID-19 Vaccine (4 - 2023-24 season) 06/28/2022   Medicare Annual Wellness (AWV)  09/14/2022    Colorectal cancer screening: Type of screening: FOBT/FIT. Completed 07/11/21. Repeat every 1 years  Lung Cancer Screening: (Low Dose CT Chest recommended if Age 52-80 years, 30 pack-year currently smoking OR have quit w/in 15years.) does not qualify.    Additional Screening:  Hepatitis C Screening: does qualify; Completed 12/06/16  Vision Screening: Recommended annual ophthalmology exams for early detection of glaucoma and other disorders of the eye. Is the patient up to date with their annual eye exam?  Yes  Who is the provider or what is the name of the office in which the patient attends annual eye exams? [facility] If pt is not established with a provider, would they like to be referred to a provider to establish care? No .   Dental Screening: Recommended annual dental exams for proper oral hygiene  Community Resource Referral / Chronic Care Management: CRR required this visit?  No   CCM required this visit?  No      Plan:     I have personally reviewed and noted the following in the patient's chart:   Medical and social history Use of  alcohol, tobacco or illicit drugs  Current medications and supplements including opioid prescriptions. Patient is not currently taking opioid prescriptions. Functional ability and status Nutritional status Physical activity Advanced directives List of other physicians Hospitalizations, surgeries, and ER visits in previous 12 months Vitals Screenings to include cognitive, depression, and falls Referrals and appointments  In addition, I have reviewed and discussed with patient certain preventive protocols, quality metrics, and best practice recommendations. A written personalized care plan for preventive services as well as general preventive health recommendations were provided to patient.     Beatris Ship, [place]   09/17/2022   Nurse Notes: None

## 2022-09-17 NOTE — Addendum Note (Signed)
Addended by: Beatris Ship L on: 09/17/2022 10:43 AM   Modules accepted: Level of Service

## 2022-10-11 DIAGNOSIS — H353132 Nonexudative age-related macular degeneration, bilateral, intermediate dry stage: Secondary | ICD-10-CM | POA: Diagnosis not present

## 2022-10-11 DIAGNOSIS — H2513 Age-related nuclear cataract, bilateral: Secondary | ICD-10-CM | POA: Diagnosis not present

## 2022-10-15 ENCOUNTER — Other Ambulatory Visit: Payer: Self-pay | Admitting: Interventional Cardiology

## 2022-10-17 ENCOUNTER — Telehealth: Payer: Self-pay | Admitting: *Deleted

## 2022-10-17 ENCOUNTER — Ambulatory Visit: Payer: Medicare Other | Admitting: Family Medicine

## 2022-10-17 NOTE — Telephone Encounter (Signed)
Spoke with pt and he stated that he was feeling better.  Appointment cancelled.

## 2022-10-17 NOTE — Telephone Encounter (Signed)
Caller Name [place] Phone Number [phone] Patient Name Kyle Strickland Patient DOB 1945/11/21 Call Type Message Only Information Provided Reason for Call Request to Reschedule Office Appointment Initial Comment Caller has an apt tomorrow at 120pm and he needs to reschedule. Patient request to speak to RN No Additional Comment Office hours provided, caller declined triage. Disp. Time Disposition Final User 10/16/2022 5:31:43 PM General Information Provided Yes [name], [name]

## 2022-11-20 ENCOUNTER — Encounter: Payer: Self-pay | Admitting: Internal Medicine

## 2022-11-20 ENCOUNTER — Telehealth: Payer: Self-pay | Admitting: Internal Medicine

## 2022-11-20 ENCOUNTER — Other Ambulatory Visit (INDEPENDENT_AMBULATORY_CARE_PROVIDER_SITE_OTHER): Payer: Medicare Other

## 2022-11-20 ENCOUNTER — Telehealth (INDEPENDENT_AMBULATORY_CARE_PROVIDER_SITE_OTHER): Payer: Medicare Other | Admitting: Internal Medicine

## 2022-11-20 VITALS — BP 134/61 | HR 61 | Temp 98.3°F | Ht 71.0 in | Wt 207.0 lb

## 2022-11-20 DIAGNOSIS — U071 COVID-19: Secondary | ICD-10-CM

## 2022-11-20 LAB — BASIC METABOLIC PANEL
BUN: 17 mg/dL (ref 6–23)
CO2: 28 mEq/L (ref 19–32)
Calcium: 9 mg/dL (ref 8.4–10.5)
Chloride: 103 mEq/L (ref 96–112)
Creatinine, Ser: 1.02 mg/dL (ref 0.40–1.50)
GFR: 71.05 mL/min (ref >60.00)
Glucose, Bld: 116 mg/dL — ABNORMAL HIGH (ref 70–99)
Potassium: 4.2 mEq/L (ref 3.5–5.1)
Sodium: 137 mEq/L (ref 135–145)

## 2022-11-20 MED ORDER — NIRMATRELVIR/RITONAVIR (PAXLOVID)TABLET: 3.0000 | ORAL_TABLET | Freq: Two times a day (BID) | ORAL | 0 refills | Status: DC

## 2022-11-20 MED ORDER — MOLNUPIRAVIR EUA 200MG CAPSULE: 4.0000 | ORAL_CAPSULE | Freq: Two times a day (BID) | ORAL | 0 refills | Status: AC

## 2022-11-20 NOTE — Progress Notes (Signed)
Spoke w/ Pt- informed that Paxlovid sent to [facility]. Hold atorvastatin for 5 days. Pt verbalized understanding.

## 2022-11-20 NOTE — Progress Notes (Signed)
Subjective:    Patient ID: Kyle Strickland, male    DOB: 02/21/1946, 77 y.o.   MRN: [id number]  DOS:  11/20/2022 Type of visit - description: Virtual Visit via Video Note  I connected with the above patient  by a video enabled telemedicine application and verified that I am speaking with the correct person using two identifiers.   Location of patient: home  Location of provider: office  Persons participating in the virtual visit: patient, provider   I discussed the limitations of evaluation and management by telemedicine and the availability of in person appointments. The patient expressed understanding and agreed to proceed.  Acute Symptoms started yesterday:  Chest congestion, headache, achiness.  He also has fever up to 102.5, temperature decreased with Tylenol, some chills. He tested positive for COVID today at home.  Denies chest pain or difficulty breathing.  No nausea vomiting.  Review of Systems See above   Past Medical History:  Diagnosis Date   Elevated blood-pressure reading without diagnosis of hypertension    Other and unspecified hyperlipidemia    Prostate cancer ([facility]) 2011   Vitamin D deficiency     Past Surgical History:  Procedure Laterality Date   CORONARY ARTERY BYPASS GRAFT N/A 08/29/2021   Procedure: CORONARY ARTERY BYPASS GRAFTING (CABG)x 4 ON CARDIOPULMONARY BYPASS. LIMA TO LAD, SVG TO OM1, SVG TO PDA-OM2 SEQ.;  Surgeon: Melrose Nakayama, MD;  Location: [place];  Service: Open Heart Surgery;  Laterality: N/A;   ENDOVEIN HARVEST OF GREATER SAPHENOUS VEIN Right 08/29/2021   Procedure: ENDOVEIN HARVEST OF GREATER SAPHENOUS VEIN;  Surgeon: Melrose Nakayama, MD;  Location: [place];  Service: Open Heart Surgery;  Laterality: Right;   LEFT HEART CATH AND CORONARY ANGIOGRAPHY N/A 08/23/2021   Procedure: LEFT HEART CATH AND CORONARY ANGIOGRAPHY;  Surgeon: Jettie Booze, MD;  Location: [place] [facility];  Service: Cardiovascular;  Laterality: N/A;    PROSTATECTOMY  2011   Dr Darcus Austin, [facility]   TEE WITHOUT CARDIOVERSION N/A 08/29/2021   Procedure: TRANSESOPHAGEAL ECHOCARDIOGRAM (TEE);  Surgeon: Melrose Nakayama, MD;  Location: [place];  Service: Open Heart Surgery;  Laterality: N/A;    Current Outpatient Medications  Medication Instructions   aspirin EC 81 mg, Oral, Every M-T-W-Th-Fr, In the evening.   atorvastatin (LIPITOR) 80 MG tablet TAKE 1 TABLET(80 MG) BY MOUTH DAILY   Cyanocobalamin (B-12 PO) Oral   Magnesium 500 MG TABS 1 tablet, Oral, Daily   metoprolol succinate (TOPROL-XL) 25 MG 24 hr tablet TAKE 1 TABLET(25 MG) BY MOUTH DAILY   molnupiravir EUA (LAGEVRIO) 800 mg, Oral, 2 times daily   Multiple Vitamin (MULTIVITAMINS PO) 1 tablet, Oral, Daily,     Omega-3 Fatty Acids (FISH OIL PO) Oral   Vitamin D3 1,000 Units, Oral, Daily,         Objective:   Physical Exam BP 134/61   Pulse 61   Temp 98.3 F (36.8 C) (Temporal) Comment: 102.3 before tylenol  Ht '5\' 11"'$  (1.803 m)   Wt 207 lb (93.9 kg)   SpO2 94%   BMI 28.87 kg/m  This is a virtual video visit, looks alert oriented x 3, in no physical or emotional distress.  Speaking in complete sentences, no cough    Assessment     ASSESSMENT Elevated BP without HTN Hyperlipidemia H/o  prostate cancer: surgery at [facility] last urology visit ~ 2015 Obesity  Vitamin D deficiency Covid infex 10-2020 CV: -CAD:   non-STEMI, status post CABG x4  11 -2- 22.   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  Currently not anticoagulated  PLAN COVID-19: Patient is 77 years old, last COVID-vaccine was about a year ago, tested positive for COVID today, symptoms started yesterday.  He is a candidate for Paxlovid. Plan: Stat BMP, follow-up by prescription of Paxlovid if appropriate. Rest, fluids, symptomatic treatment. Will have to hold atorvastatin. Contact precautions for the next 7 days a days. ER if symptoms severe such as chest pain,  difficulty breathing, O2 sat consistently less than 94%. Also recommend a vaccine in about a month.    I discussed the assessment and treatment plan with the patient. The patient was provided an opportunity to ask questions and all were answered. The patient agreed with the plan and demonstrated an understanding of the instructions.   The patient was advised to call back or seek an in-person evaluation if the symptoms worsen or if the condition fails to improve as anticipated.

## 2022-11-20 NOTE — Telephone Encounter (Signed)
Patient called to advise that most pharmacies in the area are out of paxlovid and even if they had it, it would cost $533 which he cannot pay. He would like to know if there is something else available he could take.

## 2022-11-20 NOTE — Assessment & Plan Note (Signed)
COVID-19: Patient is 77 years old, last COVID-vaccine was about a year ago, tested positive for COVID today, symptoms started yesterday.  He is a candidate for Paxlovid. Plan: Stat BMP, follow-up by prescription of Paxlovid if appropriate. Rest, fluids, symptomatic treatment. Will have to hold atorvastatin. Contact precautions for the next 7 days a days. ER if symptoms severe such as chest pain, difficulty breathing, O2 sat consistently less than 94%. Also recommend a vaccine in about a month.

## 2022-11-20 NOTE — Telephone Encounter (Signed)
Advise patient: He could try our pharmacy downstairs.   If the  price is out of his reach we could try molnupiravir.  Send a prescription if that is the case.

## 2022-11-20 NOTE — Telephone Encounter (Signed)
Rx sent to [facility]. Mychart message sent to Pt.

## 2022-11-26 ENCOUNTER — Encounter: Payer: Self-pay | Admitting: Internal Medicine

## 2022-11-26 ENCOUNTER — Ambulatory Visit (INDEPENDENT_AMBULATORY_CARE_PROVIDER_SITE_OTHER): Payer: Medicare Other | Admitting: Internal Medicine

## 2022-11-26 VITALS — BP 112/63 | Temp 97.8°F | Resp 12 | Ht 71.0 in | Wt 212.4 lb

## 2022-11-26 DIAGNOSIS — Z8546 Personal history of malignant neoplasm of prostate: Secondary | ICD-10-CM | POA: Diagnosis not present

## 2022-11-26 DIAGNOSIS — E559 Vitamin D deficiency, unspecified: Secondary | ICD-10-CM | POA: Diagnosis not present

## 2022-11-26 DIAGNOSIS — R739 Hyperglycemia, unspecified: Secondary | ICD-10-CM | POA: Diagnosis not present

## 2022-11-26 DIAGNOSIS — Z0000 Encounter for general adult medical examination without abnormal findings: Secondary | ICD-10-CM

## 2022-11-26 DIAGNOSIS — E782 Mixed hyperlipidemia: Secondary | ICD-10-CM | POA: Diagnosis not present

## 2022-11-26 LAB — CBC WITH DIFFERENTIAL/PLATELET
Basophils Absolute: 0 10*3/uL (ref 0.0–0.1)
Basophils Relative: 0.5 % (ref 0.0–3.0)
Eosinophils Absolute: 0.2 10*3/uL (ref 0.0–0.7)
Eosinophils Relative: 3.1 % (ref 0.0–5.0)
HCT: 39.5 % (ref 39.0–52.0)
Hemoglobin: 13.6 g/dL (ref 13.0–17.0)
Lymphocytes Relative: 23.6 % (ref 12.0–46.0)
Lymphs Abs: 1.4 10*3/uL (ref 0.7–4.0)
MCHC: 34.5 g/dL (ref 30.0–36.0)
MCV: 93.2 fl (ref 78.0–100.0)
Monocytes Absolute: 0.8 10*3/uL (ref 0.1–1.0)
Monocytes Relative: 14.3 % — ABNORMAL HIGH (ref 3.0–12.0)
Neutro Abs: 3.4 10*3/uL (ref 1.4–7.7)
Neutrophils Relative %: 58.5 % (ref 43.0–77.0)
Platelets: 182 10*3/uL (ref 150.0–400.0)
RBC: 4.24 Mil/uL (ref 4.22–5.81)
RDW: 13.1 % (ref 11.5–15.5)
WBC: 5.8 10*3/uL (ref 4.0–10.5)

## 2022-11-26 LAB — ALT: ALT: 21 U/L (ref 0–53)

## 2022-11-26 LAB — VITAMIN D 25 HYDROXY (VIT D DEFICIENCY, FRACTURES): VITD: 52.09 ng/mL (ref 30.00–100.00)

## 2022-11-26 LAB — PSA: PSA: 0.01 ng/mL — ABNORMAL LOW (ref 0.10–4.00)

## 2022-11-26 LAB — AST: AST: 20 U/L (ref 0–37)

## 2022-11-26 LAB — HEMOGLOBIN A1C: Hgb A1c MFr Bld: 6 % (ref 4.6–6.5)

## 2022-11-26 NOTE — Progress Notes (Unsigned)
Subjective:    Patient ID: Kyle Strickland, male    DOB: 1946-04-06, 77 y.o.   MRN: [id number]  DOS:  11/26/2022 Type of visit - description: cpx  For CPX Recently had COVID, feels much better. Currently with no fever chills or cough. 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 LIMA TO LAD, SVG TO OM1, SVG TO PDA-OM2 SEQ.;  Surgeon: Melrose Nakayama, MD;  Location: [place];  Service: Open Heart Surgery;  Laterality: N/A;   ENDOVEIN HARVEST OF GREATER SAPHENOUS VEIN Right 08/29/2021   Procedure: ENDOVEIN HARVEST OF GREATER SAPHENOUS VEIN;  Surgeon: Melrose Nakayama, MD;  Location: [place];  Service: Open Heart Surgery;  Laterality: Right;   LEFT HEART CATH AND CORONARY ANGIOGRAPHY N/A 08/23/2021   Procedure: LEFT HEART CATH AND CORONARY ANGIOGRAPHY;  Surgeon: Jettie Booze, MD;  Location: [place] [facility];  Service: Cardiovascular;  Laterality: N/A;   PROSTATECTOMY  2011   Dr Darcus Austin, [facility]   TEE WITHOUT CARDIOVERSION N/A 08/29/2021   Procedure: TRANSESOPHAGEAL ECHOCARDIOGRAM (TEE);  Surgeon: Melrose Nakayama, MD;  Location: [place];  Service: Open Heart Surgery;  Laterality: N/A;    Current Outpatient Medications  Medication Instructions   aspirin EC 81 mg, Oral, Every M-T-W-Th-Fr, In the evening.   atorvastatin (LIPITOR) 80 MG tablet TAKE 1 TABLET(80 MG) BY MOUTH DAILY   Cyanocobalamin (B-12 PO) Oral   Magnesium 500 MG TABS 1 tablet, Oral, Daily   metoprolol succinate (TOPROL-XL) 25 MG 24 hr tablet TAKE 1 TABLET(25 MG) BY MOUTH DAILY   Multiple  Vitamin (MULTIVITAMINS PO) 1 tablet, Oral, Daily,     Multiple Vitamins-Minerals (PRESERVISION AREDS 2 PO) 1 Capful, Oral, 2 times daily   Omega-3 Fatty Acids (FISH OIL PO) Oral   Vitamin D3 1,000 Units, Oral, Daily,         Objective:   Physical Exam BP 112/63 (BP Location: Left Arm, Cuff Size: Large)   Temp 97.8 F (36.6 C) (Oral)   Resp 12   Ht '5\' 11"'$  (1.803 m)   Wt 212 lb 6.4 oz (96.3 kg)   SpO2 97%   BMI 29.62 kg/m  General: Well developed, NAD, BMI noted Neck: No  thyromegaly  HEENT:  Normocephalic . Face symmetric, atraumatic Lungs:  CTA B Normal respiratory effort, no intercostal retractions, no accessory muscle use. Heart: RRR,  no murmur.  Abdomen:  Not distended, soft, non-tender. No rebound or rigidity.   Lower extremities: no pretibial edema bilaterally  Skin: Exposed areas without rash. Not pale. Not jaundice Neurologic:  alert & oriented X3.  Speech normal, gait appropriate for age and unassisted Strength symmetric and appropriate for age.  Psych: Cognition and judgment appear intact.  Cooperative with normal attention span and concentration.  Behavior appropriate. No anxious or depressed appearing.     Assessment     ASSESSMENT Elevated BP without HTN Hyperlipidemia H/o  prostate cancer: surgery at [facility] last urology visit ~ 2015 Obesity  Vitamin D deficiency CV: -CAD:   non-STEMI, status post CABG x4  11 -2- 22.   -  A. 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  Currently not anticoagulated  PLAN Here for CPX Hyperlipidemia: Well-controlled on Lipitor. Vitamin D deficiency: On supplements, checking labs CAD, asymptomatic continue aspirin, Lipitor, metoprolol. 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 RTC 5 to 6 months ~  -Tdap 2021 -PNM 13: 2016; PNM 23: 2018; PNM 20: just had covid, will wait - Shingrix: d/w pt - RSV d/w  - COVID vaccine  booster rec   -Had a flu shot - CCS: declines d/t age  H/o prostate cancer: Doing well, currently follow-up by PCP with labs yearly, check a PSA. Labs: AST ALT CBC A1c PSA vitamin D ACP information provided Doing great with diet and exercise    COVID-19: Patient is 77 years old, last COVID-vaccine was about a year ago, tested positive for COVID today, symptoms started yesterday.  He is a candidate for Paxlovid. Plan: Stat BMP, follow-up by prescription of Paxlovid if appropriate. Rest, fluids, symptomatic treatment. Will have to hold atorvastatin. Contact precautions for the next 7 days a days. ER if symptoms severe such as chest pain, difficulty breathing, O2 sat consistently less than 94%. Also recommend a vaccine in about a month.

## 2022-11-26 NOTE — Patient Instructions (Addendum)
You can try Voltaren gel for your hands  Check the  blood pressure regularly BP GOAL is between 110/65 and  135/85. If it is consistently higher or lower, let me know  In few weeks, you can proceed with the following vaccines: Pneumonia shot (PNM 20) Shingrix RSV COVID booster   GO TO THE LAB : Get the blood work     [place], [place] back for   a checkup in 5 to 6 months    "[place] of attorney" ,  "Living will" (Advance care planning documents)  If you already have a living will or healthcare power of attorney, is recommended you bring the copy to be scanned in your chart.   The document will be available to all the doctors you see in the system.  Advance care planning is a process that supports adults in  understanding and sharing their preferences regarding future medical care.  The patient's preferences are recorded in documents called Advance Directives and the can be modified at any time while the patient is in full mental capacity.   If you don't have one, please consider create one.      More information at: [email or web address]

## 2022-11-27 ENCOUNTER — Encounter: Payer: Self-pay | Admitting: Internal Medicine

## 2022-11-27 NOTE — Assessment & Plan Note (Signed)
Here for CPX Hyperlipidemia: Well-controlled on Lipitor. Vitamin D deficiency: On supplements, checking labs CAD, asymptomatic continue aspirin, Lipitor, metoprolol. 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 RTC 5 to 6 months

## 2022-11-27 NOTE — Assessment & Plan Note (Signed)
-  Tdap 2021 -PNM 13: 2016; PNM 23: 2018; PNM 20: just had covid, will wait - Shingrix: d/w pt - RSV d/w  - COVID vaccine  booster rec  -Had a flu shot - CCS: declines d/t age  H/o prostate cancer: Doing well, currently follow-up by PCP with labs yearly, check a PSA. Labs: AST ALT CBC A1c PSA vitamin D ACP information provided Doing great with diet and exercise

## 2023-05-05 ENCOUNTER — Encounter: Payer: Self-pay | Admitting: Internal Medicine

## 2023-05-05 ENCOUNTER — Ambulatory Visit (INDEPENDENT_AMBULATORY_CARE_PROVIDER_SITE_OTHER): Payer: Medicare Other | Admitting: Internal Medicine

## 2023-05-05 VITALS — BP 126/60 | HR 60 | Temp 98.2°F | Resp 16 | Ht 71.0 in | Wt 219.0 lb

## 2023-05-05 DIAGNOSIS — E782 Mixed hyperlipidemia: Secondary | ICD-10-CM

## 2023-05-05 DIAGNOSIS — R739 Hyperglycemia, unspecified: Secondary | ICD-10-CM | POA: Diagnosis not present

## 2023-05-05 DIAGNOSIS — R635 Abnormal weight gain: Secondary | ICD-10-CM

## 2023-05-05 DIAGNOSIS — R200 Anesthesia of skin: Secondary | ICD-10-CM | POA: Diagnosis not present

## 2023-05-05 DIAGNOSIS — I2510 Atherosclerotic heart disease of native coronary artery without angina pectoris: Secondary | ICD-10-CM

## 2023-05-05 LAB — LIPID PANEL
Cholesterol: 98 mg/dL (ref 0–200)
HDL: 37.8 mg/dL — ABNORMAL LOW (ref >39.00)
LDL Cholesterol: 44 mg/dL (ref 0–99)
NonHDL: 60.27
Total CHOL/HDL Ratio: 3
Triglycerides: 82 mg/dL (ref 0.0–149.0)
VLDL: 16.4 mg/dL (ref 0.0–40.0)

## 2023-05-05 LAB — BASIC METABOLIC PANEL
BUN: 19 mg/dL (ref 6–23)
CO2: 30 mEq/L (ref 19–32)
Calcium: 9.3 mg/dL (ref 8.4–10.5)
Chloride: 104 mEq/L (ref 96–112)
Creatinine, Ser: 0.93 mg/dL (ref 0.40–1.50)
GFR: 79.12 mL/min (ref >60.00)
Glucose, Bld: 95 mg/dL (ref 70–99)
Potassium: 4.5 mEq/L (ref 3.5–5.1)
Sodium: 140 mEq/L (ref 135–145)

## 2023-05-05 LAB — HEMOGLOBIN A1C: Hgb A1c MFr Bld: 5.9 % (ref 4.6–6.5)

## 2023-05-05 MED ORDER — FOLIC ACID 1 MG PO TABS: 1.0000 mg | ORAL_TABLET | Freq: Every day | ORAL | 3 refills | Status: DC

## 2023-05-05 NOTE — Progress Notes (Signed)
Subjective:    Patient ID: Kyle Strickland, male    DOB: 04/13/1946, 77 y.o.   MRN: [id number]  DOS:  05/05/2023 Type of visit - description: Follow-up  Chronic medical problems evaluated. In general feels well. Denies chest pain or difficulty breathing.  Weight gain noted, he plans to start losing weight.  Dietary advice provided  He again reports episodic numbness on the distal feet, mostly in the mornings, better after he gets active and takes a shower.  No major back pain.   Wt Readings from Last 3 Encounters:  05/05/23 219 lb (99.3 kg)  11/26/22 212 lb 6.4 oz (96.3 kg)  11/20/22 207 lb (93.9 kg)   Review of Systems See above   Past Medical History:  Diagnosis Date   Elevated blood-pressure reading without diagnosis of hypertension    Other and unspecified hyperlipidemia    Prostate cancer ([facility]) 2011   Vitamin D deficiency     Past Surgical History:  Procedure Laterality Date   CORONARY ARTERY BYPASS GRAFT N/A 08/29/2021   Procedure: CORONARY ARTERY BYPASS GRAFTING (CABG)x 4 ON CARDIOPULMONARY BYPASS. LIMA TO LAD, SVG TO OM1, SVG TO PDA-OM2 SEQ.;  Surgeon: Loreli Slot, MD;  Location: [facility];  Service: Open Heart Surgery;  Laterality: N/A;   ENDOVEIN HARVEST OF GREATER SAPHENOUS VEIN Right 08/29/2021   Procedure: ENDOVEIN HARVEST OF GREATER SAPHENOUS VEIN;  Surgeon: Loreli Slot, MD;  Location: [facility] OR;  Service: Open Heart Surgery;  Laterality: Right;   LEFT HEART CATH AND CORONARY ANGIOGRAPHY N/A 08/23/2021   Procedure: LEFT HEART CATH AND CORONARY ANGIOGRAPHY;  Surgeon: Corky Crafts, MD;  Location: [facility] INVASIVE CV LAB;  Service: Cardiovascular;  Laterality: N/A;   PROSTATECTOMY  2011   Dr Carin Primrose, [facility]   TEE WITHOUT CARDIOVERSION N/A 08/29/2021   Procedure: TRANSESOPHAGEAL ECHOCARDIOGRAM (TEE);  Surgeon: Loreli Slot, MD;  Location: [facility] OR;  Service: Open Heart Surgery;  Laterality: N/A;    Current Outpatient Medications  Medication  Instructions   aspirin EC 81 mg, Oral, Every M-T-W-Th-Fr, In the evening.   atorvastatin (LIPITOR) 80 MG tablet TAKE 1 TABLET(80 MG) BY MOUTH DAILY   Cyanocobalamin (B-12 PO) Oral   folic acid (FOLVITE) 1 mg, Oral, Daily   Magnesium 500 MG TABS 1 tablet, Oral, Daily   metoprolol succinate (TOPROL-XL) 25 MG 24 hr tablet TAKE 1 TABLET(25 MG) BY MOUTH DAILY   Multiple Vitamin (MULTIVITAMINS PO) 1 tablet, Oral, Daily,     Multiple Vitamins-Minerals (PRESERVISION AREDS 2 PO) 1 Capful, Oral, 2 times daily   Omega-3 Fatty Acids (FISH OIL PO) Oral   Vitamin D3 1,000 Units, Daily       Objective:   Physical Exam BP 126/60   Pulse 60   Temp 98.2 F (36.8 C) (Oral)   Resp 16   Ht 5\' 11"  (1.803 m)   Wt 219 lb (99.3 kg)   SpO2 96%   BMI 30.54 kg/m  General:   Well developed, NAD, BMI noted. HEENT:  Normocephalic . Face symmetric, atraumatic Lungs:  CTA B Normal respiratory effort, no intercostal retractions, no accessory muscle use. Heart: RRR,  no murmur.  Lower extremities: no pretibial edema bilaterally  Skin: Not pale. Not jaundice Neurologic:  alert & oriented X3.  Speech normal, gait appropriate for age and unassisted Psych--  Cognition and judgment appear intact.  Cooperative with normal attention span and concentration.  Behavior appropriate. No anxious or depressed appearing.      Assessment  ASSESSMENT Elevated BP without HTN Hyperlipidemia H/o  prostate cancer: surgery at [facility] last urology visit ~ 2015 Obesity  Neuropathy sxs (previously B12-Folic Acid-RPR: ok. Homocysteine slt high 2022) CV: -CAD:   non-STEMI, status post CABG x4  11 -2- 22.   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  Currently not anticoagulated  PLAN Hyperlipidemia: On atorvastatin, check labs. Further advise w/ results  CAD: Asymptomatic, next visit with cardiology 05-2023 Weight gain: Extensive dietary advice provided including  less  carbohydrates consumption, portion control, calorie counting. Neuropathy?  Chart reviewed, he c/o numbness in 2022, at the time B12- folic acid-RPR were negative; homocystine level was slightly up.  sxs are not severe or bothersome.  Plan: B12 supplements, folic acid, reassess periodically.  Eventually if symptoms become severe,  may need to see neurology for NCS Vaccine advice provided RTC 6 months

## 2023-05-05 NOTE — Assessment & Plan Note (Signed)
Hyperlipidemia: On atorvastatin, check labs. Further advise w/ results  CAD: Asymptomatic, next visit with cardiology 05-2023 Weight gain: Extensive dietary advice provided including  less carbohydrates consumption, portion control, calorie counting. Neuropathy?  Chart reviewed, he c/o numbness in 2022, at the time B12- folic acid-RPR were negative; homocystine level was slightly up.  sxs are not severe or bothersome.  Plan: B12 supplements, folic acid, reassess periodically.  Eventually if symptoms become severe,  may need to see neurology for NCS Vaccine advice provided RTC 6 months

## 2023-05-05 NOTE — Patient Instructions (Addendum)
For weight loss: Recommend to use an app called MYFITNESSPAL  to help you count your calories.   Vaccines I recommend, you can get them at your pharmacy;  RSV vaccine Flu shot this fall Shingrix (shingles) COVID-vaccine  Vitamins are recommended: Over-the-counter B12 supplement daily Folic acid 1 mg 1 tablet every day, prescription sent  GO TO THE LAB : Get the blood work     GO TO THE FRONT DESK, PLEASE SCHEDULE YOUR APPOINTMENTS Come back for   a physical exam in 6 months  Please bring or send [place] a copy of your Healthcare Power of Attorney for your chart.

## 2023-06-19 NOTE — Progress Notes (Signed)
HPI: Follow-up coronary artery disease.  Patient presented with non-ST elevation myocardial infarction October 2022.  Cardiac catheterization revealed severe three-vessel coronary artery disease and normal LV function.  Echocardiogram October 2022 showed normal LV function, mild right ventricular enlargement and mild aortic insufficiency.  Carotid Dopplers October 2022 near normal.  Patient did have postoperative atrial fibrillation.  Follow-up monitor March 2023 showed normal sinus rhythm with rare PAC and PVC but no atrial fibrillation or pauses.  Since last seen the patient denies any dyspnea on exertion, orthopnea, PND, pedal edema, palpitations, syncope or chest pain.   Current Outpatient Medications  Medication Sig Dispense Refill   aspirin EC 81 MG tablet Take 81 mg by mouth every Monday, Tuesday, Wednesday, Thursday, and Friday. In the evening.     atorvastatin (LIPITOR) 80 MG tablet TAKE 1 TABLET(80 MG) BY MOUTH DAILY 90 tablet 3   Cyanocobalamin (B-12 PO) Take by mouth.     folic acid (FOLVITE) 1 MG tablet Take 1 tablet (1 mg total) by mouth daily. 90 tablet 3   Magnesium 500 MG TABS Take 1 tablet by mouth daily.     metoprolol succinate (TOPROL-XL) 25 MG 24 hr tablet TAKE 1 TABLET(25 MG) BY MOUTH DAILY 90 tablet 3   Multiple Vitamin (MULTIVITAMINS PO) Take 1 tablet by mouth daily.     Multiple Vitamins-Minerals (PRESERVISION AREDS 2 PO) Take 1 Capful by mouth in the morning and at bedtime.     Omega-3 Fatty Acids (FISH OIL PO) Take by mouth.     Cholecalciferol (VITAMIN D3) 1000 UNITS CAPS Take 1,000 Units by mouth daily. (Patient not taking: Reported on 05/05/2023)     No current facility-administered medications for this visit.     Past Medical History:  Diagnosis Date   Elevated blood-pressure reading without diagnosis of hypertension    Other and unspecified hyperlipidemia    Prostate cancer (HCC) 2011   Vitamin D deficiency     Past Surgical History:  Procedure  Laterality Date   CORONARY ARTERY BYPASS GRAFT N/A 08/29/2021   Procedure: CORONARY ARTERY BYPASS GRAFTING (CABG)x 4 ON CARDIOPULMONARY BYPASS. LIMA TO LAD, SVG TO OM1, SVG TO PDA-OM2 SEQ.;  Surgeon: Loreli Slot, MD;  Location: [facility] OR;  Service: Open Heart Surgery;  Laterality: N/A;   ENDOVEIN HARVEST OF GREATER SAPHENOUS VEIN Right 08/29/2021   Procedure: ENDOVEIN HARVEST OF GREATER SAPHENOUS VEIN;  Surgeon: Loreli Slot, MD;  Location: [facility] OR;  Service: Open Heart Surgery;  Laterality: Right;   LEFT HEART CATH AND CORONARY ANGIOGRAPHY N/A 08/23/2021   Procedure: LEFT HEART CATH AND CORONARY ANGIOGRAPHY;  Surgeon: Corky Crafts, MD;  Location: [facility] INVASIVE CV LAB;  Service: Cardiovascular;  Laterality: N/A;   PROSTATECTOMY  2011   Dr Carin Primrose, 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   Tobacco Use   Smoking status: Never   Smokeless tobacco: Never  Substance and Sexual Activity   Alcohol use: Not Currently    Comment: Rarely   Drug use: No  Sexual activity: Not on file  Other Topics Concern   Not on file  Social History Narrative   Fun: Motorcycles, golf    Social Determinants of Health   Financial Resource Strain: Low Risk  (04/28/2023)   Overall Financial Resource Strain (CARDIA)    Difficulty of Paying Living Expenses: Not hard at all  Food Insecurity: No Food Insecurity (04/28/2023)   Hunger Vital Sign    Worried About Running Out of Food in the Last Year: Never true    Ran Out of Food in the Last  Year: Never true  Transportation Needs: No Transportation Needs (04/28/2023)   PRAPARE - Administrator, Civil Service (Medical): No    Lack of Transportation (Non-Medical): No  Physical Activity: Insufficiently Active (04/28/2023)   Exercise Vital Sign    Days of Exercise per Week: 3 days    Minutes of Exercise per Session: 30 min  Stress: No Stress Concern Present (04/28/2023)   [employer] of Occupational Health - Occupational Stress Questionnaire    Feeling of Stress : Only a little  Social Connections: Socially Integrated (04/28/2023)   Social Connection and Isolation Panel [NHANES]    Frequency of Communication with Friends and Family: More than three times a week    Frequency of Social Gatherings with Friends and Family: Twice a week    Attends Religious Services: More than 4 times per year    Active Member of [employer] or Organizations: Yes    Attends Engineer, structural: More than 4 times per year    Marital Status: Married  Catering manager Violence: Not At Risk (09/17/2022)   Humiliation, Afraid, Rape, and Kick questionnaire    Fear of Current or Ex-Partner: No    Emotionally Abused: No    Physically Abused: No    Sexually Abused: No    Family History  Problem Relation Age of Onset   Cancer Maternal Grandfather        bladder   Diabetes Paternal Grandfather        TIAs; CVA   Stroke Paternal Grandfather        early 78s   Heart attack Mother 51   Transient ischemic attack Paternal Uncle    Dementia Father        CVAs   Diabetes Father        borderline   Colon cancer Neg Hx     ROS: no fevers or chills, productive cough, hemoptysis, dysphasia, odynophagia, melena, hematochezia, dysuria, hematuria, rash, seizure activity, orthopnea, PND, pedal edema, claudication. Remaining systems are negative.  Physical Exam: Well-developed well-nourished in no acute distress.  Skin is warm and dry.  HEENT is normal.  Neck is supple.  Chest is clear to  auscultation with normal expansion.  Cardiovascular exam is regular rate and rhythm.  Abdominal exam nontender or distended. No masses palpated. Extremities show no edema. neuro grossly intact  EKG Interpretation Date/Time:  Wednesday June 25 2023 07:53:52 EDT Ventricular Rate:  57 PR Interval:  156 QRS Duration:  136 QT Interval:  442 QTC Calculation: 430 R Axis:   -42  Text Interpretation: Sinus bradycardia Left axis deviation Right bundle branch block When compared with ECG of 13-Dec-2021 14:12, No significant change was found Confirmed by Olga Millers ([id number]) on 06/25/2023 7:56:15 AM    A/P  1 coronary artery disease status post coronary bypass graft-patient doing well from a symptomatic standpoint.  Continue aspirin and statin.  2 hyperlipidemia-continue statin.  3 hypertension-patient's blood pressure  is controlled.  Continue present medications.  Olga Millers, MD

## 2023-06-25 ENCOUNTER — Ambulatory Visit: Payer: Medicare Other | Attending: Cardiology | Admitting: Cardiology

## 2023-06-25 ENCOUNTER — Encounter: Payer: Self-pay | Admitting: Cardiology

## 2023-06-25 VITALS — BP 120/74 | HR 57 | Ht 71.0 in | Wt 217.8 lb

## 2023-06-25 DIAGNOSIS — E782 Mixed hyperlipidemia: Secondary | ICD-10-CM

## 2023-06-25 DIAGNOSIS — I480 Paroxysmal atrial fibrillation: Secondary | ICD-10-CM

## 2023-06-25 DIAGNOSIS — I10 Essential (primary) hypertension: Secondary | ICD-10-CM | POA: Diagnosis not present

## 2023-06-25 NOTE — Patient Instructions (Signed)
Medication Instructions:   Your physician recommends that you continue on your current medications as directed. Please refer to the Current Medication list given to you today.   *If you need a refill on your cardiac medications before your next appointment, please call your pharmacy*   Lab Work:  None ordered.  If you have labs (blood work) drawn today and your tests are completely normal, you will receive your results only by: MyChart Message (if you have MyChart) OR A paper copy in the mail If you have any lab test that is abnormal or we need to change your treatment, we will call you to review the results.   Testing/Procedures:  None ordered.    Follow-Up: At [facility], you and your health needs are our priority.  As part of our continuing mission to provide you with exceptional heart care, we have created designated Provider Care Teams.  These Care Teams include your primary Cardiologist (physician) and Advanced Practice Providers (APPs -  Physician Assistants and Nurse Practitioners) who all work together to provide you with the care you need, when you need it.  We recommend signing up for the patient portal called "MyChart".  Sign up information is provided on this After Visit Summary.  MyChart is used to connect with patients for Virtual Visits (Telemedicine).  Patients are able to view lab/test results, encounter notes, upcoming appointments, etc.  Non-urgent messages can be sent to your provider as well.   To learn more about what you can do with MyChart, go to [email or web address].    Your next appointment:   1 year(s)  Provider:   Olga Millers, MD    Other Instructions  Your physician wants you to follow-up in: 1 year.  You will receive a reminder letter in the mail two months in advance. If you don't receive a letter, please call our office to schedule the follow-up appointment.

## 2023-10-07 LAB — HEMOGLOBIN A1C: A1c: 5.7

## 2023-10-10 ENCOUNTER — Other Ambulatory Visit: Payer: Self-pay | Admitting: Interventional Cardiology

## 2023-10-31 DIAGNOSIS — H5213 Myopia, bilateral: Secondary | ICD-10-CM | POA: Diagnosis not present

## 2023-10-31 DIAGNOSIS — H2513 Age-related nuclear cataract, bilateral: Secondary | ICD-10-CM | POA: Diagnosis not present

## 2023-11-05 ENCOUNTER — Ambulatory Visit: Payer: Medicare Other | Admitting: Internal Medicine

## 2023-11-05 ENCOUNTER — Encounter: Payer: Self-pay | Admitting: Internal Medicine

## 2023-11-05 VITALS — BP 122/66 | HR 54 | Temp 97.5°F | Resp 16 | Ht 71.0 in | Wt 218.0 lb

## 2023-11-05 DIAGNOSIS — E782 Mixed hyperlipidemia: Secondary | ICD-10-CM | POA: Diagnosis not present

## 2023-11-05 DIAGNOSIS — Z8546 Personal history of malignant neoplasm of prostate: Secondary | ICD-10-CM

## 2023-11-05 DIAGNOSIS — I2510 Atherosclerotic heart disease of native coronary artery without angina pectoris: Secondary | ICD-10-CM | POA: Diagnosis not present

## 2023-11-05 DIAGNOSIS — L821 Other seborrheic keratosis: Secondary | ICD-10-CM

## 2023-11-05 DIAGNOSIS — G629 Polyneuropathy, unspecified: Secondary | ICD-10-CM

## 2023-11-05 DIAGNOSIS — Z23 Encounter for immunization: Secondary | ICD-10-CM

## 2023-11-05 LAB — PSA: PSA: 0.03 ng/mL — ABNORMAL LOW (ref 0.10–4.00)

## 2023-11-05 LAB — CBC WITH DIFFERENTIAL/PLATELET
Basophils Absolute: 0 10*3/uL (ref 0.0–0.1)
Basophils Relative: 0.5 % (ref 0.0–3.0)
Eosinophils Absolute: 0.2 10*3/uL (ref 0.0–0.7)
Eosinophils Relative: 4.2 % (ref 0.0–5.0)
HCT: 39.2 % (ref 39.0–52.0)
Hemoglobin: 13.2 g/dL (ref 13.0–17.0)
Lymphocytes Relative: 22.6 % (ref 12.0–46.0)
Lymphs Abs: 1.2 10*3/uL (ref 0.7–4.0)
MCHC: 33.6 g/dL (ref 30.0–36.0)
MCV: 94.8 fL (ref 78.0–100.0)
Monocytes Absolute: 0.9 10*3/uL (ref 0.1–1.0)
Monocytes Relative: 17.5 % — ABNORMAL HIGH (ref 3.0–12.0)
Neutro Abs: 2.8 10*3/uL (ref 1.4–7.7)
Neutrophils Relative %: 55.2 % (ref 43.0–77.0)
Platelets: 180 10*3/uL (ref 150.0–400.0)
RBC: 4.13 Mil/uL — ABNORMAL LOW (ref 4.22–5.81)
RDW: 13.7 % (ref 11.5–15.5)
WBC: 5.1 10*3/uL (ref 4.0–10.5)

## 2023-11-05 LAB — AST: AST: 20 U/L (ref 0–37)

## 2023-11-05 LAB — ALT: ALT: 22 U/L (ref 0–53)

## 2023-11-05 NOTE — Patient Instructions (Addendum)
 Vaccines I recommend: Covid booster RSV  Check the  blood pressure regularly Blood pressure goal:  between 110/65 and  135/85. If it is consistently higher or lower, let me know  We are referring you to dermatology  GO TO THE LAB : Get the blood work     Next visit with me in 5 months for a physical exam Please schedule it at the front desk

## 2023-11-05 NOTE — Progress Notes (Signed)
 Subjective:    Patient ID: Kyle Strickland, male    DOB: Oct 09, 1946, 78 y.o.   MRN: [id number]  DOS:  11/05/2023 Type of visit - description: Follow-up  Chronic medical problems addressed. Neuropathy symptoms better. C/o pain of the hands, mostly at the base of the thumbs, relatively well-controlled. Denies chest pain difficulty breathing. No edema or palpitations.  Wt Readings from Last 3 Encounters:  11/05/23 218 lb (98.9 kg)  06/25/23 217 lb 12.8 oz (98.8 kg)  05/05/23 219 lb (99.3 kg)     Review of Systems See above   Past Medical History:  Diagnosis Date   Elevated blood-pressure reading without diagnosis of hypertension    Other and unspecified hyperlipidemia    Prostate cancer (HCC) 2011   Vitamin D  deficiency     Past Surgical History:  Procedure Laterality Date   CORONARY ARTERY BYPASS GRAFT N/A 08/29/2021   Procedure: CORONARY ARTERY BYPASS GRAFTING (CABG)x 4 ON CARDIOPULMONARY BYPASS. LIMA TO LAD, SVG TO OM1, SVG TO PDA-OM2 SEQ.;  Surgeon: Kerrin Elspeth BROCKS, MD;  Location: [facility] OR;  Service: Open Heart Surgery;  Laterality: N/A;   ENDOVEIN HARVEST OF GREATER SAPHENOUS VEIN Right 08/29/2021   Procedure: ENDOVEIN HARVEST OF GREATER SAPHENOUS VEIN;  Surgeon: Kerrin Elspeth BROCKS, MD;  Location: [facility] OR;  Service: Open Heart Surgery;  Laterality: Right;   LEFT HEART CATH AND CORONARY ANGIOGRAPHY N/A 08/23/2021   Procedure: LEFT HEART CATH AND CORONARY ANGIOGRAPHY;  Surgeon: Dann Candyce RAMAN, MD;  Location: [facility] INVASIVE CV LAB;  Service: Cardiovascular;  Laterality: N/A;   PROSTATECTOMY  2011   Dr Charles, [facility]   TEE WITHOUT CARDIOVERSION N/A 08/29/2021   Procedure: TRANSESOPHAGEAL ECHOCARDIOGRAM (TEE);  Surgeon: Kerrin Elspeth BROCKS, MD;  Location: [facility] OR;  Service: Open Heart Surgery;  Laterality: N/A;    Current Outpatient Medications  Medication Instructions   aspirin  EC 81 mg, Every M-T-W-Th-Fr   atorvastatin  (LIPITOR ) 80 MG tablet TAKE 1 TABLET(80 MG) BY  MOUTH DAILY   Cyanocobalamin  (B-12 PO) Take by mouth.   folic acid  (FOLVITE ) 1 mg, Oral, Daily   Magnesium  500 MG TABS 1 tablet, Daily   metoprolol  succinate (TOPROL -XL) 25 MG 24 hr tablet TAKE 1 TABLET(25 MG) BY MOUTH DAILY   Multiple Vitamin (MULTIVITAMINS PO) 1 tablet, Daily   Multiple Vitamins-Minerals (PRESERVISION AREDS 2 PO) 1 Capful, Daily   Omega-3 Fatty Acids  (FISH OIL PO) Take by mouth.   Vitamin D3 1,000 Units, Daily       Objective:   Physical Exam BP 122/66   Pulse (!) 54   Temp (!) 97.5 F (36.4 C) (Oral)   Resp 16   Ht 5' 11 (1.803 m)   Wt 218 lb (98.9 kg)   SpO2 96%   BMI 30.40 kg/m  General:   Well developed, NAD, BMI noted. HEENT:  Normocephalic . Face symmetric, atraumatic Lungs:  CTA B Normal respiratory effort, no intercostal retractions, no accessory muscle use. Heart: RRR,  no murmur.  Lower extremities: no pretibial edema bilaterally Hands: Bony changes consistent with DJD, no synovitis. Skin: Multiple SKs from the neck up. Neurologic:  alert & oriented X3.  Speech normal, gait appropriate for age and unassisted Psych--  Cognition and judgment appear intact.  Cooperative with normal attention span and concentration.  Behavior appropriate. No anxious or depressed appearing.      Assessment   ASSESSMENT Elevated BP without HTN Hyperlipidemia H/o  prostate cancer: surgery at [facility] last urology visit ~ 2015 Obesity  Neuropathy  sxs (previously B12-Folic Acid -RPR: ok. Homocysteine slt high 2022) CV: -CAD:   non-STEMI, status post CABG x4  11 -2- 22.   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  Currently not anticoagulated  PLAN Elevated BP without HTN: Normal ambulatory BPs CAD: Saw cardiology 06/25/2023, no changes recommended.  Check CBC High cholesterol: Well-controlled on atorvastatin  80 mg, check AST ALT. Neuropathy: See LOV, since then started taking folic acid  tablet, a multivitamin that  contains B12 and vitamin D  and symptoms are much improved. 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 Overweight: BMI 30, is doing better with diet, has a hard time following the exercise routine.  Consider a gym membership at [employer].   Vaccine advice Got a flu and PNM 20 shots today.  Advised about COVID and RSV.: RTC CPX 5 months

## 2023-11-05 NOTE — Assessment & Plan Note (Signed)
 Elevated BP without HTN: Normal ambulatory BPs CAD: Saw cardiology 06/25/2023, no changes recommended.  Check CBC High cholesterol: Well-controlled on atorvastatin  80 mg, check AST ALT. Neuropathy: See LOV, since then started taking folic acid  tablet, a multivitamin that contains B12 and vitamin D  and symptoms are much improved. 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 Overweight: BMI 30, is doing better with diet, has a hard time following the exercise routine.  Consider a gym membership at [employer].   Vaccine advice Got a flu and PNM 20 shots today.  Advised about COVID and RSV.: RTC CPX 5 months

## 2023-11-17 DIAGNOSIS — L570 Actinic keratosis: Secondary | ICD-10-CM | POA: Diagnosis not present

## 2023-11-17 DIAGNOSIS — L821 Other seborrheic keratosis: Secondary | ICD-10-CM | POA: Diagnosis not present

## 2023-11-17 DIAGNOSIS — D224 Melanocytic nevi of scalp and neck: Secondary | ICD-10-CM | POA: Diagnosis not present

## 2023-11-17 DIAGNOSIS — L820 Inflamed seborrheic keratosis: Secondary | ICD-10-CM | POA: Diagnosis not present

## 2024-04-05 ENCOUNTER — Ambulatory Visit (INDEPENDENT_AMBULATORY_CARE_PROVIDER_SITE_OTHER): Payer: Medicare Other | Admitting: Internal Medicine

## 2024-04-05 ENCOUNTER — Encounter: Payer: Self-pay | Admitting: Internal Medicine

## 2024-04-05 VITALS — BP 130/76 | HR 56 | Temp 97.7°F | Resp 16 | Ht 71.0 in | Wt 215.5 lb

## 2024-04-05 DIAGNOSIS — E782 Mixed hyperlipidemia: Secondary | ICD-10-CM | POA: Diagnosis not present

## 2024-04-05 DIAGNOSIS — Z8546 Personal history of malignant neoplasm of prostate: Secondary | ICD-10-CM

## 2024-04-05 DIAGNOSIS — Z0000 Encounter for general adult medical examination without abnormal findings: Secondary | ICD-10-CM

## 2024-04-05 DIAGNOSIS — R739 Hyperglycemia, unspecified: Secondary | ICD-10-CM | POA: Diagnosis not present

## 2024-04-05 DIAGNOSIS — I2510 Atherosclerotic heart disease of native coronary artery without angina pectoris: Secondary | ICD-10-CM

## 2024-04-05 LAB — CBC WITH DIFFERENTIAL/PLATELET
Basophils Absolute: 0 10*3/uL (ref 0.0–0.1)
Basophils Relative: 0.6 % (ref 0.0–3.0)
Eosinophils Absolute: 0.2 10*3/uL (ref 0.0–0.7)
Eosinophils Relative: 3.6 % (ref 0.0–5.0)
HCT: 40.2 % (ref 39.0–52.0)
Hemoglobin: 13.9 g/dL (ref 13.0–17.0)
Lymphocytes Relative: 26.7 % (ref 12.0–46.0)
Lymphs Abs: 1.3 10*3/uL (ref 0.7–4.0)
MCHC: 34.5 g/dL (ref 30.0–36.0)
MCV: 91.1 fl (ref 78.0–100.0)
Monocytes Absolute: 0.9 10*3/uL (ref 0.1–1.0)
Monocytes Relative: 19.1 % — ABNORMAL HIGH (ref 3.0–12.0)
Neutro Abs: 2.3 10*3/uL (ref 1.4–7.7)
Neutrophils Relative %: 50 % (ref 43.0–77.0)
Platelets: 180 10*3/uL (ref 150.0–400.0)
RBC: 4.41 Mil/uL (ref 4.22–5.81)
RDW: 13.3 % (ref 11.5–15.5)
WBC: 4.7 10*3/uL (ref 4.0–10.5)

## 2024-04-05 LAB — COMPREHENSIVE METABOLIC PANEL WITH GFR
ALT: 24 U/L (ref 0–53)
AST: 20 U/L (ref 0–37)
Albumin: 4.1 g/dL (ref 3.5–5.2)
Alkaline Phosphatase: 82 U/L (ref 39–117)
BUN: 19 mg/dL (ref 6–23)
CO2: 28 meq/L (ref 19–32)
Calcium: 9.2 mg/dL (ref 8.4–10.5)
Chloride: 105 meq/L (ref 96–112)
Creatinine, Ser: 0.92 mg/dL (ref 0.40–1.50)
GFR: 79.64 mL/min (ref >60.00)
Glucose, Bld: 101 mg/dL — ABNORMAL HIGH (ref 70–99)
Potassium: 4.4 meq/L (ref 3.5–5.1)
Sodium: 140 meq/L (ref 135–145)
Total Bilirubin: 0.9 mg/dL (ref 0.2–1.2)
Total Protein: 6.8 g/dL (ref 6.0–8.3)

## 2024-04-05 LAB — LIPID PANEL
Cholesterol: 107 mg/dL (ref 0–200)
HDL: 40.2 mg/dL (ref >39.00)
LDL Cholesterol: 48 mg/dL (ref 0–99)
NonHDL: 66.99
Total CHOL/HDL Ratio: 3
Triglycerides: 94 mg/dL (ref 0.0–149.0)
VLDL: 18.8 mg/dL (ref 0.0–40.0)

## 2024-04-05 LAB — HEMOGLOBIN A1C: Hgb A1c MFr Bld: 6.1 % (ref 4.6–6.5)

## 2024-04-05 NOTE — Progress Notes (Unsigned)
 Subjective:    Patient ID: Kyle Strickland, male    DOB: [birth year], 78 y.o.   MRN: [id number]  DOS:  04/05/2024 Type of visit - description: CPX  Here for CPX Chronic medical problems addressed. He is doing great. Occasionally feels off balance, no dizziness. Denies chest pain or difficulty breathing No GI sxs  or LUTS  Review of Systems See above   Past Medical History:  Diagnosis Date   Elevated blood-pressure reading without diagnosis of hypertension    Other and unspecified hyperlipidemia    Prostate cancer (HCC) 2011   Vitamin D  deficiency     Past Surgical History:  Procedure Laterality Date   CORONARY ARTERY BYPASS GRAFT N/A 08/29/2021   Procedure: CORONARY ARTERY BYPASS GRAFTING (CABG)x 4 ON CARDIOPULMONARY BYPASS. LIMA TO LAD, SVG TO OM1, SVG TO PDA-OM2 SEQ.;  Surgeon: Zelphia Higashi, MD;  Location: [facility] OR;  Service: Open Heart Surgery;  Laterality: N/A;   ENDOVEIN HARVEST OF GREATER SAPHENOUS VEIN Right 08/29/2021   Procedure: ENDOVEIN HARVEST OF GREATER SAPHENOUS VEIN;  Surgeon: Zelphia Higashi, MD;  Location: [facility] OR;  Service: Open Heart Surgery;  Laterality: Right;   LEFT HEART CATH AND CORONARY ANGIOGRAPHY N/A 08/23/2021   Procedure: LEFT HEART CATH AND CORONARY ANGIOGRAPHY;  Surgeon: Lucendia Rusk, MD;  Location: [facility] INVASIVE CV LAB;  Service: Cardiovascular;  Laterality: N/A;   PROSTATECTOMY  2011   Dr Theo First, [facility]   TEE WITHOUT CARDIOVERSION N/A 08/29/2021   Procedure: TRANSESOPHAGEAL ECHOCARDIOGRAM (TEE);  Surgeon: Zelphia Higashi, MD;  Location: [facility] OR;  Service: Open Heart Surgery;  Laterality: N/A;    Current Outpatient Medications  Medication Instructions   aspirin  EC 81 mg, Every M-T-W-Th-Fr   atorvastatin  (LIPITOR ) 80 MG tablet TAKE 1 TABLET(80 MG) BY MOUTH DAILY   Cyanocobalamin  (B-12 PO) Take by mouth.   folic acid  (FOLVITE ) 1 mg, Oral, Daily   Magnesium  500 MG TABS 1 tablet, Daily   metoprolol  succinate (TOPROL -XL) 25 MG 24 hr  tablet TAKE 1 TABLET(25 MG) BY MOUTH DAILY   Multiple Vitamin (MULTIVITAMINS PO) 1 tablet, Daily   Multiple Vitamins-Minerals (PRESERVISION AREDS 2 PO) 1 Capful, Daily   Omega-3 Fatty Acids  (FISH OIL PO) Take by mouth.       Objective:   Physical Exam BP 130/76   Pulse (!) 56   Temp 97.7 F (36.5 C) (Oral)   Resp 16   Ht 5\' 11"  (1.803 m)   Wt 215 lb 8 oz (97.8 kg)   SpO2 97%   PF (!) 999 L/min   BMI 30.06 kg/m  General: Well developed, NAD, BMI noted Neck: No  thyromegaly  HEENT:  Normocephalic . Face symmetric, atraumatic Lungs:  CTA B Normal respiratory effort, no intercostal retractions, no accessory muscle use. Heart: RRR,  no murmur.  Abdomen:  Not distended, soft, non-tender. No rebound or rigidity.   Lower extremities: no pretibial edema bilaterally  Skin: Exposed areas without rash. Not pale. Not jaundice Neurologic:  alert & oriented X3.  Speech normal, gait appropriate for age and unassisted Strength symmetric and appropriate for age.  Psych: Cognition and judgment appear intact.  Cooperative with normal attention span and concentration.  Behavior appropriate. No anxious or depressed appearing.     Assessment     ASSESSMENT Elevated BP without HTN Hyperlipidemia H/o  prostate cancer: surgery at [facility] last urology visit ~ 2015 Obesity  Neuropathy sxs (previously B12-Folic Acid -RPR: ok. Homocysteine slt high 2022) CV: -CAD:   non-STEMI,  status post CABG x4  11 -2- 22.   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  Currently not anticoagulated  PLAN Here for CPX -Tdap 2021 -PNM 13: 2016; PNM 23: 2018; PNM 20: 2025   -vaccines are recommended: Shingrix , RSV, flu shot every fall, COVID boosters from 06/2023   - CCS: declines d/t age  H/o prostate cancer: asx,  follow-up by PCP w/ yearly PSA. Labs: CMP FLP CBC A1c PSA ACP information provided Doing great with diet and exercise  Elevated BP without HTN:  Normal ambulatory BPs, 123/58 to  178/74 Hyperlipidemia: On atorvastatin , checking labs. CAD: On aspirin , beta-blockers, atorvastatin .  Asymptomatic.  To see cardiology later this year Neuropathy: On folic acid  which has helped.  Take other vitamins as well. Mild lack of balance: No dizziness, no falls.  He probably need to increase his muscle mass to regain stability, recommend to consider PT or going to the gym. Skin lesions: Sees dermatology RTC 1 year

## 2024-04-05 NOTE — Patient Instructions (Signed)
 Since to consider: Shingrix  RSV A COVID booster A flu shot every fall  Continue checking your blood pressure regularly Blood pressure goal:  between 110/65 and  135/85. If it is consistently higher or lower, let me know     GO TO THE LAB :  Get the blood work   Your results will be posted on MyChart with my comments  Next office visit for a physical exam in 1 year Please make an appointment before you leave today     "Health Care Power of attorney" (Also know as a  "Living will" or  Advance care planning documents)  If you already have a living will or healthcare power of attorney, is recommended you bring the copy to be scanned in your chart.   The document will be available to all the doctors you see in the system.  If you are over 35 y/o and don't have the document, please read:  Advance care planning is a process that supports adults in  understanding and sharing their preferences regarding future medical care.  The patient's preferences are recorded in documents called Advance Directives and the can be modified at any time while the patient is in full mental capacity.     More information at: [email or web address]

## 2024-04-06 ENCOUNTER — Encounter: Payer: Self-pay | Admitting: Internal Medicine

## 2024-04-06 NOTE — Assessment & Plan Note (Signed)
 Here for CPX   Other issues addressed today Elevated BP without HTN: Normal ambulatory BPs, ~ 123/58  Hyperlipidemia: On atorvastatin , checking labs. CAD: On aspirin , beta-blockers, atorvastatin .  Asymptomatic.  To see cardiology later this year Neuropathy: On folic acid  which has helped.  Take other vitamins as well. Mild lack of balance: No dizziness, no falls.  He probably need to increase his muscle mass to regain stability, recommend to consider PT or going to the gym. Skin lesions: Sees dermatology RTC 1 year

## 2024-04-06 NOTE — Assessment & Plan Note (Signed)
 Here for CPX -Tdap 2021 -PNM 13: 2016; PNM 23: 2018; PNM 20: 2025   -vaccines are recommended: Shingrix , RSV, flu shot every fall, COVID boosters from 06/2023 - CCS: declines d/t age  H/o prostate cancer: asx,  follow-up by PCP w/ yearly PSA. Labs: CMP FLP CBC A1c PSA ACP information provided Doing great with diet and exercise

## 2024-04-08 ENCOUNTER — Encounter: Payer: Self-pay | Admitting: Internal Medicine

## 2024-04-08 LAB — PSA: PSA: 0.03 ng/mL — ABNORMAL LOW (ref 0.10–4.00)

## 2024-04-09 ENCOUNTER — Ambulatory Visit: Payer: Self-pay | Admitting: Internal Medicine

## 2024-04-09 DIAGNOSIS — D72821 Monocytosis (symptomatic): Secondary | ICD-10-CM

## 2024-04-13 ENCOUNTER — Other Ambulatory Visit: Payer: Self-pay | Admitting: Family

## 2024-04-13 ENCOUNTER — Inpatient Hospital Stay: Admitting: Family

## 2024-04-13 ENCOUNTER — Inpatient Hospital Stay: Attending: Hematology & Oncology

## 2024-04-13 ENCOUNTER — Encounter: Payer: Self-pay | Admitting: Family

## 2024-04-13 VITALS — BP 116/50 | HR 70 | Temp 99.0°F | Resp 18 | Ht 71.0 in | Wt 215.1 lb

## 2024-04-13 DIAGNOSIS — Z8052 Family history of malignant neoplasm of bladder: Secondary | ICD-10-CM | POA: Diagnosis not present

## 2024-04-13 DIAGNOSIS — D72821 Monocytosis (symptomatic): Secondary | ICD-10-CM | POA: Insufficient documentation

## 2024-04-13 DIAGNOSIS — D72829 Elevated white blood cell count, unspecified: Secondary | ICD-10-CM

## 2024-04-13 DIAGNOSIS — G629 Polyneuropathy, unspecified: Secondary | ICD-10-CM | POA: Diagnosis not present

## 2024-04-13 DIAGNOSIS — Z8546 Personal history of malignant neoplasm of prostate: Secondary | ICD-10-CM | POA: Diagnosis not present

## 2024-04-13 LAB — CBC WITH DIFFERENTIAL (CANCER CENTER ONLY)
Abs Immature Granulocytes: 0.01 10*3/uL (ref 0.00–0.07)
Basophils Absolute: 0.1 10*3/uL (ref 0.0–0.1)
Basophils Relative: 1 %
Eosinophils Absolute: 0.1 10*3/uL (ref 0.0–0.5)
Eosinophils Relative: 2 %
HCT: 39.7 % (ref 39.0–52.0)
Hemoglobin: 13.6 g/dL (ref 13.0–17.0)
Immature Granulocytes: 0 %
Lymphocytes Relative: 21 %
Lymphs Abs: 1.4 10*3/uL (ref 0.7–4.0)
MCH: 31.6 pg (ref 26.0–34.0)
MCHC: 34.3 g/dL (ref 30.0–36.0)
MCV: 92.3 fL (ref 80.0–100.0)
Monocytes Absolute: 0.8 10*3/uL (ref 0.1–1.0)
Monocytes Relative: 13 %
Neutro Abs: 4.3 10*3/uL (ref 1.7–7.7)
Neutrophils Relative %: 63 %
Platelet Count: 197 10*3/uL (ref 150–400)
RBC: 4.3 MIL/uL (ref 4.22–5.81)
RDW: 13.1 % (ref 11.5–15.5)
WBC Count: 6.7 10*3/uL (ref 4.0–10.5)
nRBC: 0 % (ref 0.0–0.2)

## 2024-04-13 LAB — CMP (CANCER CENTER ONLY)
ALT: 22 U/L (ref 0–44)
AST: 20 U/L (ref 15–41)
Albumin: 4.4 g/dL (ref 3.5–5.0)
Alkaline Phosphatase: 83 U/L (ref 38–126)
Anion gap: 7 (ref 5–15)
BUN: 20 mg/dL (ref 8–23)
CO2: 28 mmol/L (ref 22–32)
Calcium: 9.5 mg/dL (ref 8.9–10.3)
Chloride: 104 mmol/L (ref 98–111)
Creatinine: 1.01 mg/dL (ref 0.61–1.24)
GFR, Estimated: >60 mL/min (ref >60)
Glucose, Bld: 106 mg/dL — ABNORMAL HIGH (ref 70–99)
Potassium: 4.5 mmol/L (ref 3.5–5.1)
Sodium: 139 mmol/L (ref 135–145)
Total Bilirubin: 1.2 mg/dL (ref 0.0–1.2)
Total Protein: 7 g/dL (ref 6.5–8.1)

## 2024-04-13 LAB — SAVE SMEAR(SSMR), FOR PROVIDER SLIDE REVIEW

## 2024-04-13 LAB — LACTATE DEHYDROGENASE: LDH: 139 U/L (ref 98–192)

## 2024-04-13 NOTE — Progress Notes (Signed)
 Hematology/Oncology Consultation   Name: Kyle Strickland      MRN: [id number]    Location: Room/bed info not found  Date: 04/13/2024 Time:3:25 PM   REFERRING PHYSICIAN:  Devonna Foley, MD  REASON FOR CONSULT:  Chronic idiopathic monocytosis    DIAGNOSIS: Chronic monocytosis   HISTORY OF PRESENT ILLNESS:  [name] is a very pleasant 78 yo caucasian gentleman with at least an 11 year history of intermittent monocytosis. Today's CBC with diff is unremarkable. Flow cytometry sent.   He has not had any issue with frequent or recurrent infections.  No fever, chills, n/v, cough, rash, dizziness, SOB, chest pain, palpitations, abdominal pain or changes in bowel or bladder habits at this time.  He notes occasional right sided bloody nose that is easy to stop. He states that this is from thin mucosal lining due to past nasal spray use complicated by the dry air.  No other blood loss noted. No bruising or petechiae.  No known familial history of blood abnormalities like monocytosis.  He had a NSTEMI in 10/22 quickly followed by CABG x 4 in 08/2021. He developed post op atrial fib. This was monitored by cardiology and by 12/2021 he was in NSR with rare PAC and PVC's with a fib. He is currently on Toprol  XL, Lipitor  and 1 baby aspirin  daily.   He has past history of prostate cancer treated with radical prostatectomy at [facility] in 05/2010 with Dr. Maisie Strickland. No chemo or radiation. PSA earlier this month was 0.03.  His maternal grandfather had bladder cancer.  No diabetes or history of thyroid  disease.  He has issues with balance when feeling fatigued.  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  Thankfully he has not had any falls or syncope.  He enjoys walking his dog several times a day for exercise.  No smoking, ETOH or recreational drug use.  Appetite and hydration are good. Weight is described as stable at 215 lbs.  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   ROS: All other 10 point review of  systems is negative.   PAST MEDICAL HISTORY:   Past Medical History:  Diagnosis Date   Elevated blood-pressure reading without diagnosis of hypertension    Other and unspecified hyperlipidemia    Prostate cancer (HCC) 2011   Vitamin D  deficiency     ALLERGIES: Allergies  Allergen Reactions   Ciprofloxacin     Hives post op   Ditropan [Oxybutynin Chloride]     Hives 2011, post op   Codeine     Nausea and headaches       MEDICATIONS:  Current Outpatient Medications on File Prior to Visit  Medication Sig Dispense Refill   aspirin  EC 81 MG tablet Take 81 mg by mouth every Monday, Tuesday, Wednesday, Thursday, and Friday. In the evening.     atorvastatin  (LIPITOR ) 80 MG tablet TAKE 1 TABLET(80 MG) BY MOUTH DAILY 90 tablet 3   folic acid  (FOLVITE ) 1 MG tablet Take 1 tablet (1 mg total) by mouth daily. 90 tablet 3   Magnesium  500 MG TABS Take 1 tablet by mouth daily.     metoprolol  succinate (TOPROL -XL) 25 MG 24 hr tablet TAKE 1 TABLET(25 MG) BY MOUTH DAILY 90 tablet 3   Multiple Vitamin (MULTIVITAMINS PO) Take 1 tablet by mouth daily.     Multiple Vitamins-Minerals (PRESERVISION AREDS 2 PO) Take 1 Capful by mouth daily.     Omega-3 Fatty Acids  (FISH OIL PO) Take by  mouth. (Patient taking differently: Take by mouth daily at 6 (six) AM.)     Cyanocobalamin  (B-12 PO) Take by mouth. (Patient not taking: Reported on 04/13/2024)     No current facility-administered medications on file prior to visit.     PAST SURGICAL HISTORY Past Surgical History:  Procedure Laterality Date   CORONARY ARTERY BYPASS GRAFT N/A 08/29/2021   Procedure: CORONARY ARTERY BYPASS GRAFTING (CABG)x 4 ON CARDIOPULMONARY BYPASS. LIMA TO LAD, SVG TO OM1, SVG TO PDA-OM2 SEQ.;  Surgeon: Kyle Higashi, MD;  Location: [facility] OR;  Service: Open Heart Surgery;  Laterality: N/A;   ENDOVEIN HARVEST OF GREATER SAPHENOUS VEIN Right 08/29/2021   Procedure: ENDOVEIN HARVEST OF GREATER SAPHENOUS VEIN;  Surgeon: Kyle Higashi, MD;  Location: [facility] OR;  Service: Open Heart Surgery;  Laterality: Right;   LEFT HEART CATH AND CORONARY ANGIOGRAPHY N/A 08/23/2021   Procedure: LEFT HEART CATH AND CORONARY ANGIOGRAPHY;  Surgeon: Kyle Rusk, MD;  Location: [facility] INVASIVE CV LAB;  Service: Cardiovascular;  Laterality: N/A;   PROSTATECTOMY  2011   Dr Kyle Strickland, [facility]   TEE WITHOUT CARDIOVERSION N/A 08/29/2021   Procedure: TRANSESOPHAGEAL ECHOCARDIOGRAM (TEE);  Surgeon: Kyle Higashi, MD;  Location: [facility] OR;  Service: Open Heart Surgery;  Laterality: N/A;    FAMILY HISTORY: Family History  Problem Relation Age of Onset   Cancer Maternal Grandfather        bladder   Diabetes Paternal Grandfather        TIAs; CVA   Stroke Paternal Grandfather        early 59s   Heart attack Mother 50   Transient ischemic attack Paternal Uncle    Dementia Father        CVAs   Diabetes Father        borderline   Colon cancer Neg Hx     SOCIAL HISTORY:  reports that he has never smoked. He has never used smokeless tobacco. He reports that he does not currently use alcohol. He reports that he does not use drugs.  PERFORMANCE STATUS: The patient's performance status is 0 - Asymptomatic  PHYSICAL EXAM: Most Recent Vital Signs: Blood pressure (!) 116/50, pulse 70, temperature 99 F (37.2 C), temperature source Oral, resp. rate 18, height 5' 11 (1.803 m), weight 215 lb 1.9 oz (97.6 kg), SpO2 97%. BP (!) 116/50 (BP Location: Left Arm, Patient Position: Sitting, Cuff Size: Normal)   Pulse 70   Temp 99 F (37.2 C) (Oral)   Resp 18   Ht 5' 11 (1.803 m)   Wt 215 lb 1.9 oz (97.6 kg)   SpO2 97%   BMI 30.00 kg/m   General Appearance:    Alert, cooperative, no distress, appears stated age  Head:    Normocephalic, without obvious abnormality, atraumatic  Eyes:    PERRL, conjunctiva/corneas clear, EOM's intact, fundi    benign, both eyes             Throat:   Lips, mucosa, and tongue normal; teeth and gums normal  Neck:    Supple, symmetrical, trachea midline, no adenopathy;       thyroid :  No enlargement/tenderness/nodules; no carotid   bruit or JVD  Back:     Symmetric, no curvature, ROM normal, no CVA tenderness  Lungs:     Clear to auscultation bilaterally, respirations unlabored  Chest wall:    No tenderness or deformity  Heart:    Regular rate and rhythm, S1 and S2 normal, no murmur,  rub   or gallop  Abdomen:     Soft, non-tender, bowel sounds active all four quadrants,    no masses, no organomegaly        Extremities:   Extremities normal, atraumatic, no cyanosis or edema  Pulses:   2+ and symmetric all extremities  Skin:   Skin color, texture, turgor normal, no rashes or lesions  Lymph nodes:   Cervical, supraclavicular, and axillary nodes normal  Neurologic:   CNII-XII intact. Normal strength, sensation and reflexes      throughout    LABORATORY DATA:  Results for orders placed or performed in visit on 04/13/24 (from the past 48 hours)  CBC with Differential (Cancer Center Only)     Status: None   Collection Time: 04/13/24  2:58 PM  Result Value Ref Range   WBC Count 6.7 4.0 - 10.5 K/uL   RBC 4.30 4.22 - 5.81 MIL/uL   Hemoglobin 13.6 13.0 - 17.0 g/dL   HCT 16.1 09.6 - 04.5 %   MCV 92.3 80.0 - 100.0 fL   MCH 31.6 26.0 - 34.0 pg   MCHC 34.3 30.0 - 36.0 g/dL   RDW 40.9 81.1 - 91.4 %   Platelet Count 197 150 - 400 K/uL   nRBC 0.0 0.0 - 0.2 %   Neutrophils Relative % 63 %   Neutro Abs 4.3 1.7 - 7.7 K/uL   Lymphocytes Relative 21 %   Lymphs Abs 1.4 0.7 - 4.0 K/uL   Monocytes Relative 13 %   Monocytes Absolute 0.8 0.1 - 1.0 K/uL   Eosinophils Relative 2 %   Eosinophils Absolute 0.1 0.0 - 0.5 K/uL   Basophils Relative 1 %   Basophils Absolute 0.1 0.0 - 0.1 K/uL   Immature Granulocytes 0 %   Abs Immature Granulocytes 0.01 0.00 - 0.07 K/uL    Comment: Performed at [facility], [address] [place], [place] [address]  Save Smear for Provider Slide Review     Status: None    Collection Time: 04/13/24  2:58 PM  Result Value Ref Range   Smear Review SMEAR STAINED AND AVAILABLE FOR REVIEW     Comment: Performed at [facility], [address] [place], [place] [address]      RADIOGRAPHY: No results found.     PATHOLOGY: None   ASSESSMENT/PLAN: Mr. Diguglielmo is a very pleasant 78 yo caucasian gentleman with at least an 11 year history of intermittent monocytosis. Overall his counts have remained stable and today's CBC with diff is unremarkable.  Blood smear reviewed with Dr. Maria Shiner and no abnormality or evidence of malignancy was noted. His monocytes looked well developed.  Flow cytometry is pending.  Follow-up pending results.   All questions were answered. The patient knows to call the clinic with any problems, questions or concerns. We can certainly see the patient much sooner if necessary.  The patient was discussed with and also seen by Dr. Maria Shiner and he is in agreement with the aforementioned.   Kennard Pea

## 2024-04-14 ENCOUNTER — Other Ambulatory Visit: Payer: Self-pay | Admitting: Internal Medicine

## 2024-04-15 LAB — SURGICAL PATHOLOGY

## 2024-04-16 LAB — FLOW CYTOMETRY

## 2024-04-17 ENCOUNTER — Encounter: Payer: Self-pay | Admitting: Family

## 2024-04-19 ENCOUNTER — Telehealth: Payer: Self-pay | Admitting: Family

## 2024-04-19 NOTE — Telephone Encounter (Signed)
 Flow cytometry reviewed with Dr. Timmy and result is negative. I was able to speak with the patient and let him know. No questions or concerns voiced. No intervention or follow-up needed. Patient appreciative of call.

## 2024-05-31 NOTE — Progress Notes (Unsigned)
 HPI: Follow-up coronary artery disease. Patient presented with non-ST elevation myocardial infarction October 2022. Cardiac catheterization revealed severe three-vessel coronary artery disease and normal LV function. Echocardiogram October 2022 showed normal LV function, mild right ventricular enlargement and mild aortic insufficiency. Carotid Dopplers October 2022 near normal. Patient did have postoperative atrial fibrillation. Follow-up monitor March 2023 showed normal sinus rhythm with rare PAC and PVC but no atrial fibrillation or pauses. Since last seen   Current Outpatient Medications  Medication Sig Dispense Refill   aspirin  EC 81 MG tablet Take 81 mg by mouth every Monday, Tuesday, Wednesday, Thursday, and Friday. In the evening.     atorvastatin  (LIPITOR ) 80 MG tablet TAKE 1 TABLET(80 MG) BY MOUTH DAILY 90 tablet 3   Cyanocobalamin  (B-12 PO) Take by mouth. (Patient not taking: Reported on 04/13/2024)     folic acid  (FOLVITE ) 1 MG tablet Take 1 tablet (1 mg total) by mouth daily. 90 tablet 3   Magnesium  500 MG TABS Take 1 tablet by mouth daily.     metoprolol  succinate (TOPROL -XL) 25 MG 24 hr tablet TAKE 1 TABLET(25 MG) BY MOUTH DAILY 90 tablet 3   Multiple Vitamin (MULTIVITAMINS PO) Take 1 tablet by mouth daily.     Multiple Vitamins-Minerals (PRESERVISION AREDS 2 PO) Take 1 Capful by mouth daily.     Omega-3 Fatty Acids  (FISH OIL PO) Take by mouth. (Patient taking differently: Take by mouth daily at 6 (six) AM.)     No current facility-administered medications for this visit.     Past Medical History:  Diagnosis Date   Elevated blood-pressure reading without diagnosis of hypertension    Other and unspecified hyperlipidemia    Prostate cancer (HCC) 2011   Vitamin D  deficiency     Past Surgical History:  Procedure Laterality Date   CORONARY ARTERY BYPASS GRAFT N/A 08/29/2021   Procedure: CORONARY ARTERY BYPASS GRAFTING (CABG)x 4 ON CARDIOPULMONARY BYPASS. LIMA TO LAD, SVG  TO OM1, SVG TO PDA-OM2 SEQ.;  Surgeon: Kerrin Elspeth BROCKS, MD;  Location: [facility] OR;  Service: Open Heart Surgery;  Laterality: N/A;   ENDOVEIN HARVEST OF GREATER SAPHENOUS VEIN Right 08/29/2021   Procedure: ENDOVEIN HARVEST OF GREATER SAPHENOUS VEIN;  Surgeon: Kerrin Elspeth BROCKS, MD;  Location: [facility] OR;  Service: Open Heart Surgery;  Laterality: Right;   LEFT HEART CATH AND CORONARY ANGIOGRAPHY N/A 08/23/2021   Procedure: LEFT HEART CATH AND CORONARY ANGIOGRAPHY;  Surgeon: Dann Candyce RAMAN, MD;  Location: [facility] INVASIVE CV LAB;  Service: Cardiovascular;  Laterality: N/A;   PROSTATECTOMY  2011   Dr Charles, [facility]   TEE WITHOUT CARDIOVERSION N/A 08/29/2021   Procedure: TRANSESOPHAGEAL ECHOCARDIOGRAM (TEE);  Surgeon: Kerrin Elspeth BROCKS, MD;  Location: [facility] OR;  Service: Open Heart Surgery;  Laterality: N/A;    Social History   Socioeconomic History   Marital status: Married    Spouse name: Not on file   Number of children: 2   Years of education: 14   Highest education level: Associate degree: academic program  Occupational History   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   Tobacco Use   Smoking status: Never   Smokeless tobacco: Never  Substance and Sexual Activity   Alcohol use: Not Currently    Comment: Rarely   Drug use: No   Sexual activity: Not on file  Other Topics Concern   Not on file  Social History Narrative   House hold- pt, wife, dog   Fun: used to ride  [employer], golf  Social Drivers of Corporate investment banker Strain: Low Risk  (11/01/2023)   Overall Financial Resource Strain (CARDIA)    Difficulty of Paying Living Expenses: Not hard at all  Food Insecurity: No Food Insecurity (11/01/2023)   Hunger Vital Sign    Worried About Running Out of Food in the Last Year: Never true    Ran Out of Food in the Last Year: Never true  Transportation Needs: No Transportation Needs (11/01/2023)   PRAPARE - Administrator, Civil Service (Medical): No    Lack of  Transportation (Non-Medical): No  Physical Activity: Insufficiently Active (11/01/2023)   Exercise Vital Sign    Days of Exercise per Week: 3 days    Minutes of Exercise per Session: 10 min  Stress: No Stress Concern Present (11/01/2023)   [employer] of Occupational Health - Occupational Stress Questionnaire    Feeling of Stress : Not at all  Social Connections: Socially Integrated (11/01/2023)   Social Connection and Isolation Panel    Frequency of Communication with Friends and Family: More than three times a week    Frequency of Social Gatherings with Friends and Family: Twice a week    Attends Religious Services: More than 4 times per year    Active Member of [employer] or Organizations: Yes    Attends Engineer, structural: More than 4 times per year    Marital Status: Married  Catering manager Violence: Not At Risk (04/13/2024)   Humiliation, Afraid, Rape, and Kick questionnaire    Fear of Current or Ex-Partner: No    Emotionally Abused: No    Physically Abused: No    Sexually Abused: No    Family History  Problem Relation Age of Onset   Cancer Maternal Grandfather        bladder   Diabetes Paternal Grandfather        TIAs; CVA   Stroke Paternal Grandfather        early 60s   Heart attack Mother 66   Transient ischemic attack Paternal Uncle    Dementia Father        CVAs   Diabetes Father        borderline   Colon cancer Neg Hx     ROS: no fevers or chills, productive cough, hemoptysis, dysphasia, odynophagia, melena, hematochezia, dysuria, hematuria, rash, seizure activity, orthopnea, PND, pedal edema, claudication. Remaining systems are negative.  Physical Exam: Well-developed well-nourished in no acute distress.  Skin is warm and dry.  HEENT is normal.  Neck is supple.  Chest is clear to auscultation with normal expansion.  Cardiovascular exam is regular rate and rhythm.  Abdominal exam nontender or distended. No masses palpated. Extremities show no  edema. neuro grossly intact  ECG- personally reviewed  A/P  1 coronary artery disease-patient denies chest pain.  Continue aspirin  and statin.  2 hypertension-patient's blood pressure is controlled.  Continue present medical regimen.  3 hyperlipidemia-continue statin.  Redell Shallow, MD

## 2024-06-02 ENCOUNTER — Ambulatory Visit: Attending: Cardiology | Admitting: Cardiology

## 2024-06-02 ENCOUNTER — Encounter: Payer: Self-pay | Admitting: Cardiology

## 2024-06-02 VITALS — BP 110/62 | HR 54 | Ht 71.0 in | Wt 215.0 lb

## 2024-06-02 DIAGNOSIS — I10 Essential (primary) hypertension: Secondary | ICD-10-CM | POA: Diagnosis not present

## 2024-06-02 DIAGNOSIS — E782 Mixed hyperlipidemia: Secondary | ICD-10-CM | POA: Diagnosis not present

## 2024-06-02 DIAGNOSIS — I480 Paroxysmal atrial fibrillation: Secondary | ICD-10-CM | POA: Diagnosis not present

## 2024-06-02 NOTE — Patient Instructions (Signed)

## 2024-06-29 DIAGNOSIS — H903 Sensorineural hearing loss, bilateral: Secondary | ICD-10-CM | POA: Diagnosis not present

## 2024-06-30 ENCOUNTER — Ambulatory Visit: Admitting: Cardiology

## 2024-10-10 ENCOUNTER — Encounter: Payer: Self-pay | Admitting: Cardiology

## 2024-10-11 ENCOUNTER — Ambulatory Visit

## 2024-10-11 VITALS — BP 138/60 | HR 50 | Temp 98.6°F | Resp 16 | Ht 71.0 in | Wt 219.4 lb

## 2024-10-11 DIAGNOSIS — Z0000 Encounter for general adult medical examination without abnormal findings: Secondary | ICD-10-CM

## 2024-10-11 MED ORDER — METOPROLOL SUCCINATE ER 25 MG PO TB24: 25.0000 mg | ORAL_TABLET | Freq: Every day | ORAL | 3 refills | Status: AC

## 2024-10-11 MED ORDER — ATORVASTATIN CALCIUM 80 MG PO TABS: 80.0000 mg | ORAL_TABLET | Freq: Every day | ORAL | 3 refills | Status: AC

## 2024-10-11 NOTE — Progress Notes (Signed)
 Please attest this visit in the absence of patient primary care provider.   Chief Complaint  Patient presents with   Medicare Wellness     Subjective:   NTHONY Strickland is a 78 y.o. male who presents for a Medicare Annual Wellness Visit.  Visit info / Clinical Intake: Medicare Wellness Visit Type:: Subsequent Annual Wellness Visit Persons participating in visit and providing information:: patient Medicare Wellness Visit Mode:: In-person (required for WTM) Interpreter Needed?: No Pre-visit prep was completed: yes AWV questionnaire completed by patient prior to visit?: no Living arrangements:: lives with spouse/significant other (and dog) Patient's Overall Health Status Rating: very good Typical amount of pain: some (has arthritis and is worse with cold and some numbness in feet which is being helped by Folic acid ) Does pain affect daily life?: no Are you currently prescribed opioids?: no  Dietary Habits and Nutritional Risks How many meals a day?: 2 Eats fruit and vegetables daily?: yes Most meals are obtained by: preparing own meals In the last 2 weeks, have you had any of the following?: none Diabetic:: no  Functional Status Activities of Daily Living (to include ambulation/medication): Independent Ambulation: Independent Medication Administration: Independent Home Management (perform basic housework or laundry): Independent Manage your own finances?: yes Primary transportation is: driving Concerns about vision?: no *vision screening is required for WTM* (due for eye exam in january with Selinda Reusing) Concerns about hearing?: (!) yes (wears hearing aids and working well) Uses hearing aids?: (!) yes  Fall Screening Falls in the past year?: 0 Number of falls in past year: 0 Was there an injury with Fall?: 0 Fall Risk Category Calculator: 0 Patient Fall Risk Level: Low Fall Risk  Fall Risk Patient at Risk for Falls Due to: Impaired balance/gait (has numbness in feet  but has had no falls) Fall risk Follow up: Falls evaluation completed  Home and Transportation Safety: All rugs have non-skid backing?: yes All stairs or steps have railings?: N/A, no stairs Grab bars in the bathtub or shower?: (!) no Have non-skid surface in bathtub or shower?: yes Good home lighting?: yes Regular seat belt use?: yes Hospital stays in the last year:: no  Cognitive Assessment Difficulty concentrating, remembering, or making decisions? : no Will 6CIT or Mini Cog be Completed: yes What year is it?: 0 points What month is it?: 0 points Give patient an address phrase to remember (5 components): [address], [place]  About what time is it?: 0 points Count backwards from 20 to 1: 0 points Say the months of the year in reverse: 0 points Repeat the address phrase from earlier: 0 points 6 CIT Score: 0 points  Advance Directives (For Healthcare) Does Patient Have a Medical Advance Directive?: Yes Does patient want to make changes to medical advance directive?: No - Patient declined Type of Advance Directive: Healthcare Power of [place]; Living will Copy of Healthcare Power of Attorney in Chart?: No - copy requested Copy of Living Will in Chart?: No - copy requested Would patient like information on creating a medical advance directive?: No - Patient declined  Reviewed/Updated  Reviewed/Updated: Reviewed All (Medical, Surgical, Family, Medications, Allergies, Care Teams, Patient Goals)    Allergies (verified) Ciprofloxacin, Ditropan [oxybutynin chloride], and Codeine   Current Medications (verified) Outpatient Encounter Medications as of 10/11/2024  Medication Sig   aspirin  EC 81 MG tablet Take 81 mg by mouth every Monday, Tuesday, Wednesday, Thursday, and Friday. In the evening.   atorvastatin  (LIPITOR ) 80 MG tablet Take 1 tablet (  80 mg total) by mouth daily.   Cyanocobalamin  (B-12 PO) Take by mouth. (Patient not taking: Reported on 04/13/2024)   folic  acid (FOLVITE ) 1 MG tablet Take 1 tablet (1 mg total) by mouth daily.   Magnesium  500 MG TABS Take 1 tablet by mouth daily.   metoprolol  succinate (TOPROL -XL) 25 MG 24 hr tablet Take 1 tablet (25 mg total) by mouth daily.   Multiple Vitamin (MULTIVITAMINS PO) Take 1 tablet by mouth daily.   Multiple Vitamins-Minerals (PRESERVISION AREDS 2 PO) Take 1 Capful by mouth daily.   Omega-3 Fatty Acids  (FISH OIL PO) Take by mouth. (Patient taking differently: Take by mouth daily at 6 (six) AM.)   No facility-administered encounter medications on file as of 10/11/2024.    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 LIMA TO LAD, SVG TO OM1, SVG TO PDA-OM2 SEQ.;  Surgeon: Kerrin Elspeth BROCKS, MD;  Location: [facility] OR;  Service: Open Heart Surgery;  Laterality: N/A;   ENDOVEIN HARVEST OF GREATER SAPHENOUS VEIN Right 08/29/2021   Procedure: ENDOVEIN HARVEST OF GREATER SAPHENOUS VEIN;  Surgeon: Kerrin Elspeth BROCKS, MD;  Location: [facility] OR;  Service: Open Heart Surgery;  Laterality: Right;   LEFT HEART CATH AND CORONARY ANGIOGRAPHY N/A 08/23/2021   Procedure: LEFT HEART CATH AND CORONARY ANGIOGRAPHY;  Surgeon: Dann Candyce RAMAN, MD;  Location: [facility] INVASIVE CV LAB;  Service: Cardiovascular;  Laterality: N/A;   PROSTATECTOMY  2011   Dr Charles, [facility]   TEE WITHOUT CARDIOVERSION N/A 08/29/2021   Procedure: TRANSESOPHAGEAL ECHOCARDIOGRAM (TEE);  Surgeon: Kerrin Elspeth BROCKS, MD;  Location: [facility] OR;  Service: Open Heart Surgery;  Laterality: N/A;   Family History  Problem Relation Age of Onset   Cancer Maternal Grandfather        bladder   Diabetes Paternal Grandfather         TIAs; CVA   Stroke Paternal Grandfather        early 73s   Heart attack Mother 2   Transient ischemic attack Paternal Uncle    Dementia Father        CVAs   Diabetes Father        borderline   Colon cancer Neg Hx    Social History   Occupational History   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   Tobacco Use   Smoking status: Never   Smokeless tobacco: Never  Substance and Sexual Activity   Alcohol use: Not Currently    Comment: Rarely   Drug use: No   Sexual activity: Not on file   Tobacco Counseling Counseling given: Not Answered  SDOH Screenings   Food Insecurity: No Food Insecurity (10/11/2024)  Housing: Low Risk (10/11/2024)  Transportation Needs: No Transportation Needs (10/11/2024)  Utilities: Not At Risk (10/11/2024)  Alcohol Screen: Low Risk (04/13/2024)  Depression (PHQ2-9): Low Risk (10/11/2024)  Financial Resource Strain: Low Risk (11/01/2023)  Physical Activity: Insufficiently Active (10/11/2024)  Social Connections: Socially Integrated (10/11/2024)  Stress: No Stress Concern Present (10/11/2024)  Tobacco Use: Low Risk (10/11/2024)  Health Literacy: Adequate Health Literacy (04/13/2024)   See flowsheets for full screening details  Depression Screen PHQ 2 & 9 Depression Scale- Over the  past 2 weeks, how often have you been bothered by any of the following problems? Little interest or pleasure in doing things: 0 Feeling down, depressed, or hopeless (PHQ Adolescent also includes...irritable): 0 PHQ-2 Total Score: 0 Trouble falling or staying asleep, or sleeping too much: 0 Feeling tired or having little energy: 0 (stamina is not what it used to be but I am ok) Poor appetite or overeating (PHQ Adolescent also includes...weight loss): 0 Feeling bad about yourself - or that you are a failure or have let yourself or your family down: 0 Trouble concentrating on things, such as reading the newspaper or watching television (PHQ Adolescent also  includes...like school work): 0 Moving or speaking so slowly that other people could have noticed. Or the opposite - being so fidgety or restless that you have been moving around a lot more than usual: 0 Thoughts that you would be better off dead, or of hurting yourself in some way: 0 PHQ-9 Total Score: 0 If you checked off any problems, how difficult have these problems made it for you to do your work, take care of things at home, or get along with other people?: Not difficult at all  Depression Treatment Depression Interventions/Treatment : EYV7-0 Score <4 Follow-up Not Indicated     Goals Addressed             This Visit's Progress    Patient Stated   On track    Maintain healthy diet     Wants to keep weight between 195-200lb               Objective:    Today's Vitals   10/11/24 0850  BP: 138/60  Pulse: (!) 50  Resp: 16  Temp: 98.6 F (37 C)  TempSrc: Oral  SpO2: 98%  Weight: 219 lb 6.4 oz (99.5 kg)  Height: 5' 11 (1.803 m)   Body mass index is 30.6 kg/m.  Hearing/Vision screen No results found. Immunizations and Health Maintenance Health Maintenance  Topic Date Due   Influenza Vaccine  05/28/2024   COVID-19 Vaccine (4 - 2025-26 season) 12/12/2025 (Originally 06/28/2024)   Medicare Annual Wellness (AWV)  10/11/2025   DTaP/Tdap/Td (2 - Td or Tdap) 05/04/2030   Pneumococcal Vaccine: 50+ Years  Completed   Hepatitis C Screening  Completed   Meningococcal B Vaccine  Aged Out   Mammogram  Discontinued   Zoster Vaccines- Shingrix   Discontinued        Assessment/Plan:  This is a routine wellness examination for Nickalous.  Patient Care Team: Amon Aloysius BRAVO, MD as PCP - General (Internal Medicine) Pietro Redell RAMAN, MD as PCP - Cardiology (Cardiology) Tory Kirsch, AUD (Audiology)  I have personally reviewed and noted the following in the patients chart:   Medical and social history Use of alcohol, tobacco or illicit drugs  Current medications and  supplements including opioid prescriptions. Functional ability and status Nutritional status Physical activity Advanced directives List of other physicians Hospitalizations, surgeries, and ER visits in previous 12 months Vitals Screenings to include cognitive, depression, and falls Referrals and appointments  No orders of the defined types were placed in this encounter.  In addition, I have reviewed and discussed with patient certain preventive protocols, quality metrics, and best practice recommendations. A written personalized care plan for preventive services as well as general preventive health recommendations were provided to patient.   Lolita Libra, CMA   10/11/2024   Return in 1 year (on 10/11/2025).  After Visit Summary: (In Person-Printed) AVS  printed and given to the patient  Nurse Notes: Appointment(s) made: (AWV) HM Addressed: Will wait on flu vaccine until he returns from a work trip this week

## 2024-10-11 NOTE — Patient Instructions (Addendum)
 [name],  Thank you for taking the time for your Medicare Wellness Visit. I appreciate your continued commitment to your health goals. Please review the care plan we discussed, and feel free to reach out if I can assist you further.  Please note that Annual Wellness Visits do not include a physical exam. Some assessments may be limited, especially if the visit was conducted virtually. If needed, we may recommend an in-person follow-up with your provider.  Goal:  to maintain a healthy diet and keep weight between 195-200lbs  Ongoing Care Seeing your primary care provider every 3 to 6 months helps us  monitor your health and provide consistent, personalized care.   Dr Amon: 04/06/25 9am Medicare AWV:  10/12/25 9am  Referrals If a referral was made during today's visit and you haven't received any updates within two weeks, please contact the referred provider directly to check on the status.  Recommended Screenings:  You will need to get the following vaccines at your local pharmacy: Flu  Health Maintenance  Topic Date Due   Flu Shot  05/28/2024   COVID-19 Vaccine (4 - 2025-26 season) 12/12/2025*   Medicare Annual Wellness Visit  10/11/2025   DTaP/Tdap/Td vaccine (2 - Td or Tdap) 05/04/2030   Pneumococcal Vaccine for age over 14  Completed   Hepatitis C Screening  Completed   Meningitis B Vaccine  Aged Out   Breast Cancer Screening  Discontinued   Zoster (Shingles) Vaccine  Discontinued  *Topic was postponed. The date shown is not the original due date.       10/11/2024    8:55 AM  Advanced Directives  Does Patient Have a Medical Advance Directive? Yes  Type of Estate Agent of [place];Living will  Does patient want to make changes to medical advance directive? No - Patient declined  Copy of Healthcare Power of Attorney in Chart? No - copy requested  Would patient like information on creating a medical advance directive? No - Patient declined   Please  bring a copy of your health care power of attorney and living will to the office to be added to your chart at your convenience. You can mail a copy to [facility] [address] [place], [place] [address] or email to [email or web address]   Vision: Annual vision screenings are recommended for early detection of glaucoma, cataracts, and diabetic retinopathy. These exams can also reveal signs of chronic conditions such as diabetes and high blood pressure.  Dental: Annual dental screenings help detect early signs of oral cancer, gum disease, and other conditions linked to overall health, including heart disease and diabetes.  Please see the attached documents for additional preventive care recommendations.

## 2025-04-06 ENCOUNTER — Encounter: Admitting: Internal Medicine

## 2025-10-12 ENCOUNTER — Ambulatory Visit
